# Patient Record
Sex: Female | Born: 1985 | Race: White | Hispanic: No | Marital: Married | Smoking: Current every day smoker
Health system: Southern US, Community
[De-identification: ages and names within clinical notes are randomized; demographics above are authoritative.]

## PROBLEM LIST (undated history)

## (undated) ENCOUNTER — Inpatient Hospital Stay (HOSPITAL_COMMUNITY): Payer: Self-pay

## (undated) ENCOUNTER — Emergency Department (HOSPITAL_BASED_OUTPATIENT_CLINIC_OR_DEPARTMENT_OTHER): Admission: EM | Payer: Medicaid Other | Source: Home / Self Care

## (undated) DIAGNOSIS — F319 Bipolar disorder, unspecified: Secondary | ICD-10-CM

## (undated) DIAGNOSIS — R51 Headache: Secondary | ICD-10-CM

## (undated) DIAGNOSIS — Z8632 Personal history of gestational diabetes: Secondary | ICD-10-CM

## (undated) DIAGNOSIS — Z8619 Personal history of other infectious and parasitic diseases: Secondary | ICD-10-CM

## (undated) DIAGNOSIS — O139 Gestational [pregnancy-induced] hypertension without significant proteinuria, unspecified trimester: Secondary | ICD-10-CM

## (undated) DIAGNOSIS — F419 Anxiety disorder, unspecified: Secondary | ICD-10-CM

## (undated) DIAGNOSIS — E669 Obesity, unspecified: Secondary | ICD-10-CM

## (undated) DIAGNOSIS — F32A Depression, unspecified: Secondary | ICD-10-CM

## (undated) DIAGNOSIS — I10 Essential (primary) hypertension: Secondary | ICD-10-CM

## (undated) DIAGNOSIS — N132 Hydronephrosis with renal and ureteral calculous obstruction: Secondary | ICD-10-CM

## (undated) DIAGNOSIS — N390 Urinary tract infection, site not specified: Secondary | ICD-10-CM

## (undated) DIAGNOSIS — N10 Acute tubulo-interstitial nephritis: Secondary | ICD-10-CM

## (undated) DIAGNOSIS — Z8759 Personal history of other complications of pregnancy, childbirth and the puerperium: Secondary | ICD-10-CM

## (undated) DIAGNOSIS — K589 Irritable bowel syndrome without diarrhea: Secondary | ICD-10-CM

## (undated) DIAGNOSIS — A419 Sepsis, unspecified organism: Secondary | ICD-10-CM

## (undated) DIAGNOSIS — F329 Major depressive disorder, single episode, unspecified: Secondary | ICD-10-CM

## (undated) HISTORY — PX: COLONOSCOPY: SHX174

## (undated) HISTORY — PX: TOOTH EXTRACTION: SUR596

## (undated) HISTORY — DX: Gestational (pregnancy-induced) hypertension without significant proteinuria, unspecified trimester: O13.9

## (undated) HISTORY — PX: GASTRIC BYPASS: SHX52

## (undated) HISTORY — DX: Personal history of other infectious and parasitic diseases: Z86.19

## (undated) HISTORY — DX: Essential (primary) hypertension: I10

## (undated) HISTORY — DX: Headache: R51

## (undated) HISTORY — DX: Anxiety disorder, unspecified: F41.9

---
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
## 2009-07-28 ENCOUNTER — Ambulatory Visit: Payer: Self-pay | Admitting: Physician Assistant

## 2009-07-28 ENCOUNTER — Inpatient Hospital Stay (HOSPITAL_COMMUNITY): Admission: AD | Admit: 2009-07-28 | Discharge: 2009-07-28 | Payer: Self-pay | Admitting: Obstetrics and Gynecology

## 2009-09-25 ENCOUNTER — Encounter: Admission: RE | Admit: 2009-09-25 | Discharge: 2009-12-05 | Payer: Self-pay | Admitting: Certified Nurse Midwife

## 2009-10-26 ENCOUNTER — Inpatient Hospital Stay (HOSPITAL_COMMUNITY): Admission: AD | Admit: 2009-10-26 | Discharge: 2009-10-27 | Payer: Self-pay | Admitting: Obstetrics and Gynecology

## 2009-10-26 ENCOUNTER — Ambulatory Visit: Payer: Self-pay | Admitting: Physician Assistant

## 2009-12-15 ENCOUNTER — Inpatient Hospital Stay (HOSPITAL_COMMUNITY): Admission: AD | Admit: 2009-12-15 | Discharge: 2009-12-15 | Payer: Self-pay | Admitting: Obstetrics

## 2009-12-23 ENCOUNTER — Inpatient Hospital Stay (HOSPITAL_COMMUNITY): Admission: AD | Admit: 2009-12-23 | Discharge: 2009-12-25 | Payer: Self-pay | Admitting: Obstetrics

## 2010-05-21 LAB — CBC
HCT: 32.4 % — ABNORMAL LOW (ref 36.0–46.0)
HCT: 38.4 % (ref 36.0–46.0)
Hemoglobin: 11.1 g/dL — ABNORMAL LOW (ref 12.0–15.0)
Hemoglobin: 13.2 g/dL (ref 12.0–15.0)
MCH: 31.2 pg (ref 26.0–34.0)
MCH: 31.5 pg (ref 26.0–34.0)
MCHC: 34.3 g/dL (ref 30.0–36.0)
MCHC: 34.5 g/dL (ref 30.0–36.0)
MCV: 90.5 fL (ref 78.0–100.0)
MCV: 91.7 fL (ref 78.0–100.0)
Platelets: 217 10*3/uL (ref 150–400)
Platelets: 248 10*3/uL (ref 150–400)
RBC: 3.53 MIL/uL — ABNORMAL LOW (ref 3.87–5.11)
RBC: 4.24 MIL/uL (ref 3.87–5.11)
RDW: 14.4 % (ref 11.5–15.5)
RDW: 14.6 % (ref 11.5–15.5)
WBC: 12.9 10*3/uL — ABNORMAL HIGH (ref 4.0–10.5)
WBC: 19.8 10*3/uL — ABNORMAL HIGH (ref 4.0–10.5)

## 2010-05-21 LAB — COMPREHENSIVE METABOLIC PANEL
ALT: 12 U/L (ref 0–35)
ALT: 14 U/L (ref 0–35)
AST: 18 U/L (ref 0–37)
AST: 22 U/L (ref 0–37)
Albumin: 2.1 g/dL — ABNORMAL LOW (ref 3.5–5.2)
Albumin: 2.5 g/dL — ABNORMAL LOW (ref 3.5–5.2)
Alkaline Phosphatase: 108 U/L (ref 39–117)
Alkaline Phosphatase: 136 U/L — ABNORMAL HIGH (ref 39–117)
BUN: 10 mg/dL (ref 6–23)
BUN: 9 mg/dL (ref 6–23)
CO2: 20 mEq/L (ref 19–32)
CO2: 23 mEq/L (ref 19–32)
Calcium: 8.8 mg/dL (ref 8.4–10.5)
Calcium: 9.4 mg/dL (ref 8.4–10.5)
Chloride: 105 mEq/L (ref 96–112)
Chloride: 106 mEq/L (ref 96–112)
Creatinine, Ser: 0.49 mg/dL (ref 0.4–1.2)
Creatinine, Ser: 0.58 mg/dL (ref 0.4–1.2)
GFR calc Af Amer: >60 mL/min (ref >60)
GFR calc Af Amer: >60 mL/min (ref >60)
GFR calc non Af Amer: >60 mL/min (ref >60)
GFR calc non Af Amer: >60 mL/min (ref >60)
Glucose, Bld: 79 mg/dL (ref 70–99)
Glucose, Bld: 92 mg/dL (ref 70–99)
Potassium: 3.9 mEq/L (ref 3.5–5.1)
Potassium: 4.3 mEq/L (ref 3.5–5.1)
Sodium: 135 mEq/L (ref 135–145)
Sodium: 136 mEq/L (ref 135–145)
Total Bilirubin: 0.3 mg/dL (ref 0.3–1.2)
Total Bilirubin: 0.3 mg/dL (ref 0.3–1.2)
Total Protein: 5.3 g/dL — ABNORMAL LOW (ref 6.0–8.3)
Total Protein: 5.9 g/dL — ABNORMAL LOW (ref 6.0–8.3)

## 2010-05-21 LAB — ABO/RH: ABO/RH(D): O POS

## 2010-05-21 LAB — URIC ACID
Uric Acid, Serum: 5.6 mg/dL (ref 2.4–7.0)
Uric Acid, Serum: 5.8 mg/dL (ref 2.4–7.0)

## 2010-05-21 LAB — RPR: RPR Ser Ql: NONREACTIVE

## 2010-05-21 LAB — LACTATE DEHYDROGENASE: LDH: 141 U/L (ref 94–250)

## 2010-05-22 LAB — GLUCOSE, CAPILLARY: Glucose-Capillary: 111 mg/dL — ABNORMAL HIGH (ref 70–99)

## 2010-05-26 LAB — URINALYSIS, ROUTINE W REFLEX MICROSCOPIC
Glucose, UA: NEGATIVE mg/dL
Ketones, ur: NEGATIVE mg/dL
Leukocytes, UA: NEGATIVE
Nitrite: NEGATIVE
Protein, ur: 100 mg/dL — AB
Specific Gravity, Urine: >1.03 — ABNORMAL HIGH (ref 1.005–1.030)
Urobilinogen, UA: 0.2 mg/dL (ref 0.0–1.0)
pH: 5.5 (ref 5.0–8.0)

## 2010-05-26 LAB — URINE MICROSCOPIC-ADD ON

## 2010-05-26 LAB — URINE CULTURE: Colony Count: 75000

## 2011-03-10 NOTE — L&D Delivery Note (Signed)
Delivery Note  Complete dilation at 2339 Onset of pushing at 2340 FHR second stage 150s  Analgesia /Anesthesia intrapartum: none  Delivery of a viable female at 2345 by CNM in LOA position.  Nuchal Cord - none. Cord double clamped after cessation of pulsation, cut by FOB.  Cord blood sample collected.  Placenta delivered spontaneous via shultz  intact with 3 VC.  Placenta for disposal. Uterine tone firm / small bleeding   no laceration identified  Est. Blood Loss (mL): 300 ml  Complications: none  Mom to postpartum.  Baby to Mom for bonding.  [name] CNM, MSN 11/06/2011, 12:03 AM

## 2011-03-23 ENCOUNTER — Encounter (HOSPITAL_COMMUNITY): Payer: Self-pay | Admitting: *Deleted

## 2011-03-23 ENCOUNTER — Inpatient Hospital Stay (HOSPITAL_COMMUNITY)
Admission: AD | Admit: 2011-03-23 | Discharge: 2011-03-24 | Disposition: A | Discharge status: Home / Self Care | Payer: 59 | Source: Ambulatory Visit | Attending: Obstetrics and Gynecology | Admitting: Obstetrics and Gynecology

## 2011-03-23 DIAGNOSIS — O26899 Other specified pregnancy related conditions, unspecified trimester: Secondary | ICD-10-CM

## 2011-03-23 DIAGNOSIS — O99891 Other specified diseases and conditions complicating pregnancy: Secondary | ICD-10-CM | POA: Insufficient documentation

## 2011-03-23 DIAGNOSIS — R1031 Right lower quadrant pain: Secondary | ICD-10-CM | POA: Insufficient documentation

## 2011-03-23 LAB — URINE MICROSCOPIC-ADD ON

## 2011-03-23 LAB — URINALYSIS, ROUTINE W REFLEX MICROSCOPIC
Bilirubin Urine: NEGATIVE
Glucose, UA: NEGATIVE mg/dL
Ketones, ur: NEGATIVE mg/dL
Leukocytes, UA: NEGATIVE
Nitrite: NEGATIVE
Protein, ur: NEGATIVE mg/dL
Specific Gravity, Urine: >1.03 — ABNORMAL HIGH (ref 1.005–1.030)
Urobilinogen, UA: 0.2 mg/dL (ref 0.0–1.0)
pH: 6 (ref 5.0–8.0)

## 2011-03-23 LAB — URINE CULTURE
Colony Count: >=100000
Culture  Setup Time: 201301150323

## 2011-03-23 LAB — HCG, QUANTITATIVE, PREGNANCY: hCG, Beta Chain, Quant, S: 418 m[IU]/mL — ABNORMAL HIGH (ref <5)

## 2011-03-23 NOTE — Progress Notes (Signed)
VE done per CNM.  cx closed.

## 2011-03-23 NOTE — Progress Notes (Signed)
Pt sttes she has pain on the rt lower side of her abd that extends to the back-present x 1 week

## 2011-03-23 NOTE — Progress Notes (Signed)
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

## 2011-03-23 NOTE — ED Notes (Signed)
Family hx of kidney stones

## 2011-03-23 NOTE — Progress Notes (Signed)
Colon Flattery, CNM at bedside.  Assessment done and poc discussed with pt.

## 2011-03-24 NOTE — ED Provider Notes (Signed)
History   Amber Nielsen is a 26 y.o. female G49P1011 with newly diagnosed pregnancy, [redacted]w[redacted]d EGA by LMP, followed with serial quants for hx MAB. Quant today 300+, good rise since previous one at 50.  C/O pain in RLQ, intermittent throughout the day, worse after completing shift at work. Describes pain as coming and going, burning, radiates to back and upper thigh; currently not present. Denies VB/discharge. No urinary S/S. No personal hx of kidney stones, (+) FHx of kidney stones.  She is on Prometrium supplement for low progesterone level.  Chief Complaint  Patient presents with  . Abdominal Pain   HPI  OB History    Grav Para Term Preterm Abortions TAB SAB Ect Mult Living   3 1 1  1  1   1       Past Medical History  Diagnosis Date  . Asthma     Past Surgical History  Procedure Date  . Tooth extraction     No family history on file.  History  Substance Use Topics  . Smoking status: Never Smoker   . Smokeless tobacco: Not on file  . Alcohol Use: No    Allergies:  Allergies  Allergen Reactions  . Celery Oil Swelling    Tongue swelling when eating celery    Prescriptions prior to admission  Medication Sig Dispense Refill  . Prenatal Vit-Fe Fumarate-FA (PRENATAL MULTIVITAMIN) TABS Take 1 tablet by mouth daily.      . progesterone (PROMETRIUM) 200 MG capsule Take 200 mg by mouth daily.        ROS Physical Exam   Blood pressure 112/51, pulse 94, temperature 98.1 F (36.7 C), temperature source Oral, resp. rate 18, height 5\' 10"  (1.778 m), weight 116.121 kg (256 lb), SpO2 99.00%.  Physical Exam  Constitutional: She is oriented to person, place, and time. She appears well-developed and well-nourished. No distress.  HENT:  Head: Normocephalic.  Eyes: Pupils are equal, round, and reactive to light.  Neck: Normal range of motion.  Cardiovascular: Normal rate and regular rhythm.   Respiratory: Effort normal and breath sounds normal.  GI: Soft. Bowel sounds are  normal. She exhibits no distension and no mass. There is no tenderness. There is no rebound and no guarding.  Genitourinary: Vagina normal. Uterus is enlarged (mild). Uterus is not tender. Cervix exhibits no motion tenderness and no discharge. Right adnexum displays no mass. Left adnexum displays no mass. No bleeding around the vagina. No vaginal discharge found.  Musculoskeletal: Normal range of motion.  Neurological: She is alert and oriented to person, place, and time. She has normal reflexes.  Skin: Skin is warm and dry.  Psychiatric: She has a normal mood and affect.    MAU Course  Procedures  Results for orders placed during the hospital encounter of 03/23/11 (from the past 24 hour(s))  URINALYSIS, ROUTINE W REFLEX MICROSCOPIC     Status: Abnormal   Collection Time   03/23/11 10:05 PM      Component Value Range   Color, Urine YELLOW  YELLOW    APPearance CLEAR  CLEAR    Specific Gravity, Urine >1.030 (*) 1.005 - 1.030    pH 6.0  5.0 - 8.0    Glucose, UA NEGATIVE  NEGATIVE (mg/dL)   Hgb urine dipstick TRACE (*) NEGATIVE    Bilirubin Urine NEGATIVE  NEGATIVE    Ketones, ur NEGATIVE  NEGATIVE (mg/dL)   Protein, ur NEGATIVE  NEGATIVE (mg/dL)   Urobilinogen, UA 0.2  0.0 - 1.0 (  mg/dL)   Nitrite NEGATIVE  NEGATIVE    Leukocytes, UA NEGATIVE  NEGATIVE   URINE MICROSCOPIC-ADD ON     Status: Normal   Collection Time   03/23/11 10:05 PM      Component Value Range   Squamous Epithelial / LPF RARE  RARE    RBC / HPF 0-2  <3 (RBC/hpf)   Bacteria, UA RARE  RARE    Urine-Other MUCOUS PRESENT    HCG, QUANTITATIVE, PREGNANCY     Status: Abnormal   Collection Time   03/23/11 11:15 PM      Component Value Range   hCG, Beta Chain, Quant, S 418 (*) <5 (mIU/mL)    Assessment and Plan  IUP at [redacted]w[redacted]d with hx RLQ pain, none elicited at this time. Suspect pain likely d/t CL UA with increased spec gravity and trace hgb, low risk of kidney stones, UCX pending  Will d/c home with SAB precautions,  advised to increase PO fluids, can add Tylenol for discomfort. F/U in office as scheduled, has sono planned for later this week.   PAUL,DANIELA 03/24/2011, 12:01 AM

## 2011-04-15 LAB — OB RESULTS CONSOLE ABO/RH: RH Type: POSITIVE

## 2011-04-15 LAB — OB RESULTS CONSOLE RUBELLA ANTIBODY, IGM: Rubella: UNDETERMINED

## 2011-04-15 LAB — OB RESULTS CONSOLE HEPATITIS B SURFACE ANTIGEN: Hepatitis B Surface Ag: NEGATIVE

## 2011-04-15 LAB — OB RESULTS CONSOLE HIV ANTIBODY (ROUTINE TESTING): HIV: NONREACTIVE

## 2011-04-15 LAB — OB RESULTS CONSOLE ANTIBODY SCREEN: Antibody Screen: NEGATIVE

## 2011-04-25 ENCOUNTER — Inpatient Hospital Stay (HOSPITAL_COMMUNITY)
Admission: AD | Admit: 2011-04-25 | Discharge: 2011-04-26 | Disposition: A | Discharge status: Home / Self Care | Payer: 59 | Source: Ambulatory Visit | Attending: Obstetrics and Gynecology | Admitting: Obstetrics and Gynecology

## 2011-04-25 ENCOUNTER — Encounter (HOSPITAL_COMMUNITY): Payer: Self-pay

## 2011-04-25 DIAGNOSIS — O99891 Other specified diseases and conditions complicating pregnancy: Secondary | ICD-10-CM | POA: Insufficient documentation

## 2011-04-25 DIAGNOSIS — J111 Influenza due to unidentified influenza virus with other respiratory manifestations: Secondary | ICD-10-CM | POA: Insufficient documentation

## 2011-04-25 DIAGNOSIS — O210 Mild hyperemesis gravidarum: Secondary | ICD-10-CM | POA: Insufficient documentation

## 2011-04-25 MED ORDER — ACETAMINOPHEN 500 MG PO TABS
1000.0000 mg | ORAL_TABLET | Freq: Four times a day (QID) | ORAL | Status: DC | PRN
  Administered 2011-04-26: 1000 mg via ORAL
  Filled 2011-04-25: qty 2

## 2011-04-25 MED ORDER — OSELTAMIVIR PHOSPHATE 75 MG PO CAPS
75.0000 mg | ORAL_CAPSULE | Freq: Two times a day (BID) | ORAL | Status: DC
  Administered 2011-04-26: 75 mg via ORAL
  Filled 2011-04-25 (×2): qty 1

## 2011-04-25 MED ORDER — ONDANSETRON 8 MG PO TBDP
8.0000 mg | ORAL_TABLET | Freq: Three times a day (TID) | ORAL | Status: DC | PRN
Start: 2011-04-25 — End: 2011-04-26
  Filled 2011-04-25: qty 1

## 2011-04-25 NOTE — Progress Notes (Signed)
Patient is here with c/o lower back pain, sore throat, vomiting and fever. She denies any vaginal bleeding or discharge

## 2011-04-25 NOTE — ED Provider Notes (Signed)
History   Amber Nielsen is a 26 y.o. G3P1011 at [redacted]w[redacted]d , presenting w/ c/o body aches, fever, nausea and emesis since this am. No cough.  Has not taken any OTC meds. Reports spouse also sick at home.  Denies VB/LOF.  Chief Complaint  Patient presents with  . Sore Throat  . Emesis  . Fever     OB History    Grav Para Term Preterm Abortions TAB SAB Ect Mult Living   3 1 1  1  1   1       Past Medical History  Diagnosis Date  . Asthma     allergy related. rodent animals    Past Surgical History  Procedure Date  . Tooth extraction     History reviewed. No pertinent family history.  History  Substance Use Topics  . Smoking status: Never Smoker   . Smokeless tobacco: Not on file  . Alcohol Use: No    Allergies:  Allergies  Allergen Reactions  . Celery Oil Swelling    Tongue swelling when eating celery    Prescriptions prior to admission  Medication Sig Dispense Refill  . Prenatal Vit-Fe Fumarate-FA (PRENATAL MULTIVITAMIN) TABS Take 1 tablet by mouth daily.      . progesterone (PROMETRIUM) 200 MG capsule Take 200 mg by mouth daily.        ROS as noted Physical Exam   Gen: AAO x 3, NAD, flushed Skin: flushed, moist mucous membranes CV: RRR, sinus tachy 110 Pulm; CTAB Abd: NT, soft Pelvic: deferred Ext: no edema  FHT 180 by Doppler  Blood pressure 119/60, pulse 115, temperature 101.1 F (38.4 C), temperature source Oral, resp. rate 20, height 5' 10.5" (1.791 m), weight 255 lb 8 oz (115.894 kg).    ED Course   IUP at [redacted]w[redacted]d  Flu-like symptoms, will treat presumptively Flu swab pending, will result on Monday  Start Tamiflu 75 mg PO BID x 5 days Zofran and Tylenol PRN Will D/C home if able to tolerate PO fluids and fever down.   Amber Nielsen 04/26/2011 12:05 AM

## 2011-04-25 NOTE — Progress Notes (Signed)
Pt states, " The sore throat started yesterday and this morning I felt nauseated but I went to work and then I started vomiting. I've been cramping all day in my upper abdomen. I took my temp a couple hours ago and it was 101."

## 2011-04-26 LAB — INFLUENZA PANEL BY PCR (TYPE A & B)
H1N1 flu by pcr: NOT DETECTED
Influenza A By PCR: NEGATIVE
Influenza B By PCR: NEGATIVE

## 2011-04-26 MED ORDER — ACETAMINOPHEN 500 MG PO TABS: 1000.0000 mg | ORAL_TABLET | Freq: Four times a day (QID) | ORAL | Status: AC | PRN

## 2011-04-26 MED ORDER — OSELTAMIVIR PHOSPHATE 75 MG PO CAPS: 75.0000 mg | ORAL_CAPSULE | Freq: Two times a day (BID) | ORAL | Status: AC

## 2011-04-26 MED ORDER — ONDANSETRON 8 MG PO TBDP: 8.0000 mg | ORAL_TABLET | Freq: Three times a day (TID) | ORAL | Status: AC | PRN

## 2011-07-16 ENCOUNTER — Encounter (HOSPITAL_COMMUNITY): Payer: Self-pay | Admitting: *Deleted

## 2011-07-16 ENCOUNTER — Inpatient Hospital Stay (HOSPITAL_COMMUNITY)
Admission: AD | Admit: 2011-07-16 | Discharge: 2011-07-16 | Disposition: A | Discharge status: Home / Self Care | Payer: No Typology Code available for payment source | Source: Ambulatory Visit | Attending: Obstetrics | Admitting: Obstetrics

## 2011-07-16 DIAGNOSIS — M25519 Pain in unspecified shoulder: Secondary | ICD-10-CM | POA: Insufficient documentation

## 2011-07-16 DIAGNOSIS — O99891 Other specified diseases and conditions complicating pregnancy: Secondary | ICD-10-CM | POA: Insufficient documentation

## 2011-07-16 MED ORDER — ACETAMINOPHEN 500 MG PO TABS
1000.0000 mg | ORAL_TABLET | Freq: Four times a day (QID) | ORAL | Status: DC | PRN
  Administered 2011-07-16: 1000 mg via ORAL
  Filled 2011-07-16: qty 2

## 2011-07-16 MED ORDER — ACETAMINOPHEN 500 MG PO TABS: 1000.0000 mg | ORAL_TABLET | Freq: Four times a day (QID) | ORAL | Status: AC | PRN
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
## 2011-08-15 ENCOUNTER — Inpatient Hospital Stay (HOSPITAL_COMMUNITY): Payer: 59

## 2011-08-15 ENCOUNTER — Encounter (HOSPITAL_COMMUNITY): Payer: Self-pay | Admitting: *Deleted

## 2011-08-15 ENCOUNTER — Inpatient Hospital Stay (HOSPITAL_COMMUNITY)
Admission: AD | Admit: 2011-08-15 | Discharge: 2011-08-15 | Disposition: A | Discharge status: Home / Self Care | Payer: 59 | Source: Ambulatory Visit | Attending: Obstetrics and Gynecology | Admitting: Obstetrics and Gynecology

## 2011-08-15 DIAGNOSIS — O4690 Antepartum hemorrhage, unspecified, unspecified trimester: Secondary | ICD-10-CM | POA: Insufficient documentation

## 2011-08-15 LAB — WET PREP, GENITAL
Clue Cells Wet Prep HPF POC: NONE SEEN
Trich, Wet Prep: NONE SEEN
Yeast Wet Prep HPF POC: NONE SEEN

## 2011-08-15 LAB — URINALYSIS, ROUTINE W REFLEX MICROSCOPIC
Bilirubin Urine: NEGATIVE
Glucose, UA: NEGATIVE mg/dL
Hgb urine dipstick: NEGATIVE
Ketones, ur: NEGATIVE mg/dL
Nitrite: NEGATIVE
Protein, ur: NEGATIVE mg/dL
Specific Gravity, Urine: 1.02 (ref 1.005–1.030)
Urobilinogen, UA: 0.2 mg/dL (ref 0.0–1.0)
pH: 6 (ref 5.0–8.0)

## 2011-08-15 LAB — URINE MICROSCOPIC-ADD ON

## 2011-08-15 NOTE — MAU Note (Signed)
Pt states she has had bleeding this am, describes it as bloody mucus when she wipes. Denies any cramping.

## 2011-08-15 NOTE — MAU Note (Signed)
Patient reports when wiped after using the bathroom had brown red blood on tissue having a lot of discharge [redacted] weeks gestation

## 2011-08-15 NOTE — MAU Provider Note (Signed)
OB ADMISSION/ HISTORY & PHYSICAL:  Admission Date: 08/15/2011 12:54 PM  Admit Diagnosis: Intrauterine pregnancy at 25 3/7 weeks Vaginal bleed   Amber Nielsen is a 27 y.o. female presenting for eval of vaginal bleed. Reports bright red and brown mucous DC on toilet paper this AM after void. Mild back pain since yesterday. Denies LOF / N/V/ abdominal pain. Denies urinary s/s. No hemorrhoids. No IC last 24 hours. No further spotting since first episode.  Prenatal History: G3P1011   EDC : 11/25/2011, by Other B[facility]  Prenatal care at [facility] since first trimester, Fredric Mare, CNM primary.  Prenatal course complicated by failed 1GTT with hx of GDM, now on BS monitor / FBS and one 2 hr PP daily Obesity, gallbladder colic intermittent    Medical / Surgical History :  Past medical history:  Past Medical History  Diagnosis Date  . Asthma     allergy related. rodent animals     Past surgical history:  Past Surgical History  Procedure Date  . Tooth extraction      Family History:  Family History  Problem Relation Age of Onset  . Heart disease Father      Social History:  reports that she has never smoked. She does not have any smokeless tobacco history on file. She reports that she does not drink alcohol or use illicit drugs.   Allergies: Celery oil    Current Medications at time of admission:  Prescriptions prior to admission  Medication Sig Dispense Refill  . OVER THE COUNTER MEDICATION Take 1 tablet by mouth 2 (two) times daily. Patient takes Pepcid AC      . Prenatal Vit-Fe Fumarate-FA (PRENATAL MULTIVITAMIN) TABS Take 1 tablet by mouth daily.          Review of Systems: As noted in HPI.   Physical Exam:    Filed Vitals:   08/15/11 1302 08/15/11 1550  BP: 117/66 124/72  Pulse: 97 82  Temp: 98.5 F (36.9 C)   TempSrc: Oral   Resp:  18  Height: 5\' 10"  (1.778 m)   Weight: 125.193 kg (276 lb)     General: AAO x3, NAD  Abdomen: obese,  non tender, gravid Extremities: trace pedal edema Genitalia / VE: SSE, no blood in vaginal vault, cervix appears normal, closed, ectropion +, leukorrhea DC  FHR: 130, moderate variability, no decel's TOCO: no ctx   Labs:    Results for orders placed during the hospital encounter of 08/15/11 (from the past 24 hour(s))  URINALYSIS, ROUTINE W REFLEX MICROSCOPIC     Status: Abnormal   Collection Time   08/15/11  1:05 PM      Component Value Range   Color, Urine YELLOW  YELLOW    APPearance HAZY (*) CLEAR    Specific Gravity, Urine 1.020  1.005 - 1.030    pH 6.0  5.0 - 8.0    Glucose, UA NEGATIVE  NEGATIVE (mg/dL)   Hgb urine dipstick NEGATIVE  NEGATIVE    Bilirubin Urine NEGATIVE  NEGATIVE    Ketones, ur NEGATIVE  NEGATIVE (mg/dL)   Protein, ur NEGATIVE  NEGATIVE (mg/dL)   Urobilinogen, UA 0.2  0.0 - 1.0 (mg/dL)   Nitrite NEGATIVE  NEGATIVE    Leukocytes, UA TRACE (*) NEGATIVE   URINE MICROSCOPIC-ADD ON     Status: Abnormal   Collection Time   08/15/11  1:05 PM      Component Value Range   Squamous Epithelial / LPF MANY (*) RARE  WBC, UA 3-6  <3 (WBC/hpf)   RBC / HPF 0-2  <3 (RBC/hpf)   Bacteria, UA FEW (*) RARE   WET PREP, GENITAL     Status: Abnormal   Collection Time   08/15/11  3:06 PM      Component Value Range   Yeast Wet Prep HPF POC NONE SEEN  NONE SEEN    Trich, Wet Prep NONE SEEN  NONE SEEN    Clue Cells Wet Prep HPF POC NONE SEEN  NONE SEEN    WBC, Wet Prep HPF POC FEW (*) NONE SEEN     Sono for cervical length 4.3, cephalic, nl AFi  Assessment: IUP at 25 3/7 weeks Reassuring FHT's, FHR tracing reassuring for gestational age No active vaginal bleed Nl cervical length on sono No evidence of vaginosis  Plan:  Will DC home w/ precautions F/U as scheduled in office.  Deniz Hannan 08/15/2011, 3:44 PM

## 2011-10-18 ENCOUNTER — Inpatient Hospital Stay (HOSPITAL_COMMUNITY)
Admission: AD | Admit: 2011-10-18 | Discharge: 2011-10-18 | Disposition: A | Discharge status: Home / Self Care | Payer: 59 | Source: Ambulatory Visit | Attending: Obstetrics | Admitting: Obstetrics

## 2011-10-18 ENCOUNTER — Encounter (HOSPITAL_COMMUNITY): Payer: Self-pay | Admitting: Obstetrics and Gynecology

## 2011-10-18 ENCOUNTER — Emergency Department (HOSPITAL_COMMUNITY)
Admission: EM | Admit: 2011-10-18 | Discharge: 2011-10-19 | Disposition: A | Discharge status: Home / Self Care | Payer: 59 | Attending: Emergency Medicine | Admitting: Emergency Medicine

## 2011-10-18 ENCOUNTER — Encounter (HOSPITAL_COMMUNITY): Payer: Self-pay | Admitting: Emergency Medicine

## 2011-10-18 DIAGNOSIS — O99810 Abnormal glucose complicating pregnancy: Secondary | ICD-10-CM | POA: Insufficient documentation

## 2011-10-18 DIAGNOSIS — M79622 Pain in left upper arm: Secondary | ICD-10-CM

## 2011-10-18 DIAGNOSIS — O99891 Other specified diseases and conditions complicating pregnancy: Secondary | ICD-10-CM | POA: Insufficient documentation

## 2011-10-18 DIAGNOSIS — M79609 Pain in unspecified limb: Secondary | ICD-10-CM | POA: Insufficient documentation

## 2011-10-18 DIAGNOSIS — N898 Other specified noninflammatory disorders of vagina: Secondary | ICD-10-CM

## 2011-10-18 DIAGNOSIS — E669 Obesity, unspecified: Secondary | ICD-10-CM | POA: Insufficient documentation

## 2011-10-18 DIAGNOSIS — E876 Hypokalemia: Secondary | ICD-10-CM

## 2011-10-18 HISTORY — DX: Major depressive disorder, single episode, unspecified: F32.9

## 2011-10-18 HISTORY — DX: Depression, unspecified: F32.A

## 2011-10-18 HISTORY — DX: Obesity, unspecified: E66.9

## 2011-10-18 LAB — WET PREP, GENITAL
Clue Cells Wet Prep HPF POC: NONE SEEN
Trich, Wet Prep: NONE SEEN
Yeast Wet Prep HPF POC: NONE SEEN

## 2011-10-18 LAB — CBC WITH DIFFERENTIAL/PLATELET
Basophils Absolute: 0 10*3/uL (ref 0.0–0.1)
Basophils Relative: 0 % (ref 0–1)
Eosinophils Absolute: 0 10*3/uL (ref 0.0–0.7)
Eosinophils Relative: 0 % (ref 0–5)
HCT: 37.7 % (ref 36.0–46.0)
Hemoglobin: 12.7 g/dL (ref 12.0–15.0)
Lymphocytes Relative: 16 % (ref 12–46)
Lymphs Abs: 2.3 10*3/uL (ref 0.7–4.0)
MCH: 30.2 pg (ref 26.0–34.0)
MCHC: 33.7 g/dL (ref 30.0–36.0)
MCV: 89.8 fL (ref 78.0–100.0)
Monocytes Absolute: 0.9 10*3/uL (ref 0.1–1.0)
Monocytes Relative: 6 % (ref 3–12)
Neutro Abs: 11 10*3/uL — ABNORMAL HIGH (ref 1.7–7.7)
Neutrophils Relative %: 77 % (ref 43–77)
Platelets: 278 10*3/uL (ref 150–400)
RBC: 4.2 MIL/uL (ref 3.87–5.11)
RDW: 13.6 % (ref 11.5–15.5)
WBC: 14.3 10*3/uL — ABNORMAL HIGH (ref 4.0–10.5)

## 2011-10-18 LAB — AMNISURE RUPTURE OF MEMBRANE (ROM) NOT AT ARMC: Amnisure ROM: NEGATIVE

## 2011-10-18 NOTE — MAU Provider Note (Signed)
OB ADMISSION/ HISTORY & PHYSICAL:  Admission Date: 10/18/2011 11:56 AM  Admit Diagnosis: Intrauterine pregnancy at [redacted]w[redacted]d  Vaginal discharge   Amber Nielsen is a 26 y.o. female presenting for eval of vaginal discharge. Increased wetness for past 2 days, reports soaking a panty liner yesterday, increased DC when whipping since then.  Good fetal movement, no VB. Denies urinary s/s Contractions decreased and only occasional since OOW.  Prenatal History: G3P1011   EDC : 11/25/2011, by Other Basis  Prenatal care at [facility] since first trimester, Fredric Mare, CNM primary.  Prenatal course complicated by:  GDM A2 on Metformin and glyburide Preterm cervical change on modified bed rest and Procardia Anxiety controlled w/ Zoloft and BuSpar Obesity, gallbladder colic intermittent    Medical / Surgical History :  Past medical history:  Past Medical History  Diagnosis Date  . Asthma     allergy related. rodent animals  . Obesity   . Gestational diabetes      Past surgical history:  Past Surgical History  Procedure Date  . Tooth extraction      Family History:  Family History  Problem Relation Age of Onset  . Heart disease Father   . Heart attack Father   . Nephrolithiasis Father   . Mental retardation Maternal Aunt   . Diabetes Maternal Grandmother   . Cancer Maternal Grandmother   . Diabetes Maternal Grandfather   . Diabetes Paternal Grandmother   . COPD Paternal Grandmother   . Diabetes Paternal Grandfather      Social History:  reports that she has never smoked. She does not have any smokeless tobacco history on file. She reports that she does not drink alcohol or use illicit drugs.   Allergies: Celery oil    Current Medications at time of admission:  Prescriptions prior to admission  Medication Sig Dispense Refill  . busPIRone (BUSPAR) 5 MG tablet Take 5 mg by mouth every 6 (six) hours as needed. For anxiety, can also take 2 tablets at  bedtime if needed      . famotidine (PEPCID AC MAXIMUM STRENGTH) 20 MG tablet Take 20 mg by mouth every morning.      . glyBURIDE-metformin (GLUCOVANCE) 1.25-250 MG per tablet Take 1 tablet by mouth 2 (two) times daily with a meal.      . NIFEdipine (PROCARDIA-XL/ADALAT-CC/NIFEDICAL-XL) 30 MG 24 hr tablet Take 30 mg by mouth daily.      . Prenatal Vit-Fe Fumarate-FA (PRENATAL MULTIVITAMIN) TABS Take 1 tablet by mouth daily.      . sertraline (ZOLOFT) 50 MG tablet Take 50 mg by mouth daily.          Review of Systems: As noted in HPI.   Physical Exam:  Dilation: 2 Effacement (%): 40 Station: -2 Exam by:: Arlan Organ, CNM Filed Vitals:   10/18/11 1206  BP: 134/75  Pulse: 88  Temp: 97.9 F (36.6 C)  TempSrc: Oral  Resp: 18  Height: 5\' 10"  (1.778 m)  Weight: 131.634 kg (290 lb 3.2 oz)    General: AAO x3, NAD  Abdomen: obese, non tender, gravid Extremities: trace pedal edema Genitalia / VE:  leukorrhea DC, amnisure neg, wet prep neg cvx unchanged since last check  FHR: 130, moderate variability, no decel's TOCO: no ctx   Labs:    Results for orders placed during the hospital encounter of 10/18/11 (from the past 24 hour(s))  WET PREP, GENITAL     Status: Abnormal   Collection Time  10/18/11 12:50 PM      Component Value Range   Yeast Wet Prep HPF POC NONE SEEN  NONE SEEN   Trich, Wet Prep NONE SEEN  NONE SEEN   Clue Cells Wet Prep HPF POC NONE SEEN  NONE SEEN   WBC, Wet Prep HPF POC FEW (*) NONE SEEN  AMNISURE RUPTURE OF MEMBRANE (ROM)     Status: Normal   Collection Time   10/18/11 12:50 PM      Component Value Range   Amnisure ROM NEGATIVE      Assessment: IUP at [redacted]w[redacted]d, normal leukorrhea  Reassuring FHT's, FHR reactive No evidence of ROM No evidence of vaginosis   Plan:  Will DC home w/ precautions F/U as scheduled in office.  Lucas Exline 10/18/2011, 1:55 PM

## 2011-10-18 NOTE — ED Provider Notes (Signed)
History     CSN: [id number]  Arrival date & time 10/18/11  2220   First MD Initiated Contact with Patient 10/18/11 2259      Chief Complaint  Patient presents with  . Chest Pain    (Consider location/radiation/quality/duration/timing/severity/associated sxs/prior treatment) HPI 26 year old female presents to emergency department complaining of 2 days of intermittent sharp pain to her left arm, neck and occasionally to her chest. Patient reports initially pain was just in her left upper arm, pain lasts anywhere from a few seconds to 30 minutes. Patient reports pain is a sharp crampy pain. She denies shortness breath associated with the pain, but has had shortness of breath over the last week which she is associated with her expanding belly and being pregnant. Patient has been taking Tylenol for the pain. Tonight she called the nurse line who recommended that she be seen in the emergency department for possible heart attack. Patient reports her father had a heart attack in his 74s related to painting a house and taking Tylenol cold medicine. He did not require a stent for his heart attack. Patient is a G3 P2 at 34 weeks. She has gestational diabetes. She was started on Procardia 3 weeks ago do to frequent contractions. Patient was seen earlier today which was hospital for vaginal discharge, at that time she did not complain of arm or chest pain.  Past Medical History  Diagnosis Date  . Asthma     allergy related. rodent animals  . Obesity   . Gestational diabetes   . Depression     Past Surgical History  Procedure Date  . Tooth extraction     Family History  Problem Relation Age of Onset  . Heart disease Father   . Heart attack Father   . Nephrolithiasis Father   . Mental retardation Maternal Aunt   . Diabetes Maternal Grandmother   . Cancer Maternal Grandmother   . Diabetes Maternal Grandfather   . Diabetes Paternal Grandmother   . COPD Paternal Grandmother   . Diabetes  Paternal Grandfather     History  Substance Use Topics  . Smoking status: Never Smoker   . Smokeless tobacco: Not on file  . Alcohol Use: No    OB History    Grav Para Term Preterm Abortions TAB SAB Ect Mult Living   3 1 1  1  1   1       Review of Systems  All other systems reviewed and are negative.    Allergies  Celery oil  Home Medications   Current Outpatient Rx  Name Route Sig Dispense Refill  . BUSPIRONE HCL 5 MG PO TABS Oral Take 5 mg by mouth every 6 (six) hours as needed. For anxiety, can also take 2 tablets at bedtime if needed    . FAMOTIDINE 20 MG PO TABS Oral Take 20 mg by mouth every morning.    Marland Kitchen GLYBURIDE-METFORMIN 1.25-250 MG PO TABS Oral Take 1 tablet by mouth 2 (two) times daily with a meal.    . NIFEDIPINE ER OSMOTIC 30 MG PO TB24 Oral Take 30 mg by mouth daily.    Marland Kitchen PRENATAL MULTIVITAMIN CH Oral Take 1 tablet by mouth daily.    . SERTRALINE HCL 50 MG PO TABS Oral Take 50 mg by mouth daily.      BP 129/88  Pulse 84  Temp 97.6 F (36.4 C) (Oral)  Resp 30  SpO2 97%  Physical Exam  Nursing note and vitals reviewed. Constitutional: She  is oriented to person, place, and time. She appears well-developed and well-nourished.  HENT:  Head: Normocephalic and atraumatic.  Nose: Nose normal.  Mouth/Throat: Oropharynx is clear and moist.  Eyes: Conjunctivae and EOM are normal. Pupils are equal, round, and reactive to light.  Neck: Normal range of motion. Neck supple. No JVD present. No tracheal deviation present. No thyromegaly present.  Cardiovascular: Normal rate, regular rhythm, normal heart sounds and intact distal pulses.  Exam reveals no gallop and no friction rub.   No murmur heard. Pulmonary/Chest: Effort normal and breath sounds normal. No stridor. No respiratory distress. She has no wheezes. She has no rales. She exhibits no tenderness.  Abdominal: Soft. Bowel sounds are normal. She exhibits no distension and no mass. There is no tenderness.  There is no rebound and no guarding.  Musculoskeletal: Normal range of motion. She exhibits tenderness (tender to palpation over left trapezius, deltoid, reproduces pain). She exhibits no edema.  Lymphadenopathy:    She has no cervical adenopathy.  Neurological: She is alert and oriented to person, place, and time. She exhibits normal muscle tone. Coordination normal.  Skin: Skin is warm and dry. No rash noted. No erythema. No pallor.  Psychiatric: She has a normal mood and affect. Her behavior is normal. Judgment and thought content normal.    ED Course  Procedures (including critical care time)  Labs Reviewed  CBC WITH DIFFERENTIAL - Abnormal; Notable for the following:    WBC 14.3 (*)     Neutro Abs 11.0 (*)     All other components within normal limits  BASIC METABOLIC PANEL - Abnormal; Notable for the following:    Sodium 134 (*)     Potassium 3.4 (*)     Glucose, Bld 127 (*)     Creatinine, Ser 0.43 (*)     All other components within normal limits  TROPONIN I    Date: 10/19/2011  Rate: 97   Rhythm: normal sinus rhythm and sinus arrhythmia  QRS Axis: normal  Intervals: normal  ST/T Wave abnormalities: normal  Conduction Disutrbances:none  Narrative Interpretation:   Old EKG Reviewed: none available    1. Left upper arm pain   2. Hypokalemia       MDM  26 yo female with left sided arm, shoulder pain.  Do not feels sxs are due to PE, ACS.  Will check baseline labs given family history of early acs.        Olivia Mackie, MD 10/19/11 [phone]

## 2011-10-18 NOTE — MAU Note (Signed)
"  I was seen this week in the office and had this clear watery d/c.  I wear pads all the time.  I have started soaking the Poise pads that I wear in about an hour since yesterday.  I have UC's every once in a while, but it's hard for me to tell while taking Procardia.  They get worse and tighter at night that it's just ridiculous.  (+) FM"

## 2011-10-18 NOTE — ED Notes (Signed)
Pt c/o intermittent left arm pain X 2 days, left neck and shoulder pain that started yesterday. Pt reports she felt nauseous today laid down then it went away, when she woke up pt started experiencing some intermittent left hand numbness. Pt reports when she gets the arm pain she sometimes get left sided under breast chest pain.

## 2011-10-18 NOTE — MAU Note (Signed)
Pt reports she is leaking clear fluid for several days and increased yesterday. Having occasional mild contractions and reports good fetal movment

## 2011-10-18 NOTE — ED Notes (Addendum)
Pt presents w/ left arm/shoulder pain x2 days - pain is reproducible to palpation. Pt concerned d/t an episode of numbness to left hand today - pt is  [redacted] wks pregnant G3P1. Pt in no acute distress at present, states pain in arm has improved since being in dept.

## 2011-10-18 NOTE — ED Notes (Addendum)
Pt is [redacted] weeks pregnant.  Reports intermittent pain in L upper arm, L shoulder, L side of neck, L chest and L upper back x 2 days. Denies any other symptoms.  Increased concern due to father having MI in his 11s.

## 2011-10-19 LAB — TROPONIN I: Troponin I: <0.3 ng/mL (ref <0.30)

## 2011-10-19 LAB — BASIC METABOLIC PANEL
BUN: 8 mg/dL (ref 6–23)
CO2: 21 mEq/L (ref 19–32)
Calcium: 9.7 mg/dL (ref 8.4–10.5)
Chloride: 98 mEq/L (ref 96–112)
Creatinine, Ser: 0.43 mg/dL — ABNORMAL LOW (ref 0.50–1.10)
GFR calc Af Amer: >90 mL/min (ref >90)
GFR calc non Af Amer: >90 mL/min (ref >90)
Glucose, Bld: 127 mg/dL — ABNORMAL HIGH (ref 70–99)
Potassium: 3.4 mEq/L — ABNORMAL LOW (ref 3.5–5.1)
Sodium: 134 mEq/L — ABNORMAL LOW (ref 135–145)

## 2011-10-19 NOTE — ED Notes (Signed)
Pt ambulating independently w/ steady gait on d/c in no acute distress, A&Ox4. D/c instructions reviewed w/ pt - pt denies any further questions or concerns at present.  

## 2011-10-20 LAB — OB RESULTS CONSOLE RPR: RPR: NONREACTIVE

## 2011-10-21 LAB — OB RESULTS CONSOLE GC/CHLAMYDIA
Chlamydia: NEGATIVE
Gonorrhea: NEGATIVE

## 2011-10-21 LAB — OB RESULTS CONSOLE GBS: GBS: NEGATIVE

## 2011-11-02 ENCOUNTER — Telehealth (HOSPITAL_COMMUNITY): Payer: Self-pay | Admitting: *Deleted

## 2011-11-02 ENCOUNTER — Encounter (HOSPITAL_COMMUNITY): Payer: Self-pay | Admitting: *Deleted

## 2011-11-02 NOTE — Telephone Encounter (Signed)
Preadmission screen  

## 2011-11-05 ENCOUNTER — Inpatient Hospital Stay (HOSPITAL_COMMUNITY)
Admission: AD | Admit: 2011-11-05 | Discharge: 2011-11-07 | DRG: 774 | Disposition: A | Discharge status: Home / Self Care | Payer: 59 | Source: Ambulatory Visit | Attending: Obstetrics | Admitting: Obstetrics

## 2011-11-05 ENCOUNTER — Encounter (HOSPITAL_COMMUNITY): Payer: Self-pay | Admitting: *Deleted

## 2011-11-05 DIAGNOSIS — F329 Major depressive disorder, single episode, unspecified: Secondary | ICD-10-CM | POA: Diagnosis present

## 2011-11-05 DIAGNOSIS — O99344 Other mental disorders complicating childbirth: Secondary | ICD-10-CM | POA: Diagnosis present

## 2011-11-05 DIAGNOSIS — O99814 Abnormal glucose complicating childbirth: Secondary | ICD-10-CM | POA: Diagnosis present

## 2011-11-05 DIAGNOSIS — O3660X0 Maternal care for excessive fetal growth, unspecified trimester, not applicable or unspecified: Secondary | ICD-10-CM | POA: Diagnosis present

## 2011-11-05 DIAGNOSIS — O1400 Mild to moderate pre-eclampsia, unspecified trimester: Secondary | ICD-10-CM

## 2011-11-05 DIAGNOSIS — F3289 Other specified depressive episodes: Secondary | ICD-10-CM | POA: Diagnosis present

## 2011-11-05 DIAGNOSIS — IMO0002 Reserved for concepts with insufficient information to code with codable children: Principal | ICD-10-CM | POA: Diagnosis present

## 2011-11-05 DIAGNOSIS — O24419 Gestational diabetes mellitus in pregnancy, unspecified control: Secondary | ICD-10-CM

## 2011-11-05 DIAGNOSIS — E669 Obesity, unspecified: Secondary | ICD-10-CM | POA: Diagnosis present

## 2011-11-05 LAB — COMPREHENSIVE METABOLIC PANEL
ALT: 26 U/L (ref 0–35)
AST: 24 U/L (ref 0–37)
Albumin: 2.4 g/dL — ABNORMAL LOW (ref 3.5–5.2)
Alkaline Phosphatase: 153 U/L — ABNORMAL HIGH (ref 39–117)
BUN: 10 mg/dL (ref 6–23)
CO2: 21 mEq/L (ref 19–32)
Calcium: 9.5 mg/dL (ref 8.4–10.5)
Chloride: 99 mEq/L (ref 96–112)
Creatinine, Ser: 0.52 mg/dL (ref 0.50–1.10)
GFR calc Af Amer: >90 mL/min (ref >90)
GFR calc non Af Amer: >90 mL/min (ref >90)
Glucose, Bld: 127 mg/dL — ABNORMAL HIGH (ref 70–99)
Potassium: 4 mEq/L (ref 3.5–5.1)
Sodium: 135 mEq/L (ref 135–145)
Total Bilirubin: 0.2 mg/dL — ABNORMAL LOW (ref 0.3–1.2)
Total Protein: 6.4 g/dL (ref 6.0–8.3)

## 2011-11-05 LAB — CBC
HCT: 37.9 % (ref 36.0–46.0)
Hemoglobin: 12.2 g/dL (ref 12.0–15.0)
MCH: 29.1 pg (ref 26.0–34.0)
MCHC: 32.2 g/dL (ref 30.0–36.0)
MCV: 90.5 fL (ref 78.0–100.0)
Platelets: 269 10*3/uL (ref 150–400)
RBC: 4.19 MIL/uL (ref 3.87–5.11)
RDW: 14 % (ref 11.5–15.5)
WBC: 10.9 10*3/uL — ABNORMAL HIGH (ref 4.0–10.5)

## 2011-11-05 LAB — TYPE AND SCREEN
ABO/RH(D): O POS
Antibody Screen: NEGATIVE

## 2011-11-05 LAB — URIC ACID: Uric Acid, Serum: 5.7 mg/dL (ref 2.4–7.0)

## 2011-11-05 LAB — LACTATE DEHYDROGENASE: LDH: 213 U/L (ref 94–250)

## 2011-11-05 MED ORDER — ACETAMINOPHEN 325 MG PO TABS: 650.0000 mg | ORAL_TABLET | ORAL | Status: DC | PRN

## 2011-11-05 MED ORDER — OXYTOCIN 40 UNITS IN LACTATED RINGERS INFUSION - SIMPLE MED
62.5000 mL/h | Freq: Once | INTRAVENOUS | Status: AC
  Administered 2011-11-05: 62.5 mL/h via INTRAVENOUS

## 2011-11-05 MED ORDER — LIDOCAINE HCL (PF) 1 % IJ SOLN
30.0000 mL | INTRAMUSCULAR | Status: DC | PRN
  Filled 2011-11-05: qty 30

## 2011-11-05 MED ORDER — IBUPROFEN 600 MG PO TABS
600.0000 mg | ORAL_TABLET | Freq: Four times a day (QID) | ORAL | Status: DC | PRN
  Administered 2011-11-06: 600 mg via ORAL
  Filled 2011-11-05: qty 1

## 2011-11-05 MED ORDER — LACTATED RINGERS IV SOLN
INTRAVENOUS | Status: DC
  Administered 2011-11-05: 19:00:00 via INTRAVENOUS

## 2011-11-05 MED ORDER — LACTATED RINGERS IV SOLN: 500.0000 mL | INTRAVENOUS | Status: DC | PRN

## 2011-11-05 MED ORDER — OXYTOCIN BOLUS FROM INFUSION
250.0000 mL | Freq: Once | INTRAVENOUS | Status: DC
  Filled 2011-11-05: qty 500

## 2011-11-05 MED ORDER — CITRIC ACID-SODIUM CITRATE 334-500 MG/5ML PO SOLN
30.0000 mL | ORAL | Status: DC | PRN
  Administered 2011-11-05: 30 mL via ORAL
  Filled 2011-11-05: qty 15

## 2011-11-05 MED ORDER — OXYTOCIN 40 UNITS IN LACTATED RINGERS INFUSION - SIMPLE MED
1.0000 m[IU]/min | INTRAVENOUS | Status: DC
  Administered 2011-11-05: 2 m[IU]/min via INTRAVENOUS
  Filled 2011-11-05: qty 1000

## 2011-11-05 NOTE — Progress Notes (Signed)
S: Feeling more ctx- back pain with ctx     Tolerating contractions well - planning epidural if labor not too fast      Intermittent headache x 2 days with blurry vision - none at present / some epigastric pain - last felt last night  O:  VS: Blood pressure 139/75, pulse 97, temperature 98.1 F (36.7 C), temperature source Oral, resp. rate 20, height 5\' 10"  (1.778 m), weight 298 lb (135.172 kg).        FHR : baseline 150 / variability moderate / accels + / decels none        Toco: contractions every 3-4 minutes / mild-moderate         Cervix : 4-5cm / 90% / vtx / -1 / BBOW / small show        Membranes: AROM - clear large amount        Negative GBS  A: active labor     FHR category 1     preeclampsia  P: PIH labs     active management of labor     magnesium sulfate at delivery / postpartum unless neuro symptoms or abnormal labs      [name] CNM, MSN 11/05/2011, 6:51 PM

## 2011-11-05 NOTE — H&P (Signed)
OB ADMISSION/ HISTORY & PHYSICAL:  Admission Date: 11/05/2011  5:51 PM  Admit Diagnosis: 37 weeks onset of labor / Preeclampsia / GDM-A2 / obesity  Amber Nielsen is a 26 y.o. female presenting for onset of labor  - sent from office at 4cm.  Prenatal History: G3P1011   EDC : 11/25/2011, by [facility]  Prenatal care at [facility]   Prenatal course complicated by obesity / HX macrosomia / mild asthma /  GDM-a2 / depression with anxiety features /  hypertension with development of preeclampsia  Prenatal Labs: ABO, Rh:   O positive Antibody:  negative Rubella:   indeterminant RPR:   NR HBsAg:   negative HIV:   NR GBS:   negative 1 hr Glucola : ABNORMAL 146 early GTT   Medical / Surgical History :  Past medical history:  Past Medical History  Diagnosis Date  . Asthma     allergy related. rodent animals  . Obesity   . Depression   . Anxiety   . Headache   . Gestational diabetes     diet controlled  . Pregnancy induced hypertension   . Proteinuria complicating pregnancy   . H/O varicella      Past surgical history:  Past Surgical History  Procedure Date  . Tooth extraction      Family History:  Family History  Problem Relation Age of Onset  . Heart disease Father   . Heart attack Father   . Nephrolithiasis Father   . Mental retardation Maternal Aunt   . Diabetes Maternal Grandmother   . Cancer Maternal Grandmother     ovarian and breast  . Diabetes Maternal Grandfather   . Diabetes Paternal Grandmother   . COPD Paternal Grandmother   . Hypertension Paternal Grandmother   . Diabetes Paternal Grandfather      Social History:  reports that she has never smoked. She does not have any smokeless tobacco history on file. She reports that she does not drink alcohol or use illicit drugs.   Allergies: Celery oil    Current Medications at time of admission:  Prenatal vitamin daily glyberide 1.25 / metformin 250 mg BID Procardia 30 XL  daily zoloft 50 mg daily  Review of Systems: Intermittent headache and blurred vision Some epigastric pain vs GERD symptoms Backache with ctx pain in lower back Contractions all day  no LOF Active FM today - decreased FM this week  Physical Exam: General: pleasant / NAD Heart:RR Lungs:clear / unlabored Abdomen: gravid / non-tender Extremities: 1+ edema dependent / DTR 2+  Genitalia / VE: Dilation: 4.5 Effacement (%): 90 Station: -1 Exam by:: [name] CNM  FHR: baseline 150 / moderate variability / + accels / no decels TOCO: ctx every 3-5 minutes  Assessment: 37 weeks - latent labor onset Negative GBS PEC - mild GDM-A2  LGA fetus ( EFW at 8 pounds)  Plan:  Admit Active management Check PIH labs - plan magnesium sulfate postpartum until diuresis Check BS every 4 hours in labor  [name] CNM, MSN 11/05/2011, 6:59 PM

## 2011-11-05 NOTE — H&P (Signed)
Chart reviewed, labs stable, agree with plan. Kaelee Pfeffer A. 11/05/2011 9:31 PM

## 2011-11-05 NOTE — Progress Notes (Signed)
S: Feeling same - maybe stronger ctx     Tolerating contractions well      No PIH signs  O:  VS: Blood pressure 134/84, pulse 91, temperature 99 F (37.2 C), temperature source Axillary, resp. rate 20, height 5\' 10"  (1.778 m), weight 298 lb (135.172 kg).        FHR : baseline 150 / variability moderate / accels + / decels none        Toco: contractions every 2-4 minutes / mild to moderate         Cervix : 5-6 cm / 90% / vtx / -1        Membranes: clear fluid / small show        Platelets stable / LE normal  A: active labor     FHR category 1  P: pitocin augmentation for active management   [name] CNM, MSN 11/05/2011, 9:02 PM

## 2011-11-05 NOTE — Progress Notes (Signed)
S: Feeling stronger ctx     Tolerating contractions well - declines epidural at this time - wants to try natural     Requests to try tub/shower first   O:  VS: Blood pressure 154/83, pulse 83, temperature 98.8 F (37.1 C), temperature source Oral, resp. rate 20, height 5\' 10"  (1.778 m), weight 135.172 kg (298 lb).        FHR : baseline 150 / variability moderate / accels + / decels none        Toco: contractions every 2-3 minutes / pitocin 4 mu/min         Cervix : 7-8 cm / 90 % vtx 0 station / ROT        Membranes: clear fluid with show  A: active labor labor     FHR category 1  P: assist to tub/shower for 30 minutes     recheck with urge to push or request for pain med/ epidural     anticipate short second stage  [name] CNM, MSN 11/05/2011, 11:03 PM

## 2011-11-06 ENCOUNTER — Inpatient Hospital Stay (HOSPITAL_COMMUNITY): Admission: RE | Admit: 2011-11-06 | Payer: No Typology Code available for payment source | Source: Ambulatory Visit

## 2011-11-06 ENCOUNTER — Encounter (HOSPITAL_COMMUNITY): Payer: Self-pay | Admitting: Certified Nurse Midwife

## 2011-11-06 LAB — RPR: RPR Ser Ql: NONREACTIVE

## 2011-11-06 LAB — CBC
HCT: 36 % (ref 36.0–46.0)
Hemoglobin: 12 g/dL (ref 12.0–15.0)
MCH: 30.2 pg (ref 26.0–34.0)
MCHC: 33.3 g/dL (ref 30.0–36.0)
MCV: 90.5 fL (ref 78.0–100.0)
Platelets: 238 10*3/uL (ref 150–400)
RBC: 3.98 MIL/uL (ref 3.87–5.11)
RDW: 14.4 % (ref 11.5–15.5)
WBC: 17.7 10*3/uL — ABNORMAL HIGH (ref 4.0–10.5)

## 2011-11-06 MED ORDER — PRENATAL MULTIVITAMIN CH
1.0000 | ORAL_TABLET | Freq: Every day | ORAL | Status: DC
  Administered 2011-11-06 – 2011-11-07 (×2): 1 via ORAL
  Filled 2011-11-06 (×2): qty 1

## 2011-11-06 MED ORDER — SODIUM CHLORIDE 0.9 % IV SOLN: 250.0000 mL | INTRAVENOUS | Status: DC | PRN

## 2011-11-06 MED ORDER — IBUPROFEN 600 MG PO TABS
600.0000 mg | ORAL_TABLET | Freq: Four times a day (QID) | ORAL | Status: DC
  Administered 2011-11-06 – 2011-11-07 (×5): 600 mg via ORAL
  Filled 2011-11-06 (×4): qty 1

## 2011-11-06 MED ORDER — LANOLIN HYDROUS EX OINT: TOPICAL_OINTMENT | CUTANEOUS | Status: DC | PRN

## 2011-11-06 MED ORDER — SODIUM CHLORIDE 0.9 % IJ SOLN: 3.0000 mL | INTRAMUSCULAR | Status: DC | PRN

## 2011-11-06 MED ORDER — WITCH HAZEL-GLYCERIN EX PADS: 1.0000 "application " | MEDICATED_PAD | CUTANEOUS | Status: DC | PRN

## 2011-11-06 MED ORDER — OXYCODONE-ACETAMINOPHEN 5-325 MG PO TABS
1.0000 | ORAL_TABLET | ORAL | Status: DC | PRN
  Administered 2011-11-06 (×2): 1 via ORAL
  Filled 2011-11-06 (×2): qty 1

## 2011-11-06 MED ORDER — BENZOCAINE-MENTHOL 20-0.5 % EX AERO
1.0000 "application " | INHALATION_SPRAY | CUTANEOUS | Status: DC | PRN
  Administered 2011-11-06: 1 via TOPICAL
  Filled 2011-11-06: qty 56

## 2011-11-06 MED ORDER — TETANUS-DIPHTH-ACELL PERTUSSIS 5-2.5-18.5 LF-MCG/0.5 IM SUSP: 0.5000 mL | Freq: Once | INTRAMUSCULAR | Status: DC

## 2011-11-06 MED ORDER — DIBUCAINE 1 % RE OINT: 1.0000 "application " | TOPICAL_OINTMENT | RECTAL | Status: DC | PRN

## 2011-11-06 MED ORDER — GI COCKTAIL ~~LOC~~
30.0000 mL | Freq: Once | ORAL | Status: DC
  Filled 2011-11-06: qty 30

## 2011-11-06 MED ORDER — SERTRALINE HCL 50 MG PO TABS
50.0000 mg | ORAL_TABLET | Freq: Every day | ORAL | Status: DC
  Administered 2011-11-06 – 2011-11-07 (×2): 50 mg via ORAL
  Filled 2011-11-06 (×2): qty 1

## 2011-11-06 MED ORDER — SODIUM CHLORIDE 0.9 % IJ SOLN: 3.0000 mL | Freq: Two times a day (BID) | INTRAMUSCULAR | Status: DC

## 2011-11-06 MED ORDER — BUSPIRONE HCL 5 MG PO TABS
5.0000 mg | ORAL_TABLET | Freq: Four times a day (QID) | ORAL | Status: DC | PRN
  Filled 2011-11-06: qty 1

## 2011-11-06 MED ORDER — FAMOTIDINE 20 MG PO TABS
20.0000 mg | ORAL_TABLET | Freq: Every day | ORAL | Status: DC
  Administered 2011-11-06 (×2): 20 mg via ORAL
  Filled 2011-11-06 (×2): qty 1

## 2011-11-06 MED ORDER — MEASLES, MUMPS & RUBELLA VAC ~~LOC~~ INJ
0.5000 mL | INJECTION | Freq: Once | SUBCUTANEOUS | Status: AC
  Administered 2011-11-07: 0.5 mL via SUBCUTANEOUS
  Filled 2011-11-06 (×2): qty 0.5

## 2011-11-06 NOTE — Progress Notes (Signed)
Patient ID: Amber Nielsen, female   DOB: 1985-05-30, 26 y.o.   MRN: [id number] PPD # 1  Subjective: Pt reports feeling sore and tired/C/O epigastric and mid back pain.  Denies HA, blurred vision, SOB, CP Pain controlled with ibuprofen, and planning to ask for percocet Tolerating po/ Voiding without problems/ No n/v Bleeding is moderate Newborn info:  Information for the patient's newborn:  Amber Nielsen Girl Minha [[id number]]  female Feeding: breast   Objective:  VS: Blood pressure 128/86, pulse 80, temperature 97.5 F , resp. rate 20  Basename 11/05/11 1805  WBC 10.9*  HGB 12.2  HCT 37.9  PLT 269    Blood type: --/--/O POS (08/29 2000) Rubella: Equivocal (02/06 0000)    Physical Exam:  General: A & O x 3  alert, cooperative and no distress CV: Regular rate and rhythm Resp: clear Abdomen: soft, nontender, normal bowel sounds Uterine Fundus: firm, below umbilicus, nontender Perineum: intact and mild edema Lochia: moderate Ext: edema +2 and Homans sign is negative, no sign of DVT   A/P: PPD # 1/ G4P2012/ S/P:spontaneous vaginal delivery Hx PEC and GDM (A2); labs in am; BP's stable since delivery Hx anxiety/stress, dx'ed during pregnancy; on zoloft and buspar prn; stable Doing well Continue routine post partum orders    Demetrius Revel, MSN, [facility] 11/06/2011, 8:51 AM

## 2011-11-07 LAB — COMPREHENSIVE METABOLIC PANEL
ALT: 23 U/L (ref 0–35)
AST: 24 U/L (ref 0–37)
Albumin: 2 g/dL — ABNORMAL LOW (ref 3.5–5.2)
Alkaline Phosphatase: 115 U/L (ref 39–117)
BUN: 9 mg/dL (ref 6–23)
CO2: 24 mEq/L (ref 19–32)
Calcium: 9 mg/dL (ref 8.4–10.5)
Chloride: 103 mEq/L (ref 96–112)
Creatinine, Ser: 0.47 mg/dL — ABNORMAL LOW (ref 0.50–1.10)
GFR calc Af Amer: >90 mL/min (ref >90)
GFR calc non Af Amer: >90 mL/min (ref >90)
Glucose, Bld: 88 mg/dL (ref 70–99)
Potassium: 4 mEq/L (ref 3.5–5.1)
Sodium: 138 mEq/L (ref 135–145)
Total Bilirubin: 0.1 mg/dL — ABNORMAL LOW (ref 0.3–1.2)
Total Protein: 5.9 g/dL — ABNORMAL LOW (ref 6.0–8.3)

## 2011-11-07 LAB — CBC
HCT: 35.5 % — ABNORMAL LOW (ref 36.0–46.0)
Hemoglobin: 11.5 g/dL — ABNORMAL LOW (ref 12.0–15.0)
MCH: 29.8 pg (ref 26.0–34.0)
MCHC: 32.4 g/dL (ref 30.0–36.0)
MCV: 92 fL (ref 78.0–100.0)
Platelets: 224 10*3/uL (ref 150–400)
RBC: 3.86 MIL/uL — ABNORMAL LOW (ref 3.87–5.11)
RDW: 14.6 % (ref 11.5–15.5)
WBC: 9.8 10*3/uL (ref 4.0–10.5)

## 2011-11-07 MED ORDER — OXYCODONE-ACETAMINOPHEN 5-325 MG PO TABS: 1.0000 | ORAL_TABLET | ORAL | Status: AC | PRN

## 2011-11-07 MED ORDER — IBUPROFEN 600 MG PO TABS: 600.0000 mg | ORAL_TABLET | Freq: Four times a day (QID) | ORAL | Status: AC

## 2011-11-07 NOTE — Progress Notes (Signed)
Patient ID: Amber Nielsen, female   DOB: [birth year], 26 y.o.   MRN: [id number]  PPD 2 SVD  S:  Reports feeling well             Tolerating po/ No nausea or vomiting             Bleeding is light             Pain controlled with motrin and percocet             Up ad lib / ambulatory  Newborn breast feeding     O:  A & O x 3              VS: Blood pressure 118/75, pulse 79, temperature 97.3 F (36.3 C), temperature source Oral, resp. rate 18, height 5\' 10"  (1.778 m), weight 135.172 kg (298 lb), SpO2 97.00%, unknown if currently breastfeeding.  LABS: WBC/Hgb/Hct/Plts:  9.8/11.5/35.5/224 (08/31 0500)  PIH labs normal - stable             FBS 88  I&O:   Not done by nursing staff  Lungs: Clear and unlabored  Heart: regular rate and rhythm / no mumurs  Abdomen: soft, non-tender, non-distended              Fundus: firm, non-tender, U-1  Perineum: no edema / intact  Lochia: light  Extremities: trace edema, no calf pain or tenderness    A: PPD # 2             GDM-A2 - delivered              Mild PEC - stable BP and labs             Depression on Zoloft 50 mg  Doing well - stable status  P:  Routine post partum orders  Discharge home             GTT at 6-12 weeks with hga1c             Continue zoloft 50 mg next 6 months - adjustment of dose if necessary  [name] CNM, MSN 11/07/2011, 9:56 AM

## 2011-11-07 NOTE — Discharge Summary (Signed)
Obstetric Discharge Summary  Reason for Admission: onset of labor - latent labor Admission DX: 37 weeks latent labor / Mild preeclampsia / GDM-A2 / Depression Prenatal Procedures: NST, ultrasound and serial PIH labs and 24 hour urine Intrapartum Procedures: spontaneous vaginal delivery Postpartum Procedures: tDap and MMR boosters Complications-Operative and Postpartum: none  Hemoglobin  Date Value Range Status  11/07/2011 11.5* 12.0 - 15.0 g/dL Final     HCT  Date Value Range Status  11/07/2011 35.5* 36.0 - 46.0 % Final    Physical Exam:  General: alert, cooperative and no distress Lochia: appropriate Uterine Fundus: firm Incision: perineum intact DVT Evaluation: No evidence of DVT seen on physical exam.  Discharge Diagnoses:  37 weeks normal vaginal delivery Mild preeclampsia  GDM-A2 delivered Depression - stable  Discharge Information: Date: 11/07/2011 Activity: pelvic rest Diet: low carb and low sodium Medications: PNV, Ibuprofen, Percocet and pepcid complete and zoloft 50 mg Condition: stable Instructions: refer to practice specific booklet Discharge to: home Follow-up Information    Follow up with [name], CNM. Schedule an appointment as soon as possible for a visit in 6 weeks.   Contact information:   [address] [place] [address] [phone]          Newborn Data: Live born female  Birth Weight: 8 lb 9.7 oz (3905 g) APGAR: 8, 9  Home with mother.  [name] 11/07/2011, 10:03 AM

## 2011-11-11 ENCOUNTER — Ambulatory Visit (HOSPITAL_COMMUNITY)
Admit: 2011-11-11 | Discharge: 2011-11-11 | Disposition: A | Discharge status: Home / Self Care | Payer: 59 | Attending: Certified Nurse Midwife | Admitting: Certified Nurse Midwife

## 2011-11-11 NOTE — Progress Notes (Addendum)
Adult Lactation Consultation Outpatient Visit Note  Patient Name: Amber Nielsen Date of Birth: Nov 01, 1985 Gestational Age at Delivery: Unknown Type of Delivery: Vaginal del on 11/05/2011                              Infant is 43 days old                              Birth weight =8-9 oz,                                                 D/c weight = 8-2 oz ( D/C - Sat. 8/31)                                                  1st Dr. Visit =8.0 oz                             Per mom on Sunday - baby was at Amber Nielsen office and had to call 911 and be transported to [facility] ER due to infant turning blue, ( per mom questionably choking or seizure activity ) seizure activity ruled out after 24 observation.  Breastfeeding History:  Per mom started using the SNS in the Nielsen due to History of low milk supply with 1st baby ( Per mom 3 weeks , also milk came in but the fullness was not present compared to this baby. Mom also has had GDM , and PIH with this pregnancy , B/P has been ok since delivery. Due to swelling was prescribed Diuretic yesterday ( mom did not know the name for 14 days) . LC recommended to mom to call Amber Nielsen CNM and see if she has to be on it 14 days due to her hx of Low milk supply) .  Milk came in this passed Sunday 9/1, used hand pump until Tuesday when she received her Nielsen grade pump she bought ( Hygea DEBP)  Frequency of Breastfeeding: in 24 hours day - Exclusively breast = 10 X's ,BF with SNS 3-4 x's per day ( 2 oz )  Length of Feeding:  Voids: 8-9  Stools: 1-2 brownish - yellow stool   Supplementing / Method: Pumping:  Type of Pump:hand pump until yesterday occasional) , Tuesday - since used once 15 mins. ( pumped only one side)    Frequency:  Volume:  5 ml left breast   Comments: LC stressed the importance of consistent pumping along with feedings with the SNS at the breast.     Consultation Evaluation: LC assessed breast prior to feeding. Noted wide space  between both breast, and both to be under developed ,( right greater that left ) ( mom reports she had more breast changes with this pregnancy ,some change in size and soreness. Both breast soft to full. Both nipple appear healthy  Last feeding per mom= [phone] for 60 mins and                                          @0845  breast fed 5-10 mins and supplemented with 25 ml with the SNS.   Initial Feeding Assessment: Pre-feed Weight:7-14.7 oz 3594g  Post-feed Weight: 8.0 oz 3630 g  Amount Transferred:36 ml  Comments: Started off the feeding just latching and the due to infant inconsistent pattern added the SNS with 33 ml.   Additional Feeding Assessment:latched the 2nd breast for 5 mins. /no weight  Pre-feed Weight: Post-feed Weight: Amount Transferred: Comments:   Total Breast milk Transferred this Visit: 3 ml from the breast  Total Supplement Given: and with SNS tool 33 ml (formula )   Lactation Plan of care- 1) with SNS feed every 2-3 hours at the breast ( 45-60 ml ) 2) 1st breast feed with the SNS and if she is still hungry latch 2nd breast if not pump both post feeding. 3) Pump with DEBP at least 4-6 X's per day for 10 -15 mins 4) Feeding diary until Amber Nielsen comes up to birth weight. 4) Check into Mother's love Herbs for breast feeding due to hx of low milk supply ( Fenugreek, Goats root , blessed thistle and herb tea for breast feeding . Also [name] ( old fashion ) once a day.    Follow-Up- 1)  Per mom 9/5 with Amber Nielsen                       2) 9/10 F/u with woman's Nielsen lactation at 4pm                        3) Today check with Amber Nielsen if the 14 days of the diuretic is necessary considering your low milk supply history.            Amber Nielsen 11/11/2011, 9:40 AM

## 2011-11-17 ENCOUNTER — Ambulatory Visit (HOSPITAL_COMMUNITY)
Admission: RE | Admit: 2011-11-17 | Discharge: 2011-11-17 | Disposition: A | Discharge status: Home / Self Care | Payer: 59 | Source: Ambulatory Visit | Attending: Obstetrics | Admitting: Obstetrics

## 2011-11-17 DIAGNOSIS — O923 Agalactia: Secondary | ICD-10-CM | POA: Insufficient documentation

## 2011-11-17 NOTE — Progress Notes (Deleted)
Adult Lactation Consultation Outpatient Visit Note  Patient Name: Amber Nielsen   BABY: Peggye Pitt Date of Birth: [birth year]                 DOB: [birth year] Gestational Age at Delivery: 18 WEEKS   BIRTH WEIGHT: 8-9 Type of Delivery:                                         WEIGHT TODAY: 8-8.5(PER SMART START NURSE)  Breastfeeding History: Frequency of Breastfeeding: NONE SINCE 11/13/11 Length of Feeding:  Voids: 6+  Stools: 6+  Supplementing / Method:soy formula 60 mls every 2 hours as per MD recommendation Pumping:  Type of Pump:Hegea DEBP   Frequency:every 2-3 hours  Volume:  1-5 mls right/15-20 mls left  Comments:    Consultation Evaluation:Mom states she took baby to MD again on 11/13/11 for choking spell.  Chest x-ray was done to r/o aspiration and was negative.  Baby is now taking zantac tid and smaller more frequent feedings.  Baby has not had choking episode since visit.  Mom has decided to pump and bottlefeed due to time it takes is less and she has a two year old at home.  She is currently taking fenugreeek tid and asking if there is anything else she could do to increase supply.  Information sheet given on moringa supplementation and discussed power pumping when possible.  Praise given for all the efforts she is making to provide breastmilk to her baby.  Mom will call us prn.   Initial Feeding Assessment: Pre-feed Weight: Post-feed Weight: Amount Transferred: Comments:  Additional Feeding Assessment: Pre-feed Weight: Post-feed Weight: Amount Transferred: Comments:  Additional Feeding Assessment: Pre-feed Weight: Post-feed Weight: Amount Transferred: Comments:  Total Breast milk Transferred this Visit:  Total Supplement Given:   Additional Interventions:   Follow-Upwill call office prn      Lurline Hare [facility] 11/17/2011, 5:09 PM

## 2011-11-17 NOTE — Progress Notes (Signed)
Adult Lactation Consultation Outpatient Visit Note  Patient Name: Saxon Crosby Deyette  BABY: [name] DEYETTE Date of Birth: 1985/09/18                DOB: [birth year] Gestational Age at Delivery: 46 WEEKS   BIRTH WEIGHT: 8-9 Type of Delivery:                                         WEIGHT TODAY: 8-8.5(PER SMART START NURSE)  Breastfeeding History: Frequency of Breastfeeding: NONE SINCE 11/13/11 Length of Feeding:  Voids: 6+ Stools: 6+  Supplementing / Method:SOY FORMULA/EBM 2 OZ EVERY 2 HOURS PER MD RECOMMENDATIONS Pumping:  Type of Pump:HYGEA DEBP   Frequency:EVERY 2-3 HOURS  Volume:  1-5 MLS RIGHT/15-20 MLS LEFT  Comments:    Consultation Evaluation:Mom states baby has reflux and had another choking episode on 11/13/11. MD started baby on zantac tid and recommended smaller more frequent feedings which has improved condition.  Mom has decided to pump and bottlefeed due to low milk supply and time it takes is less especially with a two year old at home.  Mom asking if there is anything she can do to increase her supply.  She is currently taking fenugreek tid.  Information sheet given on moringa and also discussed power pumping at times.  Mom praised for her efforts to continue to provide breastmilk for her baby. Encouraged to call for concerns prn.  Initial Feeding Assessment: Pre-feed Weight: Post-feed Weight: Amount Transferred: Comments:  Additional Feeding Assessment: Pre-feed Weight: Post-feed Weight: Amount Transferred: Comments:  Additional Feeding Assessment: Pre-feed Weight: Post-feed Weight: Amount Transferred: Comments:  Total Breast milk Transferred this Visit:  Total Supplement Given:   Additional Interventions:   Follow-Up to call prn      Hansel Feinstein 11/17/2011, 5:34 PM

## 2012-09-29 ENCOUNTER — Encounter (HOSPITAL_COMMUNITY): Payer: Self-pay | Admitting: Family Medicine

## 2012-09-29 ENCOUNTER — Emergency Department (HOSPITAL_COMMUNITY)
Admission: EM | Admit: 2012-09-29 | Discharge: 2012-09-29 | Disposition: A | Discharge status: Home / Self Care | Payer: 59 | Attending: Emergency Medicine | Admitting: Emergency Medicine

## 2012-09-29 ENCOUNTER — Emergency Department (HOSPITAL_COMMUNITY): Payer: 59

## 2012-09-29 DIAGNOSIS — J029 Acute pharyngitis, unspecified: Secondary | ICD-10-CM | POA: Insufficient documentation

## 2012-09-29 DIAGNOSIS — F3289 Other specified depressive episodes: Secondary | ICD-10-CM | POA: Insufficient documentation

## 2012-09-29 DIAGNOSIS — Z8632 Personal history of gestational diabetes: Secondary | ICD-10-CM | POA: Insufficient documentation

## 2012-09-29 DIAGNOSIS — Y929 Unspecified place or not applicable: Secondary | ICD-10-CM | POA: Insufficient documentation

## 2012-09-29 DIAGNOSIS — Y9389 Activity, other specified: Secondary | ICD-10-CM | POA: Insufficient documentation

## 2012-09-29 DIAGNOSIS — R0609 Other forms of dyspnea: Secondary | ICD-10-CM | POA: Insufficient documentation

## 2012-09-29 DIAGNOSIS — Z79899 Other long term (current) drug therapy: Secondary | ICD-10-CM | POA: Insufficient documentation

## 2012-09-29 DIAGNOSIS — R0789 Other chest pain: Secondary | ICD-10-CM | POA: Insufficient documentation

## 2012-09-29 DIAGNOSIS — E669 Obesity, unspecified: Secondary | ICD-10-CM | POA: Insufficient documentation

## 2012-09-29 DIAGNOSIS — F329 Major depressive disorder, single episode, unspecified: Secondary | ICD-10-CM | POA: Insufficient documentation

## 2012-09-29 DIAGNOSIS — J45909 Unspecified asthma, uncomplicated: Secondary | ICD-10-CM | POA: Insufficient documentation

## 2012-09-29 DIAGNOSIS — F411 Generalized anxiety disorder: Secondary | ICD-10-CM | POA: Insufficient documentation

## 2012-09-29 DIAGNOSIS — R42 Dizziness and giddiness: Secondary | ICD-10-CM | POA: Insufficient documentation

## 2012-09-29 DIAGNOSIS — R0989 Other specified symptoms and signs involving the circulatory and respiratory systems: Secondary | ICD-10-CM | POA: Insufficient documentation

## 2012-09-29 DIAGNOSIS — Y990 Civilian activity done for income or pay: Secondary | ICD-10-CM | POA: Insufficient documentation

## 2012-09-29 DIAGNOSIS — Z8619 Personal history of other infectious and parasitic diseases: Secondary | ICD-10-CM | POA: Insufficient documentation

## 2012-09-29 DIAGNOSIS — T5991XA Toxic effect of unspecified gases, fumes and vapors, accidental (unintentional), initial encounter: Secondary | ICD-10-CM | POA: Insufficient documentation

## 2012-09-29 MED ORDER — ALBUTEROL SULFATE HFA 108 (90 BASE) MCG/ACT IN AERS
2.0000 | INHALATION_SPRAY | RESPIRATORY_TRACT | Status: DC
  Administered 2012-09-29: 2 via RESPIRATORY_TRACT
  Filled 2012-09-29: qty 6.7

## 2012-09-29 NOTE — ED Notes (Addendum)
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 Pt states she has a hx of asthma attack but is not on any inhalers. Pt states she felt like she did when she had an asthma attack. Pt states last one was 2-3 yrs ago. Pt called pcp they told her to come here.

## 2012-09-29 NOTE — ED Notes (Signed)
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 sts she began to feel headache, sore throat and pain when she breathes. sts the smell was really strong.

## 2012-09-29 NOTE — ED Notes (Addendum)
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  She felt ill, headache and her chest felt really tight like her throat was going to close.  Pt called md and was told to come to ED.  Once outside of the room, pt began to feel better.  States he chest hurts when takes a deep breath.  Lung sounds clear bilaterially. No distress noted at this time. Pt alert oriented X4

## 2012-09-29 NOTE — ED Provider Notes (Signed)
Medical screening examination/treatment/procedure(s) were performed by non-physician practitioner and as supervising physician I was immediately available for consultation/collaboration.  Lyanne Co, MD 09/29/12 [phone]

## 2012-09-29 NOTE — ED Provider Notes (Signed)
History    CSN: [id number] Arrival date & time 09/29/12  1127  First MD Initiated Contact with Patient 09/29/12 1154     Chief Complaint  Patient presents with  . Headache  . Sore Throat   (Consider location/radiation/quality/duration/timing/severity/associated sxs/prior Treatment) HPI  27 year old obese female with history of recurrent headaches presents complaining of headache sore throat and chest discomfort. 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 Pt smell strong fume.  After being in the room for 1-2 hrs patient developed gradual onset of headache, sore throat, difficulty breathing, and pleuritic chest pain. Patient felt lightheadedness without passing out. She left the room and her symptoms improved. However she continues to endorse mild chest tightness and pleuritic chest pain. Patient denies fever, vision changes, runny nose, sneezing, nausea vomiting or diarrhea, or rash. Has history of allergy related asthma. Denies prior history of PE or DVT. Denies productive cough or hemoptysis. No symptoms prior to exposure of paint fumes.    Past Medical History  Diagnosis Date  . Asthma     allergy related. rodent animals  . Obesity   . Depression   . Anxiety   . Headache(784.0)   . Gestational diabetes     diet controlled  . Pregnancy induced hypertension   . Proteinuria complicating pregnancy   . H/O varicella    Past Surgical History  Procedure Laterality Date  . Tooth extraction     Family History  Problem Relation Age of Onset  . Heart disease Father   . Heart attack Father   . Nephrolithiasis Father   . Mental retardation Maternal Aunt   . Diabetes Maternal Grandmother   . Cancer Maternal Grandmother     ovarian and breast  . Diabetes Maternal Grandfather   . Diabetes Paternal Grandmother   . COPD Paternal Grandmother   . Hypertension Paternal Grandmother   . Diabetes Paternal Grandfather     History  Substance Use Topics  . Smoking status: Never Smoker   . Smokeless tobacco: Not on file  . Alcohol Use: No   OB History   Grav Para Term Preterm Abortions TAB SAB Ect Mult Living   4 2 2  1  1   2      Review of Systems  Constitutional: Negative for fever.  Respiratory: Positive for chest tightness. Negative for wheezing.   Cardiovascular: Positive for chest pain.  Neurological: Positive for headaches.  All other systems reviewed and are negative.    Allergies  Celery oil  Home Medications   Current Outpatient Rx  Name  Route  Sig  Dispense  Refill  . acetaminophen (TYLENOL) 325 MG tablet   Oral   Take 650 mg by mouth every 6 (six) hours as needed. For pain or headache         . busPIRone (BUSPAR) 5 MG tablet   Oral   Take 5 mg by mouth every 6 (six) hours as needed. For anxiety, can also take 2 tablets at bedtime if needed         . famotidine (PEPCID AC MAXIMUM STRENGTH) 20 MG tablet   Oral   Take 20 mg by mouth every morning.         . Prenatal Vit-Fe Fumarate-FA (PRENATAL MULTIVITAMIN) TABS   Oral   Take 1 tablet by mouth daily.         . sertraline (ZOLOFT) 50 MG tablet   Oral  Take 50 mg by mouth daily.          BP 135/71  Temp(Src) 98.3 F (36.8 C) (Oral)  Resp 18  SpO2 97%  LMP 09/28/2012 Physical Exam  Nursing note and vitals reviewed. Constitutional: She is oriented to person, place, and time. She appears well-developed and well-nourished. No distress.  HENT:  Head: Atraumatic.  Mouth/Throat: Oropharynx is clear and moist.  Eyes: Conjunctivae are normal.  Neck: Neck supple.  Cardiovascular: Normal rate and regular rhythm.   Pulmonary/Chest: Effort normal and breath sounds normal. No respiratory distress. She has no wheezes. She has no rales. She exhibits no tenderness.  Musculoskeletal: She exhibits no edema.  Neurological: She is alert and oriented to person, place, and time.  Skin: Skin is warm. No rash noted.   Psychiatric: She has a normal mood and affect.    ED Course  Procedures (including critical care time)  12:08 PM Pt with difficulty breathing after exposing to paint fume at work.  Her sxs improves without treatment.  No resp distress, lung exam unremarkable.  Doubt PE, PNA, cardiac etiology, carbon monoxide poisoning, asthma.  Likely reactive airway disease.  Currently sats at 98% on RA.  Will obtain CXR to r/u chemical pneumonitis, give rescue inhaler for sxs treatment.  Headache is minimal at this time.    1:26 PM Pt felt much better after using rescue inhaler.  Is currently back to baseline.  CXR unremarkable, VSS.  Pt stable for discharge.  Recommend avoid enclosed space with paint fume.    Labs Reviewed - No data to display Dg Chest 2 View  09/29/2012   *RADIOLOGY REPORT*  Clinical Data: Shortness of breath and chest pain  CHEST - 2 VIEW  Comparison: 04/10/2005  Findings: The heart and pulmonary vascularity are within normal limits.  The lungs are clear bilaterally.  No bony abnormality is seen.  IMPRESSION: No acute abnormality noted.   Original Report Authenticated By: Alcide Clever, M.D.   1. Inhalation of noxious fumes, initial encounter     MDM  BP 110/62  Pulse 74  Temp(Src) 98.3 F (36.8 C) (Oral)  Resp 18  SpO2 99%  LMP 09/28/2012  I have reviewed nursing notes and vital signs. I personally reviewed the imaging tests through PACS system  I reviewed available ER/hospitalization records thought the EMR   Fayrene Helper, [place] 09/29/12 1329

## 2012-12-15 ENCOUNTER — Other Ambulatory Visit (INDEPENDENT_AMBULATORY_CARE_PROVIDER_SITE_OTHER): Payer: Self-pay

## 2012-12-15 ENCOUNTER — Encounter (INDEPENDENT_AMBULATORY_CARE_PROVIDER_SITE_OTHER): Payer: Self-pay | Admitting: Surgery

## 2012-12-15 ENCOUNTER — Ambulatory Visit (INDEPENDENT_AMBULATORY_CARE_PROVIDER_SITE_OTHER): Payer: 59 | Admitting: Surgery

## 2012-12-15 DIAGNOSIS — Z6841 Body Mass Index (BMI) 40.0 and over, adult: Secondary | ICD-10-CM

## 2012-12-15 DIAGNOSIS — J45909 Unspecified asthma, uncomplicated: Secondary | ICD-10-CM

## 2012-12-15 DIAGNOSIS — F329 Major depressive disorder, single episode, unspecified: Secondary | ICD-10-CM

## 2012-12-15 DIAGNOSIS — F3289 Other specified depressive episodes: Secondary | ICD-10-CM

## 2012-12-15 LAB — CBC WITH DIFFERENTIAL/PLATELET
Basophils Absolute: 0 10*3/uL (ref 0.0–0.1)
Basophils Relative: 0 % (ref 0–1)
Eosinophils Absolute: 0.1 10*3/uL (ref 0.0–0.7)
Eosinophils Relative: 1 % (ref 0–5)
HCT: 39.4 % (ref 36.0–46.0)
Hemoglobin: 13.2 g/dL (ref 12.0–15.0)
Lymphocytes Relative: 24 % (ref 12–46)
Lymphs Abs: 2.6 10*3/uL (ref 0.7–4.0)
MCH: 28.7 pg (ref 26.0–34.0)
MCHC: 33.5 g/dL (ref 30.0–36.0)
MCV: 85.7 fL (ref 78.0–100.0)
Monocytes Absolute: 0.8 10*3/uL (ref 0.1–1.0)
Monocytes Relative: 7 % (ref 3–12)
Neutro Abs: 7.4 10*3/uL (ref 1.7–7.7)
Neutrophils Relative %: 68 % (ref 43–77)
Platelets: 450 10*3/uL — ABNORMAL HIGH (ref 150–400)
RBC: 4.6 MIL/uL (ref 3.87–5.11)
RDW: 14.3 % (ref 11.5–15.5)
WBC: 10.9 10*3/uL — ABNORMAL HIGH (ref 4.0–10.5)

## 2012-12-15 NOTE — Progress Notes (Signed)
Chief Complaint:  Gestational diabetes and morbid obesity  History of Present Illness:  Amber Nielsen is an 27 y.o. female who has been to our seminar and is interested in pursuing laparoscopic Roux-en-Y gastric bypass. She doesn't feel that a bandage right for her. She comes in with a BMI of 42. She is research these pretty well and feels comfortable and her decision. She has had lifelong obesity and her maternal side of the family are quite round and obese. Her husband says she does not snore and it does not sound that she has any problems with obstructive sleep apnea. She has 2 daughters both horn by vaginal delivery and denies any abdominal surgery.  Past Medical History  Diagnosis Date  . Asthma     allergy related. rodent animals  . Obesity   . Depression   . Anxiety   . Headache(784.0)   . Gestational diabetes     diet controlled  . Pregnancy induced hypertension   . Proteinuria complicating pregnancy   . H/O varicella     Past Surgical History  Procedure Laterality Date  . Tooth extraction      Current Outpatient Prescriptions  Medication Sig Dispense Refill  . clonazePAM (KLONOPIN) 0.5 MG tablet Take 0.5 mg by mouth daily.      Marland Kitchen lurasidone (LATUDA) 80 MG TABS Take 80 mg by mouth daily with breakfast.      . metFORMIN (GLUCOPHAGE) 500 MG tablet Take 500 mg by mouth 3 (three) times daily before meals.      . Multiple Vitamin (MULTIVITAMIN WITH MINERALS) TABS Take 1 tablet by mouth daily.      . sertraline (ZOLOFT) 50 MG tablet Take 50 mg by mouth daily.      . Vitamin D, Ergocalciferol, (DRISDOL) 50000 UNITS CAPS Take 50,000 Units by mouth every 7 (seven) days. On Monday       No current facility-administered medications for this visit.   Celery oil Family History  Problem Relation Age of Onset  . Heart disease Father   . Heart attack Father   . Nephrolithiasis Father   . Mental retardation Maternal Aunt   . Diabetes Maternal Grandmother   . Cancer Maternal  Grandmother     ovarian and breast  . Diabetes Maternal Grandfather   . Diabetes Paternal Grandmother   . COPD Paternal Grandmother   . Hypertension Paternal Grandmother   . Diabetes Paternal Grandfather    Social History:   reports that she has never smoked. She has never used smokeless tobacco. She reports that she does not drink alcohol or use illicit drugs.   REVIEW OF SYSTEMS - PERTINENT POSITIVES ONLY: Review of systems is negative except for leg swelling, diarrhea, headaches. She does deny DVT. The only surgery she knowledge is his removal of wisdom teeth.  Physical Exam:   Blood pressure 130/88, pulse 88, temperature 99.4 F (37.4 C), temperature source Temporal, resp. rate 16, height 5' 9.5" (1.765 m), weight 293 lb (132.904 kg). Body mass index is 42.66 kg/(m^2).  Gen:  WDWN white female NAD  Neurological: Alert and oriented to person, place, and time. Motor and sensory function is grossly intact  Head: Normocephalic and atraumatic.  Eyes: Conjunctivae are normal. Pupils are equal, round, and reactive to light. No scleral icterus.  Neck: Normal range of motion. Neck supple. No tracheal deviation or thyromegaly present.  Cardiovascular:  SR without murmurs or gallops.  No carotid bruits Respiratory: Effort normal.  No respiratory distress. No chest wall  tenderness. Breath sounds normal.  No wheezes, rales or rhonchi.  Abdomen:  Obese nontender GU: Musculoskeletal: Normal range of motion. Extremities are nontender. No cyanosis, edema or clubbing noted Lymphadenopathy: No cervical, preauricular, postauricular or axillary adenopathy is present Skin: Skin is warm and dry. No rash noted. No diaphoresis. No erythema. No pallor. Pscyh: Normal mood and affect. Behavior is normal. Judgment and thought content normal.   LABORATORY RESULTS: No results found for this or any previous visit (from the past 48 hour(s)).  RADIOLOGY RESULTS: No results found.  Problem List: Patient  Active Problem List   Diagnosis Date Noted  . Postpartum care following vaginal delivery (8/29) 11/06/2011  . Gestational diabetes mellitus, class A2 11/05/2011  . Mild preeclampsia 11/05/2011    Assessment & Plan: Morbid obesity with BMI 42.6 We'll begin a work up toward laparoscopic Roux-en-Y gastric bypass.    Matt B. Daphine Deutscher, MD, [facility] [phone] beeper [phone]  12/15/2012 11:34 AM

## 2012-12-15 NOTE — Patient Instructions (Signed)

## 2012-12-16 LAB — HCG, SERUM, QUALITATIVE: Preg, Serum: NEGATIVE

## 2012-12-16 LAB — H. PYLORI ANTIBODY, IGG: H Pylori IgG: 0.58 {ISR}

## 2012-12-16 LAB — T4: T4, Total: 9.1 ug/dL (ref 5.0–12.5)

## 2012-12-16 LAB — TSH: TSH: 1.052 u[IU]/mL (ref 0.350–4.500)

## 2012-12-19 ENCOUNTER — Other Ambulatory Visit (INDEPENDENT_AMBULATORY_CARE_PROVIDER_SITE_OTHER): Payer: Self-pay

## 2012-12-27 ENCOUNTER — Ambulatory Visit (HOSPITAL_COMMUNITY): Payer: 59 | Attending: Surgery

## 2012-12-27 ENCOUNTER — Other Ambulatory Visit (HOSPITAL_COMMUNITY): Payer: 59

## 2012-12-27 ENCOUNTER — Inpatient Hospital Stay (HOSPITAL_COMMUNITY): Admission: RE | Admit: 2012-12-27 | Payer: 59 | Source: Ambulatory Visit

## 2012-12-29 ENCOUNTER — Other Ambulatory Visit (HOSPITAL_COMMUNITY): Payer: 59

## 2012-12-30 ENCOUNTER — Ambulatory Visit (HOSPITAL_COMMUNITY): Payer: 59

## 2013-01-03 ENCOUNTER — Ambulatory Visit (HOSPITAL_COMMUNITY): Admission: RE | Admit: 2013-01-03 | Payer: 59 | Source: Ambulatory Visit | Admitting: Surgery

## 2013-01-03 ENCOUNTER — Encounter: Payer: Self-pay | Admitting: *Deleted

## 2013-01-03 ENCOUNTER — Ambulatory Visit: Payer: 59 | Admitting: *Deleted

## 2013-01-03 SURGERY — BREATH TEST, FOR HELICOBACTER PYLORI

## 2013-03-08 ENCOUNTER — Emergency Department (HOSPITAL_COMMUNITY): Payer: 59

## 2013-03-08 ENCOUNTER — Emergency Department (HOSPITAL_COMMUNITY)
Admission: EM | Admit: 2013-03-08 | Discharge: 2013-03-08 | Disposition: A | Discharge status: Home / Self Care | Payer: 59 | Attending: Emergency Medicine | Admitting: Emergency Medicine

## 2013-03-08 ENCOUNTER — Encounter (HOSPITAL_COMMUNITY): Payer: Self-pay | Admitting: Emergency Medicine

## 2013-03-08 DIAGNOSIS — J329 Chronic sinusitis, unspecified: Secondary | ICD-10-CM

## 2013-03-08 DIAGNOSIS — Z8632 Personal history of gestational diabetes: Secondary | ICD-10-CM | POA: Insufficient documentation

## 2013-03-08 DIAGNOSIS — E669 Obesity, unspecified: Secondary | ICD-10-CM | POA: Insufficient documentation

## 2013-03-08 DIAGNOSIS — F411 Generalized anxiety disorder: Secondary | ICD-10-CM | POA: Insufficient documentation

## 2013-03-08 DIAGNOSIS — F329 Major depressive disorder, single episode, unspecified: Secondary | ICD-10-CM | POA: Insufficient documentation

## 2013-03-08 DIAGNOSIS — J111 Influenza due to unidentified influenza virus with other respiratory manifestations: Secondary | ICD-10-CM | POA: Insufficient documentation

## 2013-03-08 DIAGNOSIS — J45909 Unspecified asthma, uncomplicated: Secondary | ICD-10-CM | POA: Insufficient documentation

## 2013-03-08 DIAGNOSIS — J209 Acute bronchitis, unspecified: Secondary | ICD-10-CM | POA: Insufficient documentation

## 2013-03-08 DIAGNOSIS — F3289 Other specified depressive episodes: Secondary | ICD-10-CM | POA: Insufficient documentation

## 2013-03-08 DIAGNOSIS — Z8619 Personal history of other infectious and parasitic diseases: Secondary | ICD-10-CM | POA: Insufficient documentation

## 2013-03-08 DIAGNOSIS — J40 Bronchitis, not specified as acute or chronic: Secondary | ICD-10-CM

## 2013-03-08 DIAGNOSIS — Z79899 Other long term (current) drug therapy: Secondary | ICD-10-CM | POA: Insufficient documentation

## 2013-03-08 MED ORDER — AZITHROMYCIN 250 MG PO TABS: ORAL_TABLET | ORAL | Status: DC

## 2013-03-08 MED ORDER — HYDROCODONE-HOMATROPINE 5-1.5 MG/5ML PO SYRP: 5.0000 mL | ORAL_SOLUTION | Freq: Four times a day (QID) | ORAL | Status: DC | PRN

## 2013-03-08 NOTE — ED Provider Notes (Signed)
CSN: [id number]     Arrival date & time 03/08/13  1723 History   First MD Initiated Contact with Patient 03/08/13 1747     Chief Complaint  Patient presents with  . URI    HPI PT with non-productive cough x 3 days. Today she began to feel burning in her chest and back when she breathes. Also c/o rhinitis and states inhaler is "not working". VS stable.  Patient has some sinus drainage along with this.  Past Medical History  Diagnosis Date  . Asthma     allergy related. rodent animals  . Obesity   . Depression   . Anxiety   . Headache(784.0)   . Gestational diabetes     diet controlled  . Pregnancy induced hypertension   . H/O varicella   . Proteinuria complicating pregnancy    Past Surgical History  Procedure Laterality Date  . Tooth extraction     Family History  Problem Relation Age of Onset  . Heart disease Father   . Heart attack Father   . Nephrolithiasis Father   . Mental retardation Maternal Aunt   . Diabetes Maternal Grandmother   . Cancer Maternal Grandmother     ovarian and breast  . Diabetes Maternal Grandfather   . Diabetes Paternal Grandmother   . COPD Paternal Grandmother   . Hypertension Paternal Grandmother   . Diabetes Paternal Grandfather    History  Substance Use Topics  . Smoking status: Never Smoker   . Smokeless tobacco: Never Used  . Alcohol Use: No   OB History   Grav Para Term Preterm Abortions TAB SAB Ect Mult Living   4 2 2  1  1   2      Review of Systems All other systems reviewed and are negative Allergies  Celery oil  Home Medications   Current Outpatient Rx  Name  Route  Sig  Dispense  Refill  . albuterol (PROVENTIL HFA;VENTOLIN HFA) 108 (90 BASE) MCG/ACT inhaler   Inhalation   Inhale 1 puff into the lungs every 6 (six) hours as needed for wheezing or shortness of breath.         . Doxylamine Succinate, Sleep, (SLEEP AID PO)   Oral   Take 2 tablets by mouth at bedtime as needed (sleep).         . Multiple  Vitamin (MULTIVITAMIN WITH MINERALS) TABS   Oral   Take 1 tablet by mouth daily.         . norethindrone-ethinyl estradiol (JUNEL FE,GILDESS FE,LOESTRIN FE) 1-20 MG-MCG tablet   Oral   Take 1 tablet by mouth daily.         . sertraline (ZOLOFT) 50 MG tablet   Oral   Take 50 mg by mouth at bedtime.          . Vitamin D, Ergocalciferol, (DRISDOL) 50000 UNITS CAPS   Oral   Take 50,000 Units by mouth every 7 (seven) days. On Monday         . azithromycin (ZITHROMAX Z-PAK) 250 MG tablet      Take 2 tablets on day one and one tablet a day until gone   6 tablet   0   . HYDROcodone-homatropine (HYCODAN) 5-1.5 MG/5ML syrup   Oral   Take 5 mLs by mouth every 6 (six) hours as needed for cough.   120 mL   0    BP 119/76  Pulse 100  Temp(Src) 96.9 F (36.1 C) (Oral)  Resp 18  SpO2 98%  LMP 03/01/2013 Physical Exam  Nursing note and vitals reviewed. Constitutional: She is oriented to person, place, and time. She appears well-developed and well-nourished. No distress.  HENT:  Head: Normocephalic and atraumatic.  Eyes: Pupils are equal, round, and reactive to light.  Neck: Normal range of motion.  Cardiovascular: Normal rate and intact distal pulses.   Pulmonary/Chest: No respiratory distress. She has no wheezes.  Abdominal: Normal appearance. She exhibits no distension.  Musculoskeletal: Normal range of motion.  Neurological: She is alert and oriented to person, place, and time. No cranial nerve deficit.  Skin: Skin is warm and dry. No rash noted.  Psychiatric: She has a normal mood and affect. Her behavior is normal.    ED Course  Procedures (including critical care time) Labs Review Labs Reviewed - No data to display Imaging Review Dg Chest 2 View (if Patient Has Fever And/or Copd)  03/08/2013   CLINICAL DATA:  Chest pain, cough  EXAM: CHEST  2 VIEW  COMPARISON:  DG CHEST 2 VIEW dated 09/29/2012  FINDINGS: Normal mediastinum and cardiac silhouette. Normal  pulmonary vasculature. No evidence of effusion, infiltrate, or pneumothorax. There is mild coarsening of the bronchovascular markings centrally. No acute bony abnormality.  IMPRESSION: Findings suggest mild bronchitis or bronchiolitis. No focal consolidation.   Electronically Signed   By: Genevive Bi M.D.   On: 03/08/2013 18:15    EKG Interpretation   None       MDM   1. Influenza   2. Bronchitis   3. Sinusitis        Nelia Shi, MD 03/08/13 1900

## 2013-03-08 NOTE — ED Notes (Signed)
PT with non-productive cough x 3 days.  Today she began to feel burning in her chest and back when she breathes.  Also c/o rhinitis and states inhaler is "not working".  VS stable.

## 2013-04-17 ENCOUNTER — Ambulatory Visit: Payer: 59 | Admitting: Cardiology

## 2013-07-03 ENCOUNTER — Encounter: Payer: Self-pay | Admitting: Cardiology

## 2013-07-03 ENCOUNTER — Ambulatory Visit (INDEPENDENT_AMBULATORY_CARE_PROVIDER_SITE_OTHER): Payer: 59 | Admitting: Cardiology

## 2013-07-03 VITALS — BP 116/74 | HR 78 | Ht 69.5 in | Wt 284.0 lb

## 2013-07-03 DIAGNOSIS — R002 Palpitations: Secondary | ICD-10-CM

## 2013-07-03 DIAGNOSIS — R42 Dizziness and giddiness: Secondary | ICD-10-CM

## 2013-07-03 DIAGNOSIS — R079 Chest pain, unspecified: Secondary | ICD-10-CM

## 2013-07-03 NOTE — Patient Instructions (Signed)
Your physician has recommended that you wear a 48 HOUR  holter monitor. Holter monitors are medical devices that record the heart's electrical activity. Doctors most often use these monitors to diagnose arrhythmias. Arrhythmias are problems with the speed or rhythm of the heartbeat. The monitor is a small, portable device. You can wear one while you do your normal daily activities. This is usually used to diagnose what is causing palpitations/syncope (passing out).  Your physician has requested that you have an exercise tolerance test. For further information please visit [email or web address] Please also follow instruction sheet, as given.  Your physician recommends that you return for lab work in: TODAY (LPA-LIPOPROTEIN & NMR-LIPO PROFILE WITH LIPIDS)  Your physician recommends that you continue on your current medications as directed. Please refer to the Current Medication list given to you today.  Your physician recommends that you follow-up WITH DR Delton SeeNELSON PENDING RESULTS

## 2013-07-03 NOTE — Progress Notes (Signed)
Patient ID: Amber Nielsen, female   DOB: 09/12/1985, 28 y.o.   MRN: [id number]    Patient Name: Amber Nielsen Date of Encounter: 07/03/2013  Primary Care Provider:  Default, Provider, MD Primary Cardiologist: Lars MassonKatarina H Johonna Binette  Problem List   Past Medical History  Diagnosis Date  . Asthma     allergy related. rodent animals  . Obesity   . Depression   . Anxiety   . Headache(784.0)   . Gestational diabetes     diet controlled  . Pregnancy induced hypertension   . H/O varicella   . Proteinuria complicating pregnancy    Past Surgical History  Procedure Laterality Date  . Tooth extraction      Allergies  Allergies  Allergen Reactions  . Celery Oil Swelling    Tongue swelling when eating celery    HPI  A 28 year old female with no significant PMH who is coming complaining of episodes of chest pain radiating to her back and shoulder and sometimes back pain radiating to the shoulder and chest. She states that with any activity she becomes overly SOB, with palpitations, weakness, dizziness and diaphoresis. She was never able to measure her BP during these episodes. No true syncope, but many presyncopal episodes.  Sometimes these episodes can wake her up at night.  She has significant FH of CAD, her father had a heart attack at age 26. Her brother has elevated cholesterol. She states that her cholesterol was checked once and it was normal.   Home Medications  Prior to Admission medications   Medication Sig Start Date End Date Taking? Authorizing Provider  albuterol (PROVENTIL HFA;VENTOLIN HFA) 108 (90 BASE) MCG/ACT inhaler Inhale 1 puff into the lungs every 6 (six) hours as needed for wheezing or shortness of breath.   Yes Historical Provider, MD  sertraline (ZOLOFT) 50 MG tablet Take 50 mg by mouth at bedtime.    Yes Historical Provider, MD  Vitamin D, Ergocalciferol, (DRISDOL) 50000 UNITS CAPS Take 50,000 Units by mouth every 7 (seven) days. On Monday   Yes Historical  Provider, MD    Family History  Family History  Problem Relation Age of Onset  . Heart disease Father   . Heart attack Father   . Nephrolithiasis Father   . Mental retardation Maternal Aunt   . Diabetes Maternal Grandmother   . Cancer Maternal Grandmother     ovarian and breast  . Diabetes Maternal Grandfather   . Diabetes Paternal Grandmother   . COPD Paternal Grandmother   . Hypertension Paternal Grandmother   . Diabetes Paternal Grandfather     Social History  History   Social History  . Marital Status: Married    Spouse Name: N/A    Number of Children: N/A  . Years of Education: N/A   Occupational History  . Not on file.   Social History Main Topics  . Smoking status: Never Smoker   . Smokeless tobacco: Never Used  . Alcohol Use: No  . Drug Use: No  . Sexual Activity: Yes   Other Topics Concern  . Not on file   Social History Narrative  . No narrative on file     Review of Systems, as per HPI, otherwise negative General:  No chills, fever, night sweats or weight changes.  Cardiovascular:  No chest pain, dyspnea on exertion, edema, orthopnea, palpitations, paroxysmal nocturnal dyspnea. Dermatological: No rash, lesions/masses Respiratory: No cough, dyspnea Urologic: No hematuria, dysuria Abdominal:   No nausea, vomiting, diarrhea,  bright red blood per rectum, melena, or hematemesis Neurologic:  No visual changes, wkns, changes in mental status. All other systems reviewed and are otherwise negative except as noted above.  Physical Exam  Blood pressure 116/74, pulse 78, height 5' 9.5" (1.765 m), weight 284 lb (128.822 kg).  General: Pleasant, NAD, obese Psych: Normal affect. Neuro: Alert and oriented X 3. Moves all extremities spontaneously. HEENT: Normal  Neck: Supple without bruits or JVD. Lungs:  Resp regular and unlabored, CTA. Heart: RRR no s3, s4, or murmurs. Abdomen: Soft, non-tender, non-distended, BS + x 4.  Extremities: No clubbing,  cyanosis or edema. DP/PT/Radials 2+ and equal bilaterally.  Labs:  No results found for this basename: CKTOTAL, CKMB, TROPONINI,  in the last 72 hours Lab Results  Component Value Date   WBC 10.9* 12/15/2012   HGB 13.2 12/15/2012   HCT 39.4 12/15/2012   MCV 85.7 12/15/2012   PLT 450* 12/15/2012    No results found for this basename: DDIMER   No components found with this basename: POCBNP,     Component Value Date/Time   NA 138 11/07/2011 0500   K 4.0 11/07/2011 0500   CL 103 11/07/2011 0500   CO2 24 11/07/2011 0500   GLUCOSE 88 11/07/2011 0500   BUN 9 11/07/2011 0500   CREATININE 0.47* 11/07/2011 0500   CALCIUM 9.0 11/07/2011 0500   PROT 5.9* 11/07/2011 0500   ALBUMIN 2.0* 11/07/2011 0500   AST 24 11/07/2011 0500   ALT 23 11/07/2011 0500   ALKPHOS 115 11/07/2011 0500   BILITOT 0.1* 11/07/2011 0500   GFRNONAA >90 11/07/2011 0500   GFRAA >90 11/07/2011 0500   Accessory Clinical Findings  Echocardiogram - none  ECG - SR, normal ECG    Assessment & Plan  28 years old female   1. Chest pain, dizziness, diaphoreses mostly exertional - the chance of 28 year old having CAD is low, however her father had MI at age 26. She is also morbidly obese and inactive. An anomalous coronary is on the differential. We will check an exercise nuclear stress test to rule our ischemia and to try to imitate the symptoms. We will also order a 48 hour Holter monitor to rule any significant arrhythmias.   2. BP - controlled today, however the patient states that it can be elavetd during these episodes  3. Lipids - with significant FH of premature CAD we will check NMR lipid profile and LPa and follow.  Follow up after the tests.     Lars MassonKatarina H Elmin Wiederholt, MD, [facility] 07/03/2013, 4:05 PM

## 2013-07-04 LAB — NMR LIPOPROFILE WITH LIPIDS
Cholesterol, Total: 171 mg/dL (ref <200)
HDL Particle Number: 29.6 umol/L — ABNORMAL LOW (ref >=30.5)
HDL Size: 9.2 nm (ref >=9.2)
HDL-C: 45 mg/dL (ref >=40)
LDL (calc): 94 mg/dL (ref <100)
LDL Particle Number: 1418 nmol/L — ABNORMAL HIGH (ref <1000)
LDL Size: 20.3 nm — ABNORMAL LOW (ref >20.5)
LP-IR Score: 54 — ABNORMAL HIGH (ref <=45)
Large HDL-P: 7.4 umol/L (ref >=4.8)
Large VLDL-P: 6.8 nmol/L — ABNORMAL HIGH (ref <=2.7)
Small LDL Particle Number: 803 nmol/L — ABNORMAL HIGH (ref <=527)
Triglycerides: 162 mg/dL — ABNORMAL HIGH (ref <150)
VLDL Size: 50.9 nm — ABNORMAL HIGH (ref <=46.6)

## 2013-07-04 LAB — LIPOPROTEIN A (LPA): Lipoprotein (a): 8 mg/dL (ref 0–30)

## 2013-07-05 ENCOUNTER — Ambulatory Visit: Payer: 59 | Admitting: Cardiology

## 2013-07-05 ENCOUNTER — Encounter: Payer: Self-pay | Admitting: *Deleted

## 2013-07-05 ENCOUNTER — Encounter (INDEPENDENT_AMBULATORY_CARE_PROVIDER_SITE_OTHER): Payer: 59

## 2013-07-05 DIAGNOSIS — R079 Chest pain, unspecified: Secondary | ICD-10-CM

## 2013-07-05 DIAGNOSIS — R002 Palpitations: Secondary | ICD-10-CM

## 2013-07-05 DIAGNOSIS — R42 Dizziness and giddiness: Secondary | ICD-10-CM

## 2013-07-05 NOTE — Progress Notes (Signed)
Patient ID: Berta MinorKellie S Deyette, female   DOB: 11/02/1985, 28 y.o.   MRN: [id number] E-Cardio 48 hour holter monitor applied to patient.

## 2013-07-19 ENCOUNTER — Telehealth: Payer: Self-pay | Admitting: *Deleted

## 2013-07-19 NOTE — Telephone Encounter (Signed)
Contacted pt to notify her that her holter monitor results came back and Dr Delton SeeNelson reviewed them.  Per Dr Delton SeeNelson pts Holter Monitor results were normal. Pt verbalized understanding and pleased with this news.

## 2013-07-24 ENCOUNTER — Other Ambulatory Visit: Payer: Self-pay | Admitting: *Deleted

## 2013-07-24 ENCOUNTER — Telehealth: Payer: Self-pay | Admitting: *Deleted

## 2013-07-24 ENCOUNTER — Telehealth (HOSPITAL_COMMUNITY): Payer: Self-pay | Admitting: *Deleted

## 2013-07-24 DIAGNOSIS — I998 Other disorder of circulatory system: Secondary | ICD-10-CM

## 2013-07-24 NOTE — Telephone Encounter (Signed)
Message copied by Loa SocksMARTIN, IVY M on Mon Jul 24, 2013 11:30 AM ------      Message from: Cleon GustinBROWN-HOLLIMAN, EBONY D      Created: Mon Jul 24, 2013 10:18 AM      Regarding: Stress Test Order       Good Morning      I am looking over the schedule for stress tests and I see that this patient is scheduled for an exc tolerance on 05/26, however the dictation states that she is to have a exercise nuclear stress test.            Can you please let me know which one the patient is to have because it does make a difference in the time.            Thanks      Ebony ------

## 2013-07-24 NOTE — Telephone Encounter (Signed)
Placed order for exercise nuclear stress test in epic.

## 2013-08-01 ENCOUNTER — Encounter (HOSPITAL_COMMUNITY): Payer: 59

## 2013-08-09 ENCOUNTER — Telehealth (HOSPITAL_COMMUNITY): Payer: Self-pay

## 2013-08-10 ENCOUNTER — Telehealth (HOSPITAL_COMMUNITY): Payer: Self-pay

## 2013-08-10 DIAGNOSIS — R079 Chest pain, unspecified: Secondary | ICD-10-CM

## 2013-08-10 DIAGNOSIS — R42 Dizziness and giddiness: Secondary | ICD-10-CM

## 2013-08-10 DIAGNOSIS — R002 Palpitations: Secondary | ICD-10-CM

## 2013-08-11 NOTE — Telephone Encounter (Signed)
Message copied by Loa Socks on Fri Aug 11, 2013  9:18 AM ------      Message from: Britt Bolognese      Created: Fri Aug 11, 2013  8:59 AM      Regarding: Cancel Myoview for 08-15-13       Ebony,            Per Dr. Delton See, please cancel myoview due to insurance doesn't want to approve.             Please schedule pt for GXT and 2D echo instead.            Tennyson Kallen,            Can you please cancel myoview order and put in order for GXT and 2D echo            Thanks            Charmaine       ------

## 2013-08-11 NOTE — Telephone Encounter (Signed)
Per Dr Delton See we should order this pt a gxt and a echo, and cancel the myoview due to insurance not approving.  Will inform billing and scheduling to further follow-up with this.

## 2013-08-11 NOTE — Telephone Encounter (Signed)
Spoke w/pt today notifying her that her nuclear stress test was canceled as [facility] is not wanting to authorize pt's nuclear stress test.  Per Dr. Delton See pt is to have a 2D echo and GXT.  Pt aware but will c/b to schedule this.

## 2013-08-14 ENCOUNTER — Telehealth (HOSPITAL_COMMUNITY): Payer: Self-pay | Admitting: *Deleted

## 2013-08-15 ENCOUNTER — Encounter (HOSPITAL_COMMUNITY): Payer: 59

## 2013-08-16 ENCOUNTER — Ambulatory Visit: Payer: 59 | Admitting: Family

## 2013-08-30 ENCOUNTER — Ambulatory Visit (INDEPENDENT_AMBULATORY_CARE_PROVIDER_SITE_OTHER): Payer: 59 | Admitting: Family

## 2013-08-30 ENCOUNTER — Encounter: Payer: Self-pay | Admitting: Family

## 2013-08-30 VITALS — BP 108/60 | HR 122 | Temp 98.3°F | Ht 69.0 in | Wt 292.0 lb

## 2013-08-30 DIAGNOSIS — F411 Generalized anxiety disorder: Secondary | ICD-10-CM

## 2013-08-30 DIAGNOSIS — M791 Myalgia, unspecified site: Secondary | ICD-10-CM

## 2013-08-30 DIAGNOSIS — M797 Fibromyalgia: Secondary | ICD-10-CM

## 2013-08-30 DIAGNOSIS — IMO0001 Reserved for inherently not codable concepts without codable children: Secondary | ICD-10-CM

## 2013-08-30 MED ORDER — DULOXETINE HCL 30 MG PO CPEP: 30.0000 mg | ORAL_CAPSULE | Freq: Two times a day (BID) | ORAL | Status: DC

## 2013-08-30 NOTE — Patient Instructions (Addendum)
First week: 30mg  Cymbalta with 50 mg Zoloft.  Second week: continue with 60 mg Cymbalta, discontinue Zoloft.   Depression, Adult Depression refers to feeling sad, low, down in the dumps, blue, gloomy, or empty. In general, there are two kinds of depression: 1. Depression that we all experience from time to time because of upsetting life experiences, including the loss of a job or the ending of a relationship (normal sadness or normal grief). This kind of depression is considered normal, is short lived, and resolves within a few days to 2 weeks. (Depression experienced after the loss of a loved one is called bereavement. Bereavement often lasts longer than 2 weeks but normally gets better with time.) 2. Clinical depression, which lasts longer than normal sadness or normal grief or interferes with your ability to function at home, at work, and in school. It also interferes with your personal relationships. It affects almost every aspect of your life. Clinical depression is an illness. Symptoms of depression also can be caused by conditions other than normal sadness and grief or clinical depression. Examples of these conditions are listed as follows:  Physical illness--Some physical illnesses, including underactive thyroid gland (hypothyroidism), severe anemia, specific types of cancer, diabetes, uncontrolled seizures, heart and lung problems, strokes, and chronic pain are commonly associated with symptoms of depression.  Side effects of some prescription medicine--In some people, certain types of prescription medicine can cause symptoms of depression.  Generalized Anxiety Disorder Generalized anxiety disorder (GAD) is a mental disorder. It interferes with life functions, including relationships, work, and school. GAD is different from normal anxiety, which everyone experiences at some point in their lives in response to specific life events and activities. Normal anxiety actually helps us prepare for  and get through these life events and activities. Normal anxiety goes away after the event or activity is over.  GAD causes anxiety that is not necessarily related to specific events or activities. It also causes excess anxiety in proportion to specific events or activities. The anxiety associated with GAD is also difficult to control. GAD can vary from mild to severe. People with severe GAD can have intense waves of anxiety with physical symptoms (panic attacks).  SYMPTOMS The anxiety and worry associated with GAD are difficult to control. This anxiety and worry are related to many life events and activities and also occur more days than not for 6 months or longer. People with GAD also have three or more of the following symptoms (one or more in children):  Restlessness.   Fatigue.  Difficulty concentrating.   Irritability.  Muscle tension.  Difficulty sleeping or unsatisfying sleep. DIAGNOSIS GAD is diagnosed through an assessment by your caregiver. Your caregiver will ask you questions aboutyour mood,physical symptoms, and events in your life. Your caregiver may ask you about your medical history and use of alcohol or drugs, including prescription medications. Your caregiver may also do a physical exam and blood tests. Certain medical conditions and the use of certain substances can cause symptoms similar to those associated with GAD. Your caregiver may refer you to a mental health specialist for further evaluation. TREATMENT The following therapies are usually used to treat GAD:   Medication--Antidepressant medication usually is prescribed for long-term daily control. Antianxiety medications may be added in severe cases, especially when panic attacks occur.   Talk therapy (psychotherapy)--Certain types of talk therapy can be helpful in treating GAD by providing support, education, and guidance. A form of talk therapy called cognitive behavioral therapy can teach you  healthy ways to  think about and react to daily life events and activities.  Stress managementtechniques--These include yoga, meditation, and exercise and can be very helpful when they are practiced regularly. A mental health specialist can help determine which treatment is best for you. Some people see improvement with one therapy. However, other people require a combination of therapies. Document Released: 06/20/2012 Document Reviewed: 06/20/2012 [facility] Patient Information 2015 [place], [place]. This information is not intended to replace advice given to you by your health care provider. Make sure you discuss any questions you have with your health care provider.   Substance abuse--Abuse of alcohol and illicit drugs can cause symptoms of depression. SYMPTOMS Symptoms of normal sadness and normal grief include the following:  Feeling sad or crying for short periods of time.  Not caring about anything (apathy).  Difficulty sleeping or sleeping too much.  No longer able to enjoy the things you used to enjoy.  Desire to be by oneself all the time (social isolation).  Lack of energy or motivation.  Difficulty concentrating or remembering.  Change in appetite or weight.  Restlessness or agitation. Symptoms of clinical depression include the same symptoms of normal sadness or normal grief and also the following symptoms:  Feeling sad or crying all the time.  Feelings of guilt or worthlessness.  Feelings of hopelessness or helplessness.  Thoughts of suicide or the desire to harm yourself (suicidal ideation).  Loss of touch with reality (psychotic symptoms). Seeing or hearing things that are not real (hallucinations) or having false beliefs about your life or the people around you (delusions and paranoia). DIAGNOSIS  The diagnosis of clinical depression usually is based on the severity and duration of the symptoms. Your caregiver also will ask you questions about your medical history and substance  use to find out if physical illness, use of prescription medicine, or substance abuse is causing your depression. Your caregiver also may order blood tests. TREATMENT  Typically, normal sadness and normal grief do not require treatment. However, sometimes antidepressant medicine is prescribed for bereavement to ease the depressive symptoms until they resolve. The treatment for clinical depression depends on the severity of your symptoms but typically includes antidepressant medicine, counseling with a mental health professional, or a combination of both. Your caregiver will help to determine what treatment is best for you. Depression caused by physical illness usually goes away with appropriate medical treatment of the illness. If prescription medicine is causing depression, talk with your caregiver about stopping the medicine, decreasing the dose, or substituting another medicine. Depression caused by abuse of alcohol or illicit drugs abuse goes away with abstinence from these substances. Some adults need professional help in order to stop drinking or using drugs. SEEK IMMEDIATE CARE IF:  You have thoughts about hurting yourself or others.  You lose touch with reality (have psychotic symptoms).  You are taking medicine for depression and have a serious side effect. FOR MORE INFORMATION National Alliance on Mental Illness: www.nami.[employer]org National Institute of Mental Health: http://www.maynard.net/www.nimh.nih.gov Document Released: 02/21/2000 Document Revised: 08/25/2011 Document Reviewed: 05/25/2011 [facility] WayExitCare Patient Information 2015 [place], [place]. This information is not intended to replace advice given to you by your health care provider. Make sure you discuss any questions you have with your health care provider.

## 2013-08-30 NOTE — Progress Notes (Signed)
Pre visit review using our clinic review tool, if applicable. No additional management support is needed unless otherwise documented below in the visit note. 

## 2013-08-30 NOTE — Progress Notes (Signed)
Subjective:    Patient ID: Amber Nielsen, female    DOB: 04/26/1985, 28 y.o.   MRN: [id number]  HPI  28 year old Caucasian female presents with complaints of muscle pains in her arms and thighs at night after a day's work. She reports her GYN office ordered tests for RA, Vit D, and thyroid, which were all negative. She works as a Associate Professorcosmetologist, lives with her husband and 2 small children. She describes her home life as safe and supportive.  [facility] and [facility] treated for bipolar disorder. She disliked the way the medication made her feel.  Of note, she reports her mother has bipolar disorder.   Review of Systems  Constitutional: Negative.   HENT: Negative.   Eyes: Negative.   Respiratory: Negative.   Cardiovascular: Negative.   Gastrointestinal: Negative.   Endocrine: Negative.   Genitourinary: Negative.   Musculoskeletal: Negative.   Allergic/Immunologic: Negative.   Neurological: Negative.   Hematological: Negative.   Psychiatric/Behavioral: Positive for sleep disturbance, dysphoric mood and agitation. Negative for suicidal ideas and self-injury. The patient is nervous/anxious and is hyperactive.    Past Medical History  Diagnosis Date  . Asthma     allergy related. rodent animals  . Obesity   . Depression   . Anxiety   . Headache(784.0)   . Gestational diabetes     diet controlled  . Pregnancy induced hypertension   . H/O varicella   . Proteinuria complicating pregnancy     History   Social History  . Marital Status: Married    Spouse Name: N/A    Number of Children: N/A  . Years of Education: N/A   Occupational History  . Not on file.   Social History Main Topics  . Smoking status: Never Smoker   . Smokeless tobacco: Never Used  . Alcohol Use: No  . Drug Use: No  . Sexual Activity: Yes   Other Topics Concern  . Not on file   Social History Narrative  . No narrative on file    Past Surgical History  Procedure  Laterality Date  . Tooth extraction      Family History  Problem Relation Age of Onset  . Heart disease Father   . Heart attack Father   . Nephrolithiasis Father   . Mental retardation Maternal Aunt   . Diabetes Maternal Grandmother   . Cancer Maternal Grandmother     ovarian and breast  . Diabetes Maternal Grandfather   . Diabetes Paternal Grandmother   . COPD Paternal Grandmother   . Hypertension Paternal Grandmother   . Diabetes Paternal Grandfather     Allergies  Allergen Reactions  . Celery Oil Swelling    Tongue swelling when eating celery    Current Outpatient Prescriptions on File Prior to Visit  Medication Sig Dispense Refill  . albuterol (PROVENTIL HFA;VENTOLIN HFA) 108 (90 BASE) MCG/ACT inhaler Inhale 1 puff into the lungs every 6 (six) hours as needed for wheezing or shortness of breath.      . Vitamin D, Ergocalciferol, (DRISDOL) 50000 UNITS CAPS Take 50,000 Units by mouth every 7 (seven) days. On Monday       No current facility-administered medications on file prior to visit.    BP 108/60  Pulse 122  Temp(Src) 98.3 F (36.8 C) (Oral)  Ht 5\' 9"  (1.753 m)  Wt 292 lb (132.45 kg)  BMI 43.10 kg/m2  SpO2 97%     Objective:  Physical Exam  Constitutional: She is oriented to person, place, and time. She appears well-developed and well-nourished.  HENT:  Head: Normocephalic and atraumatic.  Eyes: EOM are normal. Pupils are equal, round, and reactive to light.  Neck: Normal range of motion.  Cardiovascular: Normal rate, regular rhythm, normal heart sounds and intact distal pulses.  Exam reveals no gallop and no friction rub.   No murmur heard. Pulmonary/Chest: Effort normal and breath sounds normal.  Abdominal: Soft.  Musculoskeletal: Normal range of motion. She exhibits no edema.  Neurological: She is alert and oriented to person, place, and time. She has normal reflexes.  Skin: Skin is warm and dry.  Psychiatric: She has a normal mood and affect. Her  behavior is normal. Judgment and thought content normal.       Assessment & Plan:  Amber Nielsen was seen today for establish care.  Diagnoses and associated orders for this visit:  Generalized anxiety disorder - DULoxetine (CYMBALTA) 30 MG capsule; Take 1 capsule (30 mg total) by mouth 2 (two) times daily.  Generalized muscle ache

## 2013-09-14 NOTE — Telephone Encounter (Signed)
Encounter complete. 

## 2013-09-21 ENCOUNTER — Encounter (HOSPITAL_COMMUNITY): Payer: 59

## 2013-09-21 ENCOUNTER — Ambulatory Visit (HOSPITAL_COMMUNITY): Payer: 59

## 2013-09-25 ENCOUNTER — Ambulatory Visit (INDEPENDENT_AMBULATORY_CARE_PROVIDER_SITE_OTHER): Payer: BC Managed Care – PPO | Admitting: Family

## 2013-09-25 ENCOUNTER — Encounter: Payer: Self-pay | Admitting: Family

## 2013-09-25 VITALS — BP 96/70 | HR 114 | Wt 292.0 lb

## 2013-09-25 DIAGNOSIS — F32A Depression, unspecified: Secondary | ICD-10-CM

## 2013-09-25 DIAGNOSIS — F3289 Other specified depressive episodes: Secondary | ICD-10-CM

## 2013-09-25 DIAGNOSIS — F329 Major depressive disorder, single episode, unspecified: Secondary | ICD-10-CM

## 2013-09-25 DIAGNOSIS — F411 Generalized anxiety disorder: Secondary | ICD-10-CM

## 2013-09-25 MED ORDER — DULOXETINE HCL 30 MG PO CPEP: 30.0000 mg | ORAL_CAPSULE | Freq: Two times a day (BID) | ORAL | Status: DC

## 2013-09-25 NOTE — Patient Instructions (Signed)
Exercise to Stay Healthy Exercise helps you become and stay healthy. EXERCISE IDEAS AND TIPS Choose exercises that:  You enjoy.  Fit into your day. You do not need to exercise really hard to be healthy. You can do exercises at a slow or medium level and stay healthy. You can:  Stretch before and after working out.  Try yoga, Pilates, or tai chi.  Lift weights.  Walk fast, swim, jog, run, climb stairs, bicycle, dance, or rollerskate.  Take aerobic classes. Exercises that burn about 150 calories:  Running 1  miles in 15 minutes.  Playing volleyball for 45 to 60 minutes.  Washing and waxing a car for 45 to 60 minutes.  Playing touch football for 45 minutes.  Walking 1  miles in 35 minutes.  Pushing a stroller 1  miles in 30 minutes.  Playing basketball for 30 minutes.  Raking leaves for 30 minutes.  Bicycling 5 miles in 30 minutes.  Walking 2 miles in 30 minutes.  Dancing for 30 minutes.  Shoveling snow for 15 minutes.  Swimming laps for 20 minutes.  Walking up stairs for 15 minutes.  Bicycling 4 miles in 15 minutes.  Gardening for 30 to 45 minutes.  Jumping rope for 15 minutes.  Washing windows or floors for 45 to 60 minutes. Document Released: 03/28/2010 Document Revised: 05/18/2011 Document Reviewed: 03/28/2010 ExitCare Patient Information 2015 [employer]. This information is not intended to replace advice given to you by your health care provider. Make sure you discuss any questions you have with your health care provider.  

## 2013-09-25 NOTE — Progress Notes (Signed)
Subjective:    Patient ID: Amber Nielsen, female    DOB: 06/15/1985, 28 y.o.   MRN: [id number]  HPI 28 y.o. White female presents today for follow up for depression and anxiety. Pt was started on Cymbalta 60mg  last month to help improve her moods and also help with fibromyalgia pain. Pt states that she is feeling "so much better". Pt states that her moods have improved, she has more energy and she is able to deal with stress much better. Pt also states that she is no longer having the debilitating pain in her hands and knees. Pt denies fever, chills, fatigue, SOB and change in appetite.     Review of Systems  Constitutional: Negative.   Respiratory: Negative.   Cardiovascular: Negative.   Gastrointestinal: Negative.   Musculoskeletal: Negative.   Skin: Negative.   Neurological: Negative.   Psychiatric/Behavioral: Negative.    Past Medical History  Diagnosis Date  . Asthma     allergy related. rodent animals  . Obesity   . Depression   . Anxiety   . Headache(784.0)   . Gestational diabetes     diet controlled  . Pregnancy induced hypertension   . H/O varicella   . Proteinuria complicating pregnancy     History   Social History  . Marital Status: Married    Spouse Name: N/A    Number of Children: N/A  . Years of Education: N/A   Occupational History  . Not on file.   Social History Main Topics  . Smoking status: Never Smoker   . Smokeless tobacco: Never Used  . Alcohol Use: No  . Drug Use: No  . Sexual Activity: Yes   Other Topics Concern  . Not on file   Social History Narrative  . No narrative on file    Past Surgical History  Procedure Laterality Date  . Tooth extraction      Family History  Problem Relation Age of Onset  . Heart disease Father   . Heart attack Father   . Nephrolithiasis Father   . Mental retardation Maternal Aunt   . Diabetes Maternal Grandmother   . Cancer Maternal Grandmother     ovarian and breast  . Diabetes Maternal  Grandfather   . Diabetes Paternal Grandmother   . COPD Paternal Grandmother   . Hypertension Paternal Grandmother   . Diabetes Paternal Grandfather     Allergies  Allergen Reactions  . Celery Oil Swelling    Tongue swelling when eating celery    Current Outpatient Prescriptions on File Prior to Visit  Medication Sig Dispense Refill  . albuterol (PROVENTIL HFA;VENTOLIN HFA) 108 (90 BASE) MCG/ACT inhaler Inhale 1 puff into the lungs every 6 (six) hours as needed for wheezing or shortness of breath.      . ALPRAZolam (XANAX) 0.5 MG tablet Take 0.5 mg by mouth at bedtime as needed for anxiety.      . Vitamin D, Ergocalciferol, (DRISDOL) 50000 UNITS CAPS Take 50,000 Units by mouth every 7 (seven) days. On Monday       No current facility-administered medications on file prior to visit.    BP 96/70  Pulse 114  Wt 292 lb (132.45 kg)chart    Objective:   Physical Exam  Constitutional: She is oriented to person, place, and time. She appears well-developed and well-nourished. She is active.  Cardiovascular: Normal rate, regular rhythm, normal heart sounds and normal pulses.   Pulmonary/Chest: Effort normal and breath sounds normal.  Abdominal: Soft.  Normal appearance and bowel sounds are normal.  Neurological: She is alert and oriented to person, place, and time.  Skin: Skin is warm, dry and intact.  Psychiatric: She has a normal mood and affect. Her speech is normal and behavior is normal. Thought content normal. Cognition and memory are normal. She expresses no homicidal and no suicidal ideation.          Assessment & Plan:  Niaomi was seen today for follow-up.  Diagnoses and associated orders for this visit:  Depression  Generalized anxiety disorder - DULoxetine (CYMBALTA) 30 MG capsule; Take 1 capsule (30 mg total) by mouth 2 (two) times daily.  Morbid obesity   Education: Pt encouraged to keep follow up appointments with cardiology, she will have a stress test soon.  Will discuss exercising and weight loss at that time. Medications discussed in detail with patient.   Follow up: 4 weeks or sooner if needed.

## 2013-09-25 NOTE — Progress Notes (Signed)
Pre visit review using our clinic review tool, if applicable. No additional management support is needed unless otherwise documented below in the visit note. 

## 2013-10-11 ENCOUNTER — Telehealth: Payer: Self-pay | Admitting: Family

## 2013-10-11 NOTE — Telephone Encounter (Signed)
Please advise 

## 2013-10-11 NOTE — Telephone Encounter (Signed)
I can refer but given no real rationale for the referral, be aware that she may have to pay out of pocket for this visit.

## 2013-10-11 NOTE — Telephone Encounter (Signed)
Pt is requesting a referral to see a rheumatology, pt [facility] Pt has bcbs, states her family has a history of ra and she just want to see a specialist.

## 2013-10-12 ENCOUNTER — Ambulatory Visit (INDEPENDENT_AMBULATORY_CARE_PROVIDER_SITE_OTHER): Payer: BC Managed Care – PPO | Admitting: Family Medicine

## 2013-10-12 ENCOUNTER — Encounter: Payer: Self-pay | Admitting: Family Medicine

## 2013-10-12 VITALS — BP 120/78 | HR 90 | Temp 98.1°F | Wt 187.0 lb

## 2013-10-12 DIAGNOSIS — M791 Myalgia, unspecified site: Secondary | ICD-10-CM

## 2013-10-12 DIAGNOSIS — IMO0001 Reserved for inherently not codable concepts without codable children: Secondary | ICD-10-CM

## 2013-10-12 NOTE — Telephone Encounter (Signed)
Left message on personally identified voicemail to advise pt of Padonda's note. Advised her to call back to let me know if she still wants the referral

## 2013-10-12 NOTE — Patient Instructions (Signed)
Increase exercise to 30 minutes a day at least 5x a week.  Continue cymbalta at this time.  Refer to rheumatology

## 2013-10-12 NOTE — Progress Notes (Signed)
  Amber ConchStephen Vandell Kun, MD Phone: [phone]  Subjective:   Amber MinorKellie S Nielsen is a 28 y.o. year old very pleasant female patient who presents with the following:  Muscle and Joint aches Diffuse pain. Been evaluated by ob/gyn and reportedly had no abnormalities on workup. Seen by PCP who thought this was related to GAD and muscle aches. Trial of cymbalta caused nearly 100% improvement in symptoms on follow up a month later in chart.   Per patient, cymbalta at first very effective for full day except by 7-8pm started to have some pains (Hands, wrists, and elbows, also down the middle of the back as well as muscles of arms and upper legs). Similar pains to this since late teenage years, recently worsening. Yesterday morning and several mornings, has had worsening pain in above described areas worse than before she started cymbalta. Feels better taking it all at once compared to taking it 1/2 BID. Complains of stiffness in AM for at least an hour. Describes symptoms better with pregnancy. She is very focused on the idea that she has an autoimmune disorder.  ROS- no fevers, chills, fatigue/malaise, nausea/vomiting, or recent weight change.   Past Medical Hist2ory- GAD, morbid obesity, gestational diabetes history Family history- grandfather with rheumatoid arthritis.   Medications- reviewed and updated Current Outpatient Prescriptions  Medication Sig Dispense Refill  . ALPRAZolam (XANAX) 0.5 MG tablet Take 0.5 mg by mouth at bedtime as needed for anxiety.      . DULoxetine (CYMBALTA) 30 MG capsule Take 1 capsule (30 mg total) by mouth 2 (two) times daily.  60 capsule  0  . Vitamin D, Ergocalciferol, (DRISDOL) 50000 UNITS CAPS Take 50,000 Units by mouth every 7 (seven) days. On Monday      . albuterol (PROVENTIL HFA;VENTOLIN HFA) 108 (90 BASE) MCG/ACT inhaler Inhale 1 puff into the lungs every 6 (six) hours as needed for wheezing or shortness of breath.       No current facility-administered medications  for this visit.    Objective: BP 120/78  Pulse 90  Temp(Src) 98.1 F (36.7 C)  Wt 187 lb (84.823 kg) Gen: NAD, resting comfortably in chair, appears in no discomfort CV: RRR no murmurs rubs or gallops Lungs: CTAB no crackles, wheeze, rhonchi Abdomen: soft/nontender/nondistended/normal bowel sounds. No rebound or guarding.  Ext: no edema MSK: movement of joints and palpation of muscle groups and arms and legs with no visual distress, patient reports mild soreness with this. No joint swelling or deformity.   Assessment/Plan:  Muscle and Joint aches Previously improved with cymbalta, now no longer seems to be helping as much. She asks for rheumatology referral. I discussed with patient that I doubt an autoimmune disorder. Fibromyalgia would like lead differential so I instructed her to exercise 30 minutes 5x a week and continue cymbalta. She asked about changing medication which I deferred to rheumatology or follow up with PCP.

## 2013-10-17 ENCOUNTER — Ambulatory Visit: Payer: Self-pay | Admitting: Family

## 2013-10-24 ENCOUNTER — Ambulatory Visit: Payer: Self-pay | Admitting: Family

## 2013-11-02 ENCOUNTER — Other Ambulatory Visit: Payer: Self-pay | Admitting: Family

## 2013-11-29 ENCOUNTER — Encounter: Payer: Self-pay | Admitting: Family

## 2013-11-29 ENCOUNTER — Ambulatory Visit (INDEPENDENT_AMBULATORY_CARE_PROVIDER_SITE_OTHER): Payer: BC Managed Care – PPO | Admitting: Family

## 2013-11-29 VITALS — BP 108/60 | HR 85 | Wt 285.0 lb

## 2013-11-29 DIAGNOSIS — F3289 Other specified depressive episodes: Secondary | ICD-10-CM

## 2013-11-29 DIAGNOSIS — F411 Generalized anxiety disorder: Secondary | ICD-10-CM

## 2013-11-29 DIAGNOSIS — F32A Depression, unspecified: Secondary | ICD-10-CM

## 2013-11-29 DIAGNOSIS — F329 Major depressive disorder, single episode, unspecified: Secondary | ICD-10-CM

## 2013-11-29 MED ORDER — ARIPIPRAZOLE 5 MG PO TABS: 5.0000 mg | ORAL_TABLET | Freq: Every day | ORAL | Status: DC

## 2013-11-29 MED ORDER — CLONAZEPAM 0.5 MG PO TABS: 0.5000 mg | ORAL_TABLET | Freq: Two times a day (BID) | ORAL | Status: DC | PRN

## 2013-11-29 MED ORDER — DULOXETINE HCL 60 MG PO CPEP: ORAL_CAPSULE | ORAL | Status: DC

## 2013-11-29 NOTE — Progress Notes (Signed)
Pre visit review using our clinic review tool, if applicable. No additional management support is needed unless otherwise documented below in the visit note. 

## 2013-11-29 NOTE — Progress Notes (Signed)
Subjective:    Patient ID: Amber Nielsen, female    DOB: 1985/09/20, 28 y.o.   MRN: [id number]  Anxiety Symptoms include nervous/anxious behavior.     28 year old white female, with a history of anxiety and depression is in today with worsening depression. 2 months ago she started Cymbalta that was working really well and all of a sudden is not working as well as it once was. Reports feeling down all the time, increased anxiety and panic attacks, crying spells, and feel and argumentative. In the past, bipolar disorder has been mentioned. Has never been on a mood stabilizer. Had Xanax when necessary she reports is not helping her symptoms. Has difficulty sleeping at night. Has feelings of helplessness and hopelessness but denies any thoughts of suicide.   Review of Systems  Constitutional: Negative.   HENT: Negative.   Respiratory: Negative.   Cardiovascular: Negative.   Gastrointestinal: Negative.   Endocrine: Negative.   Genitourinary: Negative.   Musculoskeletal: Negative.   Skin: Negative.   Neurological: Negative.   Hematological: Negative.   Psychiatric/Behavioral: Positive for sleep disturbance and agitation. The patient is nervous/anxious.    Past Medical History  Diagnosis Date  . Asthma     allergy related. rodent animals  . Obesity   . Depression   . Anxiety   . Headache(784.0)   . Gestational diabetes     diet controlled  . Pregnancy induced hypertension   . H/O varicella   . Proteinuria complicating pregnancy     History   Social History  . Marital Status: Married    Spouse Name: N/A    Number of Children: N/A  . Years of Education: N/A   Occupational History  . Not on file.   Social History Main Topics  . Smoking status: Never Smoker   . Smokeless tobacco: Never Used  . Alcohol Use: No  . Drug Use: No  . Sexual Activity: Yes   Other Topics Concern  . Not on file   Social History Narrative  . No narrative on file    Past Surgical  History  Procedure Laterality Date  . Tooth extraction      Family History  Problem Relation Age of Onset  . Heart disease Father   . Heart attack Father   . Nephrolithiasis Father   . Mental retardation Maternal Aunt   . Diabetes Maternal Grandmother   . Cancer Maternal Grandmother     ovarian and breast  . Diabetes Maternal Grandfather   . Diabetes Paternal Grandmother   . COPD Paternal Grandmother   . Hypertension Paternal Grandmother   . Diabetes Paternal Grandfather     Allergies  Allergen Reactions  . Celery Oil Swelling    Tongue swelling when eating celery    Current Outpatient Prescriptions on File Prior to Visit  Medication Sig Dispense Refill  . albuterol (PROVENTIL HFA;VENTOLIN HFA) 108 (90 BASE) MCG/ACT inhaler Inhale 1 puff into the lungs every 6 (six) hours as needed for wheezing or shortness of breath.      . Vitamin D, Ergocalciferol, (DRISDOL) 50000 UNITS CAPS Take 50,000 Units by mouth every 7 (seven) days. On Monday       No current facility-administered medications on file prior to visit.    BP 108/60  Pulse 85  Wt 285 lb (129.275 kg)chart    Objective:   Physical Exam  Constitutional: She is oriented to person, place, and time. She appears well-developed and well-nourished.  HENT:  Right Ear: External  ear normal.  Left Ear: External ear normal.  Nose: Nose normal.  Mouth/Throat: Oropharynx is clear and moist.  Neck: Normal range of motion. Neck supple. No thyromegaly present.  Cardiovascular: Normal rate, regular rhythm and normal heart sounds.   Pulmonary/Chest: Effort normal and breath sounds normal.  Abdominal: Soft.  Musculoskeletal: Normal range of motion.  Neurological: She is alert and oriented to person, place, and time.  Skin: Skin is warm and dry.  Psychiatric:  Tearful          Assessment & Plan:  Amber Nielsen was seen today for depression and anxiety.  Diagnoses and associated orders for this visit:  Depression  Anxiety  state, unspecified  Other Orders - DULoxetine (CYMBALTA) 60 MG capsule; 2 by mouth daily - clonazePAM (KLONOPIN) 0.5 MG tablet; Take 1 tablet (0.5 mg total) by mouth 2 (two) times daily as needed for anxiety. - ARIPiprazole (ABILIFY) 5 MG tablet; Take 1 tablet (5 mg total) by mouth daily.    Increase Cymbalta to 120 mg once daily. In one week, will at a mood stabilizer, Abilify 5 mg once daily. Advise psychotherapy again. Patient declined. Encouraged her to increase exercise to one hour 3 times per week.

## 2013-11-29 NOTE — Patient Instructions (Signed)
Generalized Anxiety Disorder Generalized anxiety disorder (GAD) is a mental disorder. It interferes with life functions, including relationships, work, and school. GAD is different from normal anxiety, which everyone experiences at some point in their lives in response to specific life events and activities. Normal anxiety actually helps us prepare for and get through these life events and activities. Normal anxiety goes away after the event or activity is over.  GAD causes anxiety that is not necessarily related to specific events or activities. It also causes excess anxiety in proportion to specific events or activities. The anxiety associated with GAD is also difficult to control. GAD can vary from mild to severe. People with severe GAD can have intense waves of anxiety with physical symptoms (panic attacks).  SYMPTOMS The anxiety and worry associated with GAD are difficult to control. This anxiety and worry are related to many life events and activities and also occur more days than not for 6 months or longer. People with GAD also have three or more of the following symptoms (one or more in children):  Restlessness.   Fatigue.  Difficulty concentrating.   Irritability.  Muscle tension.  Difficulty sleeping or unsatisfying sleep. DIAGNOSIS GAD is diagnosed through an assessment by your health care provider. Your health care provider will ask you questions aboutyour mood,physical symptoms, and events in your life. Your health care provider may ask you about your medical history and use of alcohol or drugs, including prescription medicines. Your health care provider may also do a physical exam and blood tests. Certain medical conditions and the use of certain substances can cause symptoms similar to those associated with GAD. Your health care provider may refer you to a mental health specialist for further evaluation. TREATMENT The following therapies are usually used to treat GAD:    Medication. Antidepressant medication usually is prescribed for long-term daily control. Antianxiety medicines may be added in severe cases, especially when panic attacks occur.   Talk therapy (psychotherapy). Certain types of talk therapy can be helpful in treating GAD by providing support, education, and guidance. A form of talk therapy called cognitive behavioral therapy can teach you healthy ways to think about and react to daily life events and activities.  Stress managementtechniques. These include yoga, meditation, and exercise and can be very helpful when they are practiced regularly. A mental health specialist can help determine which treatment is best for you. Some people see improvement with one therapy. However, other people require a combination of therapies. Document Released: 06/20/2012 Document Revised: 07/10/2013 Document Reviewed: 06/20/2012 Exit[employer] Patient Information 2015 [employer]. This information is not intended to replace advice given to you by your health care provider. Make sure you discuss any questions you have with your health care provider.  

## 2013-12-13 ENCOUNTER — Encounter: Payer: Self-pay | Admitting: Family

## 2013-12-13 ENCOUNTER — Ambulatory Visit (INDEPENDENT_AMBULATORY_CARE_PROVIDER_SITE_OTHER): Payer: No Typology Code available for payment source | Admitting: Family

## 2013-12-13 VITALS — BP 120/70 | HR 93 | Wt 281.0 lb

## 2013-12-13 DIAGNOSIS — Z23 Encounter for immunization: Secondary | ICD-10-CM

## 2013-12-13 DIAGNOSIS — F411 Generalized anxiety disorder: Secondary | ICD-10-CM

## 2013-12-13 DIAGNOSIS — F329 Major depressive disorder, single episode, unspecified: Secondary | ICD-10-CM

## 2013-12-13 DIAGNOSIS — F32A Depression, unspecified: Secondary | ICD-10-CM

## 2013-12-13 NOTE — Progress Notes (Signed)
Pre visit review using our clinic review tool, if applicable. No additional management support is needed unless otherwise documented below in the visit note. 

## 2013-12-13 NOTE — Progress Notes (Signed)
Subjective:    Patient ID: Amber Nielsen, female    DOB: 07/23/1985, 28 y.o.   MRN: [id number]  HPI  [age] her old white female, nonsmoker is in today for recheck of depression and anxiety. At her last office visit we increase Cymbalta to 120 mg once daily and added Abilify 5 mg. Patient reports that she is doing well. Has some drowsiness that is improved over the last couple of days. Likely continue medication. Denies any feelings of helplessness, hopelessness, thoughts of death or dying.  Review of Systems  Constitutional: Negative.   Respiratory: Negative.   Cardiovascular: Negative.   Gastrointestinal: Negative.   Endocrine: Negative.   Genitourinary: Negative.   Musculoskeletal: Negative.   Allergic/Immunologic: Negative.   Neurological: Negative.   Psychiatric/Behavioral: Negative.    Past Medical History  Diagnosis Date  . Asthma     allergy related. rodent animals  . Obesity   . Depression   . Anxiety   . Headache(784.0)   . Gestational diabetes     diet controlled  . Pregnancy induced hypertension   . H/O varicella   . Proteinuria complicating pregnancy     History   Social History  . Marital Status: Married    Spouse Name: N/A    Number of Children: N/A  . Years of Education: N/A   Occupational History  . Not on file.   Social History Main Topics  . Smoking status: Never Smoker   . Smokeless tobacco: Never Used  . Alcohol Use: No  . Drug Use: No  . Sexual Activity: Yes   Other Topics Concern  . Not on file   Social History Narrative  . No narrative on file    Past Surgical History  Procedure Laterality Date  . Tooth extraction      Family History  Problem Relation Age of Onset  . Heart disease Father   . Heart attack Father   . Nephrolithiasis Father   . Mental retardation Maternal Aunt   . Diabetes Maternal Grandmother   . Cancer Maternal Grandmother     ovarian and breast  . Diabetes Maternal Grandfather   . Diabetes Paternal  Grandmother   . COPD Paternal Grandmother   . Hypertension Paternal Grandmother   . Diabetes Paternal Grandfather     Allergies  Allergen Reactions  . Celery Oil Swelling    Tongue swelling when eating celery    Current Outpatient Prescriptions on File Prior to Visit  Medication Sig Dispense Refill  . albuterol (PROVENTIL HFA;VENTOLIN HFA) 108 (90 BASE) MCG/ACT inhaler Inhale 1 puff into the lungs every 6 (six) hours as needed for wheezing or shortness of breath.      . ARIPiprazole (ABILIFY) 5 MG tablet Take 1 tablet (5 mg total) by mouth daily.  30 tablet  1  . clonazePAM (KLONOPIN) 0.5 MG tablet Take 1 tablet (0.5 mg total) by mouth 2 (two) times daily as needed for anxiety.  30 tablet  1  . DULoxetine (CYMBALTA) 60 MG capsule 2 by mouth daily  60 capsule  1  . Vitamin D, Ergocalciferol, (DRISDOL) 50000 UNITS CAPS Take 50,000 Units by mouth every 7 (seven) days. On Monday       No current facility-administered medications on file prior to visit.    BP 120/70  Pulse 93  Wt 281 lb (127.461 kg)chart     Objective:   Physical Exam  Constitutional: She is oriented to person, place, and time. She appears well-developed and well-nourished.  Neck: Normal range of motion. Neck supple. No thyromegaly present.  Cardiovascular: Normal rate, regular rhythm and normal heart sounds.   Pulmonary/Chest: Effort normal and breath sounds normal.  Musculoskeletal: Normal range of motion.  Neurological: She is alert and oriented to person, place, and time.  Skin: Skin is warm and dry.  Psychiatric: She has a normal mood and affect.          Assessment & Plan:  Amber Nielsen was seen today for follow-up.  Diagnoses and associated orders for this visit:  Depression  Encounter for immunization  Generalized anxiety disorder    call the office with any questions or concerns. Recheck in 4 weeks and sooner as needed.

## 2013-12-13 NOTE — Patient Instructions (Signed)
Exercise to Stay Healthy Exercise helps you become and stay healthy. EXERCISE IDEAS AND TIPS Choose exercises that:  You enjoy.  Fit into your day. You do not need to exercise really hard to be healthy. You can do exercises at a slow or medium level and stay healthy. You can:  Stretch before and after working out.  Try yoga, Pilates, or tai chi.  Lift weights.  Walk fast, swim, jog, run, climb stairs, bicycle, dance, or rollerskate.  Take aerobic classes. Exercises that burn about 150 calories:  Running 1  miles in 15 minutes.  Playing volleyball for 45 to 60 minutes.  Washing and waxing a car for 45 to 60 minutes.  Playing touch football for 45 minutes.  Walking 1  miles in 35 minutes.  Pushing a stroller 1  miles in 30 minutes.  Playing basketball for 30 minutes.  Raking leaves for 30 minutes.  Bicycling 5 miles in 30 minutes.  Walking 2 miles in 30 minutes.  Dancing for 30 minutes.  Shoveling snow for 15 minutes.  Swimming laps for 20 minutes.  Walking up stairs for 15 minutes.  Bicycling 4 miles in 15 minutes.  Gardening for 30 to 45 minutes.  Jumping rope for 15 minutes.  Washing windows or floors for 45 to 60 minutes. Document Released: 03/28/2010 Document Revised: 05/18/2011 Document Reviewed: 03/28/2010 ExitCare Patient Information 2015 [employer]. This information is not intended to replace advice given to you by your health care provider. Make sure you discuss any questions you have with your health care provider.  

## 2014-01-01 ENCOUNTER — Telehealth: Payer: Self-pay | Admitting: Family

## 2014-01-01 NOTE — Telephone Encounter (Signed)
Pt states she does not think the ARIPiprazole (ABILIFY) 5 MG tablet is the right med for her. Pt states she almost falls asleep at the wheel, extremely fatiqued, falls asleep when she sits down. She has been taking the med at bedtime, and still not better.  Cannot come in for appt until after 11/1 due to insurance. Would like a cb [facility]

## 2014-01-02 NOTE — Telephone Encounter (Signed)
Pt aware and will schedule appointment for 3-4 weeks

## 2014-01-02 NOTE — Telephone Encounter (Signed)
Please advise 

## 2014-01-02 NOTE — Telephone Encounter (Signed)
Stop abilify. Continue Cymbalta. We can discuss treatment options after she is off Abilify for a while.

## 2014-01-08 ENCOUNTER — Encounter: Payer: Self-pay | Admitting: Family

## 2014-01-08 ENCOUNTER — Telehealth: Payer: Self-pay | Admitting: Family

## 2014-01-08 NOTE — Telephone Encounter (Signed)
lmom for pt to cb to sch appt °

## 2014-01-08 NOTE — Telephone Encounter (Signed)
Pt will call back to sch.

## 2014-01-08 NOTE — Telephone Encounter (Signed)
No appointment available for follow up this afternoon. Please schedule next available follow up. Thanks!

## 2014-01-08 NOTE — Telephone Encounter (Signed)
Pt is requesting appt this afternoon to follow up on meds. Pt stated she is having bad panic attacks and she is bipolar. Can I  Use coumadin slot?

## 2014-01-10 ENCOUNTER — Ambulatory Visit (INDEPENDENT_AMBULATORY_CARE_PROVIDER_SITE_OTHER): Payer: No Typology Code available for payment source | Admitting: Nurse Practitioner

## 2014-01-10 ENCOUNTER — Encounter: Payer: Self-pay | Admitting: Nurse Practitioner

## 2014-01-10 VITALS — BP 119/83 | HR 84 | Temp 98.1°F | Resp 18 | Ht 69.0 in | Wt 276.0 lb

## 2014-01-10 DIAGNOSIS — F3132 Bipolar disorder, current episode depressed, moderate: Secondary | ICD-10-CM | POA: Insufficient documentation

## 2014-01-10 DIAGNOSIS — F419 Anxiety disorder, unspecified: Secondary | ICD-10-CM

## 2014-01-10 MED ORDER — LURASIDONE HCL 40 MG PO TABS: 40.0000 mg | ORAL_TABLET | Freq: Every day | ORAL | Status: DC

## 2014-01-10 MED ORDER — LORAZEPAM 0.5 MG PO TABS: 0.5000 mg | ORAL_TABLET | Freq: Two times a day (BID) | ORAL | Status: DC | PRN

## 2014-01-10 NOTE — Progress Notes (Signed)
Pre visit review using our clinic review tool, if applicable. No additional management support is needed unless otherwise documented below in the visit note. 

## 2014-01-10 NOTE — Progress Notes (Signed)
Subjective:   Amber Nielsen is an 28 y.o. female who presents for evaluation and treatment of depressive symptoms.  Amber Nielsen states she has a hx of bipolar disorder. She was treated by Dr Jennelle Humanottle for a few years. She stopped seeing him due to pt didn't feel there was good report. Past meds include latuda, prozac, depakote. Recently she has been treated by PCP: meds include cymbalta 120 mg qd, abilify 5 mg qd, & klonopin. Abilify was stopped about 1 mo ago due to overwhelming fatigue & sleepiness. Cymbalta was increased to 120 mg from 60 mg about 1 mo ago. Pt thinks depression got worse after dose increase. She ran out of cymbalta 2 days ago. She has continued to take klonopin. She recognizes worsened depression as unable to keep house & car clean, behind on laundry, doesn't want to get up & go. She has a 2 & 26 YO at home.  She is c/o itching all over & states this happens when her anxiety is heightened. She shows me her legs-multiple sores. She admits to picking.  Several recent events are likely contributing to worse anxiety & depressed mood: husband took new job-now works from [employer]4pm to [employer]am sun-thurs. She feels overwhelmed w/taking care of children in his absence; lost her job 3 da due to depressed mood; had fight with friend who told her she is not a good mom.  She is tearful during OV. She expresses desire to see psychiatry again. She denies SI or HI.  Pos fam Hx : mother bipolar & substance abuse, Pat uncle bipolar.    Assessment:  1. Bipolar affective disorder, currently depressed, moderate - Ambulatory referral to Psychiatry. APPT 11/23 2:30 [facility] - lurasidone (LATUDA) 40 MG TABS tablet; Take 1 tablet (40 mg total) by mouth daily with breakfast.  Dispense: 30 tablet; Refill: 1 - CBC with Differential - Comprehensive metabolic panel - T4, free - Thyroid peroxidase antibody - TSH  2. Anxiety disorder, unspecified anxiety disorder type - LORazepam (ATIVAN) 0.5 MG tablet; Take 1  tablet (0.5 mg total) by mouth 2 (two) times daily as needed for anxiety.  Dispense: 30 tablet; Refill: 1  Suicide Risk Assessment: denies SI  F/u in ofc in 2 weeks. Pt understands [facility] if in crisis as discussed & reinforced in writing. See pt instructions.

## 2014-01-10 NOTE — Patient Instructions (Addendum)
Start latuda today. OK to take lorazepam as needed.  You have behavioral [facility] 2[facility]30 [facility].  If you are feeling like you want to hurt self or others, or cannot stop crying, or unable to care for self or children, please go to [facility] for [facility] evaluation.  Make schedule for yourself & kids, refer to it when you feel overwhelmed. This will give you order, consistency, and a plan. Remember, some of what you are feeling is normal. Taking care of little ones can feel lonely & chaotic. Plan play dates & activities, & time to yourself.  Return in 2 weeks.

## 2014-01-11 LAB — CBC WITH DIFFERENTIAL/PLATELET
Basophils Absolute: 0 10*3/uL (ref 0.0–0.1)
Basophils Relative: 0.2 % (ref 0.0–3.0)
Eosinophils Absolute: 0.3 10*3/uL (ref 0.0–0.7)
Eosinophils Relative: 3 % (ref 0.0–5.0)
HCT: 41 % (ref 36.0–46.0)
Hemoglobin: 13.3 g/dL (ref 12.0–15.0)
Lymphocytes Relative: 18.4 % (ref 12.0–46.0)
Lymphs Abs: 1.7 10*3/uL (ref 0.7–4.0)
MCHC: 32.5 g/dL (ref 30.0–36.0)
MCV: 89.6 fl (ref 78.0–100.0)
Monocytes Absolute: 0.7 10*3/uL (ref 0.1–1.0)
Monocytes Relative: 8.1 % (ref 3.0–12.0)
Neutro Abs: 6.3 10*3/uL (ref 1.4–7.7)
Neutrophils Relative %: 70.3 % (ref 43.0–77.0)
Platelets: 302 10*3/uL (ref 150.0–400.0)
RBC: 4.57 Mil/uL (ref 3.87–5.11)
RDW: 13.5 % (ref 11.5–15.5)
WBC: 9 10*3/uL (ref 4.0–10.5)

## 2014-01-11 LAB — COMPREHENSIVE METABOLIC PANEL
ALT: 36 U/L — ABNORMAL HIGH (ref 0–35)
AST: 19 U/L (ref 0–37)
Albumin: 3.3 g/dL — ABNORMAL LOW (ref 3.5–5.2)
Alkaline Phosphatase: 69 U/L (ref 39–117)
BUN: 10 mg/dL (ref 6–23)
CO2: 27 mEq/L (ref 19–32)
Calcium: 9.3 mg/dL (ref 8.4–10.5)
Chloride: 105 mEq/L (ref 96–112)
Creatinine, Ser: 0.8 mg/dL (ref 0.4–1.2)
GFR: 99.01 mL/min (ref >60.00)
Glucose, Bld: 124 mg/dL — ABNORMAL HIGH (ref 70–99)
Potassium: 4.2 mEq/L (ref 3.5–5.1)
Sodium: 140 mEq/L (ref 135–145)
Total Bilirubin: 0.2 mg/dL (ref 0.2–1.2)
Total Protein: 7.3 g/dL (ref 6.0–8.3)

## 2014-01-11 LAB — THYROID PEROXIDASE ANTIBODY: Thyroperoxidase Ab SerPl-aCnc: 4 IU/mL (ref <9)

## 2014-01-11 LAB — TSH: TSH: 1.2 u[IU]/mL (ref 0.35–4.50)

## 2014-01-11 LAB — T4, FREE: Free T4: 0.91 ng/dL (ref 0.60–1.60)

## 2014-01-12 ENCOUNTER — Encounter: Payer: Self-pay | Admitting: Family Medicine

## 2014-01-12 ENCOUNTER — Ambulatory Visit (INDEPENDENT_AMBULATORY_CARE_PROVIDER_SITE_OTHER): Payer: No Typology Code available for payment source | Admitting: Family Medicine

## 2014-01-12 VITALS — BP 116/70 | HR 103 | Temp 97.8°F | Wt 278.0 lb

## 2014-01-12 DIAGNOSIS — R739 Hyperglycemia, unspecified: Secondary | ICD-10-CM

## 2014-01-12 DIAGNOSIS — B9789 Other viral agents as the cause of diseases classified elsewhere: Principal | ICD-10-CM

## 2014-01-12 DIAGNOSIS — J069 Acute upper respiratory infection, unspecified: Secondary | ICD-10-CM

## 2014-01-12 LAB — GLUCOSE, POCT (MANUAL RESULT ENTRY): POC Glucose: 132 mg/dl — AB (ref 70–99)

## 2014-01-12 MED ORDER — BENZONATATE 200 MG PO CAPS: 200.0000 mg | ORAL_CAPSULE | Freq: Three times a day (TID) | ORAL | Status: DC | PRN

## 2014-01-12 NOTE — Progress Notes (Signed)
Pre visit review using our clinic review tool, if applicable. No additional management support is needed unless otherwise documented below in the visit note. 

## 2014-01-12 NOTE — Patient Instructions (Signed)

## 2014-01-12 NOTE — Progress Notes (Addendum)
Subjective:    Patient ID: Amber Nielsen, female    DOB: 11/28/1985, 28 y.o.   MRN: [id number]  HPI 28 year old female presents with cough x 1 week.  Began as dry hacking "cottonball" sensation in the throat.  Progressively gotten worse and awakens her many times throughout the night.  Sometimes coughing so hard causes vomiting, incontinence or blurry vision spots.  Denies fevers or changes in appetite.  Has needed albuterol inhaler more often; using twice a day.  Has not needed any today.  Also noting facial pressures, ear fullness and upper teeth tenderness.  No SOB or chest pains.  Non smoker.  Has tried sudafed and theraflu with little relief.  Already got flu shot this year.    Last minute, she mentioned that she was recently started on latuda for bipolar.  Was tolerated in the past and is now resuming medication.  She had an episode of hyperglycemia in the 170 range earlier today.  No other symptoms have been present.  She states "it could just be my body adjusting back to the medication."   Review of Systems  Constitutional: Negative for fever, activity change, appetite change and fatigue.  HENT: Positive for congestion, dental problem (tenderness), ear pain (sensation of fullness) and sinus pressure. Negative for facial swelling, hearing loss, postnasal drip, rhinorrhea and sore throat.   Eyes: Negative for visual disturbance.  Respiratory: Positive for cough (non productive) and wheezing (occasionally; requires inhaler ). Negative for chest tightness and shortness of breath.   Cardiovascular: Negative for chest pain.  Gastrointestinal: Positive for vomiting (only with extreme coughing spells). Negative for nausea, abdominal pain, diarrhea, constipation and abdominal distention.  Neurological: Positive for headaches. Negative for dizziness, weakness and light-headedness.   Past Medical History  Diagnosis Date  . Asthma     allergy related. rodent animals  . Obesity   . Depression     . Anxiety   . Headache(784.0)   . Gestational diabetes     diet controlled  . Pregnancy induced hypertension   . H/O varicella   . Proteinuria complicating pregnancy    Current Outpatient Prescriptions on File Prior to Visit  Medication Sig Dispense Refill  . albuterol (PROVENTIL HFA;VENTOLIN HFA) 108 (90 BASE) MCG/ACT inhaler Inhale 1 puff into the lungs every 6 (six) hours as needed for wheezing or shortness of breath.    . clonazePAM (KLONOPIN) 0.5 MG tablet Take 1 tablet (0.5 mg total) by mouth 2 (two) times daily as needed for anxiety. 30 tablet 1  . LORazepam (ATIVAN) 0.5 MG tablet Take 1 tablet (0.5 mg total) by mouth 2 (two) times daily as needed for anxiety. 30 tablet 1  . lurasidone (LATUDA) 40 MG TABS tablet Take 1 tablet (40 mg total) by mouth daily with breakfast. 30 tablet 1  . Vitamin D, Ergocalciferol, (DRISDOL) 50000 UNITS CAPS Take 50,000 Units by mouth every 7 (seven) days. On Monday     No current facility-administered medications on file prior to visit.   Family History  Problem Relation Age of Onset  . Heart disease Father   . Heart attack Father   . Nephrolithiasis Father   . Mental retardation Maternal Aunt   . Diabetes Maternal Grandmother   . Cancer Maternal Grandmother     ovarian and breast  . Diabetes Maternal Grandfather   . Diabetes Paternal Grandmother   . COPD Paternal Grandmother   . Hypertension Paternal Grandmother   . Diabetes Paternal Grandfather  History   Social History  . Marital Status: Married    Spouse Name: N/A    Number of Children: N/A  . Years of Education: N/A   Occupational History  . Not on file.   Social History Main Topics  . Smoking status: Never Smoker   . Smokeless tobacco: Never Used  . Alcohol Use: No  . Drug Use: No  . Sexual Activity: Yes   Other Topics Concern  . Not on file   Social History Narrative        Objective:   Physical Exam  Constitutional: She is oriented to person, place, and  time. She appears well-developed and well-nourished. No distress.  HENT:  Head: Normocephalic and atraumatic.  Right Ear: External ear normal.  Left Ear: External ear normal.  Mouth/Throat: Oropharynx is clear and moist. No oropharyngeal exudate.  Eyes: Conjunctivae are normal. Pupils are equal, round, and reactive to light. Right eye exhibits no discharge. Left eye exhibits no discharge.  Neck: Normal range of motion. Neck supple. No JVD present. No tracheal deviation present. No thyromegaly present.  Cardiovascular: Normal rate, regular rhythm and normal heart sounds.   No murmur heard. Pulmonary/Chest: Breath sounds normal. No respiratory distress. She has no wheezes.  Lymphadenopathy:    She has no cervical adenopathy.  Neurological: She is alert and oriented to person, place, and time.  Skin: She is not diaphoretic.  Nursing note and vitals reviewed.       Assessment & Plan:  1.  Viral URI with cough- started on tessalon pearls.  Continue to stay well hydrated and get plenty of rest.  She was educated that viral coughs can take a long time to resolve.  Was told to return to clinic if symptoms worsen and a fever develops.    2.  Hyperglycemia- POC glucose was drawn for last minute complaints of feeling hyperglycemic with starting new medication of latuda for bipolar.  Glucose was 132 in office.  She agreed to monitor levels and see what the pattern does at home before starting metformin.  Follow up later this month with psychiatrist for medication adjustments regarding bipolar if need be.   Luiz OchoaMelissa Jamiere Gulas, PA-S  Agree with above. Evelena PeatBruce Burchette MD

## 2014-01-15 ENCOUNTER — Encounter: Payer: Self-pay | Admitting: Family

## 2014-01-15 ENCOUNTER — Telehealth: Payer: Self-pay | Admitting: Nurse Practitioner

## 2014-01-15 NOTE — Telephone Encounter (Signed)
Will forward labs to her PCP.

## 2014-01-16 ENCOUNTER — Other Ambulatory Visit: Payer: Self-pay | Admitting: Family

## 2014-01-16 MED ORDER — DOXYCYCLINE HYCLATE 100 MG PO TABS: 100.0000 mg | ORAL_TABLET | Freq: Two times a day (BID) | ORAL | Status: DC

## 2014-01-26 ENCOUNTER — Encounter: Payer: Self-pay | Admitting: Family Medicine

## 2014-01-26 ENCOUNTER — Ambulatory Visit (INDEPENDENT_AMBULATORY_CARE_PROVIDER_SITE_OTHER): Payer: No Typology Code available for payment source | Admitting: Family Medicine

## 2014-01-26 VITALS — BP 106/74 | HR 70 | Temp 99.0°F | Ht 69.0 in | Wt 274.0 lb

## 2014-01-26 DIAGNOSIS — O24419 Gestational diabetes mellitus in pregnancy, unspecified control: Secondary | ICD-10-CM

## 2014-01-26 DIAGNOSIS — F3132 Bipolar disorder, current episode depressed, moderate: Secondary | ICD-10-CM

## 2014-01-26 MED ORDER — LAMOTRIGINE 25 MG PO TABS: 50.0000 mg | ORAL_TABLET | Freq: Every day | ORAL | Status: DC

## 2014-01-26 NOTE — Progress Notes (Signed)
   Subjective:    Patient ID: Amber Nielsen, female    DOB: 04/13/1985, 28 y.o.   MRN: [id number]  HPI Here to discuss possible medication side effects. She sees Psychiatry for bipolar disorder, and on 01-10-14 she saw Maximino SarinLayne Weaver NP who started her on JordanLatuda. The very next day she began to feel tired and lightheaded, and she thinks it causes her glucoses to go up. Her fasting glucoses this week have been in the range of 150 to 200. She tried this once before and the same thing happened. She asks for this to be changed to something else because she cannot see her Psychiatrist until mid December. Her moods have been stable.    Review of Systems  Constitutional: Positive for fatigue.  Respiratory: Negative.   Cardiovascular: Negative.   Neurological: Positive for light-headedness. Negative for dizziness, tremors, seizures, syncope, facial asymmetry, speech difficulty, weakness, numbness and headaches.  Psychiatric/Behavioral: Negative.        Objective:   Physical Exam  Constitutional: She is oriented to person, place, and time. She appears well-developed and well-nourished. No distress.  Cardiovascular: Normal rate, regular rhythm, normal heart sounds and intact distal pulses.   Pulmonary/Chest: Effort normal and breath sounds normal.  Neurological: She is alert and oriented to person, place, and time.  Psychiatric: She has a normal mood and affect. Her behavior is normal. Thought content normal.          Assessment & Plan:  We will stop the Latuda and switch to Lamictal 50 mg qhs. She will follow up with Psychiatry as above. She will monitor her glucoses.

## 2014-01-26 NOTE — Progress Notes (Signed)
Pre visit review using our clinic review tool, if applicable. No additional management support is needed unless otherwise documented below in the visit note. 

## 2014-01-29 ENCOUNTER — Ambulatory Visit (HOSPITAL_COMMUNITY): Payer: No Typology Code available for payment source | Admitting: Psychiatry

## 2014-02-13 ENCOUNTER — Encounter: Payer: Self-pay | Admitting: Family

## 2014-02-13 ENCOUNTER — Ambulatory Visit (INDEPENDENT_AMBULATORY_CARE_PROVIDER_SITE_OTHER): Payer: No Typology Code available for payment source | Admitting: Family

## 2014-02-13 VITALS — BP 120/80 | HR 88 | Wt 282.3 lb

## 2014-02-13 DIAGNOSIS — F319 Bipolar disorder, unspecified: Secondary | ICD-10-CM

## 2014-02-13 DIAGNOSIS — F3130 Bipolar disorder, current episode depressed, mild or moderate severity, unspecified: Secondary | ICD-10-CM

## 2014-02-13 NOTE — Progress Notes (Signed)
Pre visit review using our clinic review tool, if applicable. No additional management support is needed unless otherwise documented below in the visit note. 

## 2014-02-13 NOTE — Progress Notes (Signed)
Subjective:    Patient ID: Amber Nielsen, female    DOB: 05/25/1985, 28 y.o.   MRN: [id number]  HPI 28 year old white female, nonsmoker with a history of bipolar depression is in today for recheck. At her last office visit she saw Dr. Clent RidgesFry who started Lamictal 50 mg once daily. She's tolerating the medication well. Mood is more stable and she feels back to normal. Reports have an increased stress over the last few months but coping well. Husband lost his job and the family has moved in with her mother-in-law. Denies any feelings of helplessness, hopelessness no thoughts of death or dying.   Review of Systems  Constitutional: Negative.   Respiratory: Negative.   Cardiovascular: Negative.   Gastrointestinal: Negative.   Endocrine: Negative.   Genitourinary: Negative.   Musculoskeletal: Negative.   Skin: Negative.   Allergic/Immunologic: Negative.   Neurological: Negative.   Hematological: Negative.   Psychiatric/Behavioral: Negative.    Past Medical History  Diagnosis Date  . Asthma     allergy related. rodent animals  . Obesity   . Depression   . Anxiety   . Headache(784.0)   . Gestational diabetes     diet controlled  . Pregnancy induced hypertension   . H/O varicella   . Proteinuria complicating pregnancy     History   Social History  . Marital Status: Married    Spouse Name: N/A    Number of Children: N/A  . Years of Education: N/A   Occupational History  . Not on file.   Social History Main Topics  . Smoking status: Never Smoker   . Smokeless tobacco: Never Used  . Alcohol Use: No  . Drug Use: No  . Sexual Activity: Yes   Other Topics Concern  . Not on file   Social History Narrative    Past Surgical History  Procedure Laterality Date  . Tooth extraction      Family History  Problem Relation Age of Onset  . Heart disease Father   . Heart attack Father   . Nephrolithiasis Father   . Mental retardation Maternal Aunt   . Diabetes Maternal  Grandmother   . Cancer Maternal Grandmother     ovarian and breast  . Diabetes Maternal Grandfather   . Diabetes Paternal Grandmother   . COPD Paternal Grandmother   . Hypertension Paternal Grandmother   . Diabetes Paternal Grandfather     Allergies  Allergen Reactions  . Celery Oil Swelling    Tongue swelling when eating celery    Current Outpatient Prescriptions on File Prior to Visit  Medication Sig Dispense Refill  . albuterol (PROVENTIL HFA;VENTOLIN HFA) 108 (90 BASE) MCG/ACT inhaler Inhale 1 puff into the lungs every 6 (six) hours as needed for wheezing or shortness of breath.    . clonazePAM (KLONOPIN) 0.5 MG tablet Take 1 tablet (0.5 mg total) by mouth 2 (two) times daily as needed for anxiety. 30 tablet 1  . lamoTRIgine (LAMICTAL) 25 MG tablet Take 2 tablets (50 mg total) by mouth daily. 60 tablet 1  . PARAGARD INTRAUTERINE COPPER IUD IUD 1 each by Intrauterine route once.     No current facility-administered medications on file prior to visit.    BP 120/80 mmHg  Pulse 88  Wt 282 lb 4.8 oz (128.05 kg)  LMP 01/14/2014 (Approximate)chart    Objective:   Physical Exam  Constitutional: She is oriented to person, place, and time. She appears well-developed and well-nourished.  Neck: Normal range of  motion. Neck supple. No thyromegaly present.  Cardiovascular: Normal rate, regular rhythm and normal heart sounds.   Pulmonary/Chest: Effort normal and breath sounds normal.  Abdominal: Soft. Bowel sounds are normal.  Musculoskeletal: Normal range of motion.  Neurological: She is alert and oriented to person, place, and time.  Skin: Skin is warm and dry.  Psychiatric: She has a normal mood and affect.          Assessment & Plan:  Amber Nielsen was seen today for follow-up.  Diagnoses and associated orders for this visit:  Bipolar depression    Continue Lamictal 50 mg once daily. See psychiatry as scheduled this month. Call the office with any questions or concern.jjs

## 2014-02-13 NOTE — Patient Instructions (Signed)
Stress and Stress Management Stress is a normal reaction to life events. It is what you feel when life demands more than you are used to or more than you can handle. Some stress can be useful. For example, the stress reaction can help you catch the last bus of the day, study for a test, or meet a deadline at work. But stress that occurs too often or for too long can cause problems. It can affect your emotional health and interfere with relationships and normal daily activities. Too much stress can weaken your immune system and increase your risk for physical illness. If you already have a medical problem, stress can make it worse. CAUSES  All sorts of life events may cause stress. An event that causes stress for one person may not be stressful for another person. Major life events commonly cause stress. These may be positive or negative. Examples include losing your job, moving into a new home, getting married, having a baby, or losing a loved one. Less obvious life events may also cause stress, especially if they occur day after day or in combination. Examples include working long hours, driving in traffic, caring for children, being in debt, or being in a difficult relationship. SIGNS AND SYMPTOMS Stress may cause emotional symptoms including, the following:  Anxiety. This is feeling worried, afraid, on edge, overwhelmed, or out of control.  Anger. This is feeling irritated or impatient.  Depression. This is feeling sad, down, helpless, or guilty.  Difficulty focusing, remembering, or making decisions. Stress may cause physical symptoms, including the following:   Aches and pains. These may affect your head, neck, back, stomach, or other areas of your body.  Tight muscles or clenched jaw.  Low energy or trouble sleeping. Stress may cause unhealthy behaviors, including the following:   Eating to feel better (overeating) or skipping meals.  Sleeping too little, too much, or both.  Working  too much or putting off tasks (procrastination).  Smoking, drinking alcohol, or using drugs to feel better. DIAGNOSIS  Stress is diagnosed through an assessment by your health care provider. Your health care provider will ask questions about your symptoms and any stressful life events.Your health care provider will also ask about your medical history and may order blood tests or other tests. Certain medical conditions and medicine can cause physical symptoms similar to stress. Mental illness can cause emotional symptoms and unhealthy behaviors similar to stress. Your health care provider may refer you to a mental health professional for further evaluation.  TREATMENT  Stress management is the recommended treatment for stress.The goals of stress management are reducing stressful life events and coping with stress in healthy ways.  Techniques for reducing stressful life events include the following:  Stress identification. Self-monitor for stress and identify what causes stress for you. These skills may help you to avoid some stressful events.  Time management. Set your priorities, keep a calendar of events, and learn to say "no." These tools can help you avoid making too many commitments. Techniques for coping with stress include the following:  Rethinking the problem. Try to think realistically about stressful events rather than ignoring them or overreacting. Try to find the positives in a stressful situation rather than focusing on the negatives.  Exercise. Physical exercise can release both physical and emotional tension. The key is to find a form of exercise you enjoy and do it regularly.  Relaxation techniques. These relax the body and mind. Examples include yoga, meditation, tai chi, biofeedback, deep  breathing, progressive muscle relaxation, listening to music, being out in nature, journaling, and other hobbies. Again, the key is to find one or more that you enjoy and can do  regularly.  Healthy lifestyle. Eat a balanced diet, get plenty of sleep, and do not smoke. Avoid using alcohol or drugs to relax.  Strong support network. Spend time with family, friends, or other people you enjoy being around.Express your feelings and talk things over with someone you trust. Counseling or talktherapy with a mental health professional may be helpful if you are having difficulty managing stress on your own. Medicine is typically not recommended for the treatment of stress.Talk to your health care provider if you think you need medicine for symptoms of stress. HOME CARE INSTRUCTIONS  Keep all follow-up visits as directed by your health care provider.  Take all medicines as directed by your health care provider. SEEK MEDICAL CARE IF:  Your symptoms get worse or you start having new symptoms.  You feel overwhelmed by your problems and can no longer manage them on your own. SEEK IMMEDIATE MEDICAL CARE IF:  You feel like hurting yourself or someone else. Document Released: 08/19/2000 Document Revised: 07/10/2013 Document Reviewed: 10/18/2012 ExitCare Patient Information 2015 [employer]. This information is not intended to replace advice given to you by your health care provider. Make sure you discuss any questions you have with your health care provider.  

## 2014-02-20 ENCOUNTER — Encounter: Payer: Self-pay | Admitting: Family

## 2014-02-23 ENCOUNTER — Ambulatory Visit (HOSPITAL_COMMUNITY): Payer: No Typology Code available for payment source | Admitting: Psychiatry

## 2014-02-24 ENCOUNTER — Other Ambulatory Visit: Payer: Self-pay | Admitting: Family

## 2014-02-26 ENCOUNTER — Other Ambulatory Visit: Payer: Self-pay

## 2014-02-26 MED ORDER — DULOXETINE HCL 30 MG PO CPEP: 30.0000 mg | ORAL_CAPSULE | Freq: Every day | ORAL | Status: DC

## 2014-03-26 ENCOUNTER — Ambulatory Visit: Payer: No Typology Code available for payment source | Admitting: Family

## 2014-04-02 ENCOUNTER — Other Ambulatory Visit: Payer: Self-pay | Admitting: Family Medicine

## 2014-07-17 ENCOUNTER — Other Ambulatory Visit: Payer: Self-pay | Admitting: Family

## 2014-07-17 DIAGNOSIS — R1011 Right upper quadrant pain: Secondary | ICD-10-CM

## 2014-08-03 ENCOUNTER — Other Ambulatory Visit: Payer: Self-pay | Admitting: Family

## 2014-08-03 DIAGNOSIS — R1030 Lower abdominal pain, unspecified: Secondary | ICD-10-CM

## 2014-08-07 ENCOUNTER — Ambulatory Visit
Admission: RE | Admit: 2014-08-07 | Discharge: 2014-08-07 | Disposition: A | Discharge status: Home / Self Care | Payer: 59 | Source: Ambulatory Visit | Attending: Family | Admitting: Family

## 2014-08-07 DIAGNOSIS — R1030 Lower abdominal pain, unspecified: Secondary | ICD-10-CM

## 2014-08-10 ENCOUNTER — Ambulatory Visit
Admission: RE | Admit: 2014-08-10 | Discharge: 2014-08-10 | Disposition: A | Discharge status: Home / Self Care | Payer: 59 | Source: Ambulatory Visit | Attending: Family | Admitting: Family

## 2014-08-10 DIAGNOSIS — R1011 Right upper quadrant pain: Secondary | ICD-10-CM

## 2015-04-12 ENCOUNTER — Emergency Department (HOSPITAL_COMMUNITY)
Admission: EM | Admit: 2015-04-12 | Discharge: 2015-04-12 | Disposition: A | Discharge status: Home / Self Care | Payer: BLUE CROSS/BLUE SHIELD | Attending: Emergency Medicine | Admitting: Emergency Medicine

## 2015-04-12 ENCOUNTER — Encounter (HOSPITAL_COMMUNITY): Payer: Self-pay

## 2015-04-12 DIAGNOSIS — F329 Major depressive disorder, single episode, unspecified: Secondary | ICD-10-CM | POA: Diagnosis not present

## 2015-04-12 DIAGNOSIS — E669 Obesity, unspecified: Secondary | ICD-10-CM | POA: Diagnosis not present

## 2015-04-12 DIAGNOSIS — Z3202 Encounter for pregnancy test, result negative: Secondary | ICD-10-CM | POA: Diagnosis not present

## 2015-04-12 DIAGNOSIS — Z79899 Other long term (current) drug therapy: Secondary | ICD-10-CM | POA: Diagnosis not present

## 2015-04-12 DIAGNOSIS — K644 Residual hemorrhoidal skin tags: Secondary | ICD-10-CM | POA: Insufficient documentation

## 2015-04-12 DIAGNOSIS — K921 Melena: Secondary | ICD-10-CM | POA: Diagnosis not present

## 2015-04-12 DIAGNOSIS — Z8719 Personal history of other diseases of the digestive system: Secondary | ICD-10-CM | POA: Diagnosis not present

## 2015-04-12 DIAGNOSIS — J45909 Unspecified asthma, uncomplicated: Secondary | ICD-10-CM | POA: Insufficient documentation

## 2015-04-12 DIAGNOSIS — F419 Anxiety disorder, unspecified: Secondary | ICD-10-CM | POA: Insufficient documentation

## 2015-04-12 DIAGNOSIS — Z8632 Personal history of gestational diabetes: Secondary | ICD-10-CM | POA: Insufficient documentation

## 2015-04-12 LAB — CBC
HCT: 39.4 % (ref 36.0–46.0)
Hemoglobin: 13.2 g/dL (ref 12.0–15.0)
MCH: 30 pg (ref 26.0–34.0)
MCHC: 33.5 g/dL (ref 30.0–36.0)
MCV: 89.5 fL (ref 78.0–100.0)
Platelets: 381 10*3/uL (ref 150–400)
RBC: 4.4 MIL/uL (ref 3.87–5.11)
RDW: 13.2 % (ref 11.5–15.5)
WBC: 8.9 10*3/uL (ref 4.0–10.5)

## 2015-04-12 LAB — LIPASE, BLOOD: Lipase: 30 U/L (ref 11–51)

## 2015-04-12 LAB — COMPREHENSIVE METABOLIC PANEL
ALT: 36 U/L (ref 14–54)
AST: 26 U/L (ref 15–41)
Albumin: 3.5 g/dL (ref 3.5–5.0)
Alkaline Phosphatase: 64 U/L (ref 38–126)
Anion gap: 11 (ref 5–15)
BUN: 15 mg/dL (ref 6–20)
CO2: 25 mmol/L (ref 22–32)
Calcium: 9.3 mg/dL (ref 8.9–10.3)
Chloride: 105 mmol/L (ref 101–111)
Creatinine, Ser: 0.99 mg/dL (ref 0.44–1.00)
GFR calc Af Amer: >60 mL/min (ref >60)
GFR calc non Af Amer: >60 mL/min (ref >60)
Glucose, Bld: 97 mg/dL (ref 65–99)
Potassium: 3.7 mmol/L (ref 3.5–5.1)
Sodium: 141 mmol/L (ref 135–145)
Total Bilirubin: 0.3 mg/dL (ref 0.3–1.2)
Total Protein: 7.5 g/dL (ref 6.5–8.1)

## 2015-04-12 LAB — URINALYSIS, ROUTINE W REFLEX MICROSCOPIC
Bilirubin Urine: NEGATIVE
Glucose, UA: NEGATIVE mg/dL
Ketones, ur: NEGATIVE mg/dL
Leukocytes, UA: NEGATIVE
Nitrite: NEGATIVE
Protein, ur: NEGATIVE mg/dL
Specific Gravity, Urine: 1.024 (ref 1.005–1.030)
pH: 5.5 (ref 5.0–8.0)

## 2015-04-12 LAB — URINE MICROSCOPIC-ADD ON

## 2015-04-12 LAB — POC OCCULT BLOOD, ED: Fecal Occult Bld: POSITIVE — AB

## 2015-04-12 LAB — HCG, QUANTITATIVE, PREGNANCY: hCG, Beta Chain, Quant, S: 1 m[IU]/mL (ref <5)

## 2015-04-12 NOTE — Discharge Instructions (Signed)
If you were given medicines take as directed.  If you are on coumadin or contraceptives realize their levels and effectiveness is altered by many different medicines.  If you have any reaction (rash, tongues swelling, other) to the medicines stop taking and see a physician.    If your blood pressure was elevated in the ER make sure you follow up for management with a primary doctor or return for chest pain, shortness of breath or stroke symptoms.  Please follow up as directed and return to the ER or see a physician for new or worsening symptoms.  Thank you. Filed Vitals:   04/12/15 1422 04/12/15 1633 04/12/15 1634 04/12/15 1639  BP: 124/82 144/93    Pulse: 81  71   Temp: 98 F (36.7 C)   98.4 F (36.9 C)  TempSrc: Oral   Oral  Resp: 18 18    Height:  (1.778 m)     Weight: 300 lb (136.079 kg)     SpO2: 97% 98% 100%     Gastrointestinal Bleeding Gastrointestinal bleeding is bleeding somewhere along the path that food travels through the body (digestive tract). This path is anywhere between the mouth and the opening of the butt (anus). You may have blood in your throw up (vomit) or in your poop (stools). If there is a lot of bleeding, you may need to stay in the hospital. HOME CARE  Only take medicine as told by your doctor.  Eat foods with fiber such as whole grains, fruits, and vegetables. You can also try eating 1 to 3 prunes a day.  Drink enough fluids to keep your pee (urine) clear or pale yellow. GET HELP RIGHT AWAY IF:   Your bleeding gets worse.  You feel dizzy, weak, or you pass out (faint).  You have bad cramps in your back or belly (abdomen).  You have large blood clumps (clots) in your poop.  Your problems are getting worse. MAKE SURE YOU:   Understand these instructions.  Will watch your condition.  Will get help right away if you are not doing well or get worse.   This information is not intended to replace advice given to you by your health care  provider. Make sure you discuss any questions you have with your health care provider.   Document Released: 12/03/2007 Document Revised: 02/10/2012 Document Reviewed: 08/13/2014 Elsevier Interactive Patient Education [employer]

## 2015-04-12 NOTE — ED Notes (Signed)
Pt presents with 2 day h/o bright red blood per rectum.  Pt reports passing clots, having bleeding with and without bowel movements.  Pt reports bilateral lower abdominal pain, reports intermittent nausea.  Pt reports constipation despite watery stools, denies any rectal penetration.

## 2015-04-12 NOTE — ED Provider Notes (Signed)
CSN: [id number]     Arrival date & time 04/12/15  1359 History   First MD Initiated Contact with Patient 04/12/15 1614     No chief complaint on file.    (Consider location/radiation/quality/duration/timing/severity/associated sxs/prior Treatment) HPI Comments: 30 year old female with anxiety, bipolar resents with recurrent blood in the stools for 2 days. Lighter color. Patient had hemorrhoids during pregnancy but no other GI issues. No family history of colon cancer, father had diverticulitis. No fevers or chills. No focal abdominal pain mild nausea with bleeding and small clots.  The history is provided by the patient.    Past Medical History  Diagnosis Date  . Asthma     allergy related. rodent animals  . Obesity   . Depression   . Anxiety   . Headache(784.0)   . Gestational diabetes     diet controlled  . Pregnancy induced hypertension   . H/O varicella   . Proteinuria complicating pregnancy    Past Surgical History  Procedure Laterality Date  . Tooth extraction     Family History  Problem Relation Age of Onset  . Heart disease Father   . Heart attack Father   . Nephrolithiasis Father   . Mental retardation Maternal Aunt   . Diabetes Maternal Grandmother   . Cancer Maternal Grandmother     ovarian and breast  . Diabetes Maternal Grandfather   . Diabetes Paternal Grandmother   . COPD Paternal Grandmother   . Hypertension Paternal Grandmother   . Diabetes Paternal Grandfather    Social History  Substance Use Topics  . Smoking status: Never Smoker   . Smokeless tobacco: Never Used  . Alcohol Use: No   OB History    Gravida Para Term Preterm AB TAB SAB Ectopic Multiple Living   Review of Systems  Constitutional: Negative for fever and chills.  HENT: Negative for congestion.   Eyes: Negative for visual disturbance.  Respiratory: Negative for shortness of breath.   Cardiovascular: Negative for chest pain.  Gastrointestinal: Positive for  blood in stool. Negative for vomiting and abdominal pain.  Genitourinary: Negative for dysuria and flank pain.  Musculoskeletal: Negative for back pain, neck pain and neck stiffness.  Skin: Negative for rash.  Neurological: Negative for light-headedness and headaches.      Allergies  Celery oil  Home Medications   Prior to Admission medications   Medication Sig Start Date End Date Taking? Authorizing Provider  albuterol (PROVENTIL HFA;VENTOLIN HFA) 108 (90 BASE) MCG/ACT inhaler Inhale 1 puff into the lungs every 6 (six) hours as needed for wheezing or shortness of breath.    Historical Provider, MD  clonazePAM (KLONOPIN) 0.5 MG tablet TAKE 1 TABLET BY MOUTH TWICE A DAY AS NEEDED 02/26/14   Eulis Foster, FNP  DULoxetine (CYMBALTA) 30 MG capsule Take 1 capsule (30 mg total) by mouth daily. 02/26/14   Eulis Foster, FNP  lamoTRIgine (LAMICTAL) 25 MG tablet TAKE 2 TABLETS (50 MG TOTAL) BY MOUTH DAILY. 04/03/14   Eulis Foster, FNP  PARAGARD INTRAUTERINE COPPER IUD IUD 1 each by Intrauterine route once.    Historical Provider, MD   BP 144/93 mmHg  Pulse 71  Temp(Src) 98.4 F (36.9 C) (Oral)  Resp 18  Ht  (1.778 m)  Wt 300 lb (136.079 kg)  BMI 43.05 kg/m2  SpO2 100%  LMP 04/06/2015 (Exact Date) Physical Exam  Constitutional: She is oriented to person,  place, and time. She appears well-developed and well-nourished.  HENT:  Head: Normocephalic and atraumatic.  Eyes: Conjunctivae are normal. Right eye exhibits no discharge. Left eye exhibits no discharge.  Neck: Normal range of motion. Neck supple. No tracheal deviation present.  Cardiovascular: Normal rate and regular rhythm.   Pulmonary/Chest: Effort normal and breath sounds normal.  Abdominal: Soft. She exhibits no distension. There is no tenderness. There is no guarding.  Genitourinary:  Patient has small skin tag no fissure, brown stool, normal rectal tone  Musculoskeletal: She exhibits no edema.  Neurological: She is  alert and oriented to person, place, and time.  Skin: Skin is warm. No rash noted.  Psychiatric: She has a normal mood and affect.  Nursing note and vitals reviewed.   ED Course  Procedures (including critical care time) Labs Review Labs Reviewed  URINALYSIS, ROUTINE W REFLEX MICROSCOPIC (NOT AT [facility]) - Abnormal; Notable for the following:    APPearance CLOUDY (*)    Hgb urine dipstick MODERATE (*)    All other components within normal limits  URINE MICROSCOPIC-ADD ON - Abnormal; Notable for the following:    Squamous Epithelial / LPF 0-5 (*)    Bacteria, UA RARE (*)    Casts HYALINE CASTS (*)    All other components within normal limits  LIPASE, BLOOD  COMPREHENSIVE METABOLIC PANEL  CBC  HCG, QUANTITATIVE, PREGNANCY  POC OCCULT BLOOD, ED    Imaging Review No results found. I have personally reviewed and evaluated these images and lab results as part of my medical decision-making.   EKG Interpretation None      MDM   Final diagnoses:  Blood in stool   Well-appearing female presents with intermittent bright colored blood in the stool concern for internal hemorrhoid or diverticular. Blood counts normal, no symptoms on exam, vitals unremarkable, hemoglobin normal. Discussed follow-up with gastroenterology and primary doctor.  If you were given medicines take as directed.  If you are on coumadin or contraceptives realize their levels and effectiveness is altered by many different medicines.  If you have any reaction (rash, tongues swelling, other) to the medicines stop taking and see a physician.    If your blood pressure was elevated in the ER make sure you follow up for management with a primary doctor or return for chest pain, shortness of breath or stroke symptoms.  Please follow up as directed and return to the ER or see a physician for new or worsening symptoms.  Thank you. Filed Vitals:   04/12/15 1422 04/12/15 1633 04/12/15 1634 04/12/15 1639  BP: 124/82 144/93     Pulse: 81  71   Temp: 98 F (36.7 C)   98.4 F (36.9 C)  TempSrc: Oral   Oral  Resp: 18 18    Height:  (1.778 m)     Weight: 300 lb (136.079 kg)     SpO2: 97% 98% 100%       Blane Ohara, MD 04/12/15 1644

## 2015-04-17 ENCOUNTER — Other Ambulatory Visit (INDEPENDENT_AMBULATORY_CARE_PROVIDER_SITE_OTHER): Payer: BLUE CROSS/BLUE SHIELD

## 2015-04-17 ENCOUNTER — Ambulatory Visit (INDEPENDENT_AMBULATORY_CARE_PROVIDER_SITE_OTHER): Payer: BLUE CROSS/BLUE SHIELD | Admitting: Gastroenterology

## 2015-04-17 ENCOUNTER — Telehealth: Payer: Self-pay | Admitting: Gastroenterology

## 2015-04-17 ENCOUNTER — Encounter: Payer: Self-pay | Admitting: Gastroenterology

## 2015-04-17 VITALS — BP 108/78 | HR 84 | Ht 69.5 in | Wt 302.5 lb

## 2015-04-17 DIAGNOSIS — K921 Melena: Secondary | ICD-10-CM

## 2015-04-17 DIAGNOSIS — R197 Diarrhea, unspecified: Secondary | ICD-10-CM

## 2015-04-17 LAB — IGA: IgA: 371 mg/dL (ref 68–378)

## 2015-04-17 MED ORDER — NA SULFATE-K SULFATE-MG SULF 17.5-3.13-1.6 GM/177ML PO SOLN: 1.0000 | Freq: Once | ORAL | Status: DC

## 2015-04-17 NOTE — Telephone Encounter (Signed)
Free sample of Suprep left up front for patient to pick up. Pt notified and will pick up today.

## 2015-04-17 NOTE — Patient Instructions (Signed)
Your physician has requested that you go to the basement for the following lab work before leaving today: TTG, IGA.  You have been scheduled for a colonoscopy. Please follow written instructions given to you at your visit today.  Please pick up your prep supplies at the pharmacy within the next 1-3 days. If you use inhalers (even only as needed), please bring them with you on the day of your procedure. Your physician has requested that you go to www.startemmi.com and enter the access code given to you at your visit today. This web site gives a general overview about your procedure. However, you should still follow specific instructions given to you by our office regarding your preparation for the procedure.  Thank you for choosing me and [facility].  Venita Lick. Pleas Koch., MD., Clementeen Graham

## 2015-04-17 NOTE — Progress Notes (Signed)
    History of Present Illness: This is a 44 old female referred by Eulis Foster, FNP for the evaluation of hematochezia and diarrhea. She relates a history of diarrhea for about 3 years. She generally has 6-10 bowel movements per day, often urgent following meals and at current other times. Over the past year she has had intermittent rectal bleeding with bowel movements recently she had a prolonged episode of several days of bright red rectal bleeding with clots. She was evaluated in the ED on February 3. CBC and CMP were normal. DRE showed Hemoccult positive stool noted. She relates a history of hemorrhoids during her 2 pregnancies. Denies weight loss, abdominal pain, constipation, change in stool caliber, melena, nausea, vomiting, dysphagia, reflux symptoms, chest pain.   Review of Systems: Pertinent positive and negative review of systems were noted in the above HPI section. All other review of systems were otherwise negative.  Current Medications, Allergies, Past Medical History, Past Surgical History, Family History and Social History were reviewed in [employer] record.  Physical Exam: General: Well developed, well nourished, obese, no acute distress Head: Normocephalic and atraumatic Eyes:  sclerae anicteric, EOMI Ears: Normal auditory acuity Mouth: No deformity or lesions Neck: Supple, no masses or thyromegaly Lungs: Clear throughout to auscultation Heart: Regular rate and rhythm; no murmurs, rubs or bruits Abdomen: Soft, non tender and non distended. No masses, hepatosplenomegaly or hernias noted. Normal Bowel sounds Rectal: DRE on 2/3 showed no lesions and heme + stool Musculoskeletal: Symmetrical with no gross deformities  Skin: No lesions on visible extremities Pulses:  Normal pulses noted Extremities: No clubbing, cyanosis, edema or deformities noted Neurological: Alert oriented x 4, grossly nonfocal Cervical Nodes:  No significant cervical  adenopathy Inguinal Nodes: No significant inguinal adenopathy Psychological:  Alert and cooperative. Normal mood and affect  Assessment and Recommendations:  1. Chronic diarrhea and worsening hematochezia. Rule out IBD, celiac disease, hemorrhoids and other disorders. tTG and IgA today. Begin Bentyl 10 mg 3 times a day before meals. Imodium 1 bid as needed. Schedule colonoscopy. The risks (including bleeding, perforation, infection, missed lesions, medication reactions and possible hospitalization or surgery if complications occur), benefits, and alternatives to colonoscopy with possible biopsy and possible polypectomy were discussed with the patient and they consent to proceed.    cc: Eulis Foster, FNP [address] [place], [place] [address]

## 2015-04-19 LAB — TISSUE TRANSGLUTAMINASE, IGA: Tissue Transglutaminase Ab, IgA: 1 U/mL (ref <4)

## 2015-05-15 ENCOUNTER — Encounter: Payer: Self-pay | Admitting: Gastroenterology

## 2015-05-15 ENCOUNTER — Ambulatory Visit (AMBULATORY_SURGERY_CENTER): Payer: BLUE CROSS/BLUE SHIELD | Admitting: Gastroenterology

## 2015-05-15 VITALS — BP 117/69 | HR 72 | Temp 98.6°F | Resp 14 | Ht 69.5 in | Wt 302.0 lb

## 2015-05-15 DIAGNOSIS — R197 Diarrhea, unspecified: Secondary | ICD-10-CM

## 2015-05-15 DIAGNOSIS — K921 Melena: Secondary | ICD-10-CM

## 2015-05-15 MED ORDER — SODIUM CHLORIDE 0.9 % IV SOLN: 500.0000 mL | INTRAVENOUS | Status: DC

## 2015-05-15 MED ORDER — DICYCLOMINE HCL 10 MG PO CAPS: 10.0000 mg | ORAL_CAPSULE | Freq: Three times a day (TID) | ORAL | Status: DC

## 2015-05-15 NOTE — Progress Notes (Signed)
Report to PACU, RN, vss, BBS= Clear.  

## 2015-05-15 NOTE — Op Note (Signed)
[facility] Patient Name: Judah Chevere Procedure Date: 05/15/2015 2:08 PM MRN: [id number] Endoscopist: Meryl Dare , MD Age: 30 Referring MD:  Date of Birth: 1985/04/14 Gender: Female Procedure:            Colonoscopy Indications:          Clinically significant diarrhea of unexplained origin,                        Hematochezia Medicines:            Monitored Anesthesia Care Procedure:            Pre-Anesthesia Assessment:                       - Prior to the procedure, a History and Physical was                        performed, and patient medications and allergies were                        reviewed. The patient's tolerance of previous                        anesthesia was also reviewed. The risks and benefits of                        the procedure and the sedation options and risks were                        discussed with the patient. All questions were                        answered, and informed consent was obtained. Prior                        Anticoagulants: The patient has taken no previous                        anticoagulant or antiplatelet agents. ASA Grade                        Assessment: II - A patient with mild systemic disease.                        After reviewing the risks and benefits, the patient was                        deemed in satisfactory condition to undergo the                        procedure.                       After obtaining informed consent, the colonoscope was                        passed under direct vision. Throughout the procedure,                        the patient's blood pressure, pulse, and oxygen  saturations were monitored continuously. The Model                        [id number]-H190L [id number]) scope was introduced through the                        anus and advanced to the the terminal ileum, with                        identification of the appendiceal orifice and IC valve.   The colonoscopy was performed without difficulty. The                        quality of the bowel preparation was good. The terminal                        ileum, ileocecal valve, appendiceal orifice, and rectum                        were photographed. Scope In: 2:35:45 PM Scope Out: 2:48:14 PM Scope Withdrawal Time: 0 hours 11 minutes 17 seconds  Total Procedure Duration: 0 hours 12 minutes 29 seconds  Findings:      The terminal ileum appeared normal.      Biopsies for histology were taken with a cold forceps from the ascending       colon, transverse colon, descending colon and sigmoid colon for       evaluation of microscopic colitis.      Internal hemorrhoids were found during retroflexion. The hemorrhoids       were small and Grade I (internal hemorrhoids that do not prolapse).      The exam was otherwise without abnormality. Complications:        No immediate complications. Estimated Blood Loss: Estimated blood loss was minimal. Impression:           - The examined portion of the ileum was normal.                       - Internal hemorrhoids.                       - The examination was otherwise normal.                       - Biopsies were taken with a cold forceps from the                        ascending colon, transverse colon, descending colon and                        sigmoid colon for evaluation of microscopic colitis. Recommendation:       - Patient has a contact number available for                        emergencies. The signs and symptoms of potential                        delayed complications were discussed with the patient.                        Return  to normal activities tomorrow. Written discharge                        instructions were provided to the patient.                       - Resume previous diet.                       - Continue present medications.                       - Use Bentyl (dicyclomine) 10 mg PO TID 30 min AC                         indefinitely.                       - Preparation H suppository: Insert rectally BID PRN.                       - Patient has a contact number available for                        emergencies. The signs and symptoms of potential                        delayed complications were discussed with the patient.                        Return to normal activities tomorrow. Written discharge                        instructions were provided to the patient.                       - Repeat colonoscopy at age [age] for screening purposes.                       - Return to GI office in 6 weeks.                       - Await pathology results. Procedure Code(s):    --- Professional ---                       [id number], Colonoscopy, flexible; with biopsy, single or                        multiple CPT copyright 2016 American Medical Association. All rights reserved. Venita LickMalcolm T. Russella DarStark, MD Meryl DareMalcolm T Hendry Speas, MD 05/15/2015 3:03:20 PM This report has been signed electronically. Number of Addenda: 0

## 2015-05-15 NOTE — Patient Instructions (Signed)
Impressions/recommendations:  Hemorrhoids (handout given)  Bentyl three times per day 30 minutes before meals. Preparation H suppository, twice daily as needed.  FODMAP diet (handout given)  Call office for appointment for 6 weeks.  YOU HAD AN ENDOSCOPIC PROCEDURE TODAY AT THE [facility]:   Refer to the procedure report that was given to you for any specific questions about what was found during the examination.  If the procedure report does not answer your questions, please call your gastroenterologist to clarify.  If you requested that your care partner not be given the details of your procedure findings, then the procedure report has been included in a sealed envelope for you to review at your convenience later.  YOU SHOULD EXPECT: Some feelings of bloating in the abdomen. Passage of more gas than usual.  Walking can help get rid of the air that was put into your GI tract during the procedure and reduce the bloating. If you had a lower endoscopy (such as a colonoscopy or flexible sigmoidoscopy) you may notice spotting of blood in your stool or on the toilet paper. If you underwent a bowel prep for your procedure, you may not have a normal bowel movement for a few days.  Please Note:  You might notice some irritation and congestion in your nose or some drainage.  This is from the oxygen used during your procedure.  There is no need for concern and it should clear up in a day or so.  SYMPTOMS TO REPORT IMMEDIATELY:   Following lower endoscopy (colonoscopy or flexible sigmoidoscopy):  Excessive amounts of blood in the stool  Significant tenderness or worsening of abdominal pains  Swelling of the abdomen that is new, acute  Fever of 100F or higher  For urgent or emergent issues, a gastroenterologist can be reached at any hour by calling [phone].   DIET: Your first meal following the procedure should be a small meal and then it is ok to progress to your normal diet.  Heavy or fried foods are harder to digest and may make you feel nauseous or bloated.  Likewise, meals heavy in dairy and vegetables can increase bloating.  Drink plenty of fluids but you should avoid alcoholic beverages for 24 hours.  ACTIVITY:  You should plan to take it easy for the rest of today and you should NOT DRIVE or use heavy machinery until tomorrow (because of the sedation medicines used during the test).    FOLLOW UP: Our staff will call the number listed on your records the next business day following your procedure to check on you and address any questions or concerns that you may have regarding the information given to you following your procedure. If we do not reach you, we will leave a message.  However, if you are feeling well and you are not experiencing any problems, there is no need to return our call.  We will assume that you have returned to your regular daily activities without incident.  If any biopsies were taken you will be contacted by phone or by letter within the next 1-3 weeks.  Please call us at [phone] if you have not heard about the biopsies in 3 weeks.    SIGNATURES/CONFIDENTIALITY: You and/or your care partner have signed paperwork which will be entered into your electronic medical record.  These signatures attest to the fact that that the information above on your After Visit Summary has been reviewed and is understood.  Full responsibility of the confidentiality  of this discharge information lies with you and/or your care-partner.

## 2015-05-15 NOTE — Progress Notes (Signed)
Discharge instructions completed by Janalee DaneNancy Campbell, LPN.

## 2015-05-15 NOTE — Progress Notes (Signed)
Called to room to assist during endoscopic procedure.  Patient ID and intended procedure confirmed with present staff. Received instructions for my participation in the procedure from the performing physician.  

## 2015-05-16 ENCOUNTER — Telehealth: Payer: Self-pay

## 2015-05-16 NOTE — Telephone Encounter (Signed)
Left a message at [phone] for the pt to call us back if any questions or concerns. maw

## 2015-05-27 ENCOUNTER — Encounter: Payer: Self-pay | Admitting: Gastroenterology

## 2015-06-05 ENCOUNTER — Telehealth: Payer: Self-pay | Admitting: Gastroenterology

## 2015-06-05 NOTE — Telephone Encounter (Signed)
Left message for patient to call back  

## 2015-06-06 MED ORDER — DICYCLOMINE HCL 20 MG PO TABS: 20.0000 mg | ORAL_TABLET | Freq: Three times a day (TID) | ORAL | Status: DC

## 2015-06-06 NOTE — Telephone Encounter (Signed)
Patient notified New rx sent  

## 2015-06-06 NOTE — Telephone Encounter (Signed)
Increase Bentyl to 20 mg tid If this is not effective will try another medication

## 2015-06-06 NOTE — Telephone Encounter (Signed)
Patient reports that dicyclomine does not help "with the urges to have a BM.  It may slow down how often I have a BM, but not the urge to have a BM".  Dr. Russella DarStark is there an alternative medication you would like to prescribe?

## 2016-02-01 ENCOUNTER — Encounter (HOSPITAL_COMMUNITY): Payer: Self-pay | Admitting: Emergency Medicine

## 2016-02-01 ENCOUNTER — Emergency Department (HOSPITAL_COMMUNITY)
Admission: EM | Admit: 2016-02-01 | Discharge: 2016-02-02 | Disposition: A | Discharge status: Other Acute Inpatient Hospital | Payer: BLUE CROSS/BLUE SHIELD | Attending: Emergency Medicine | Admitting: Emergency Medicine

## 2016-02-01 DIAGNOSIS — F332 Major depressive disorder, recurrent severe without psychotic features: Secondary | ICD-10-CM | POA: Insufficient documentation

## 2016-02-01 DIAGNOSIS — Z87891 Personal history of nicotine dependence: Secondary | ICD-10-CM | POA: Insufficient documentation

## 2016-02-01 DIAGNOSIS — I10 Essential (primary) hypertension: Secondary | ICD-10-CM | POA: Insufficient documentation

## 2016-02-01 DIAGNOSIS — J45909 Unspecified asthma, uncomplicated: Secondary | ICD-10-CM | POA: Insufficient documentation

## 2016-02-01 DIAGNOSIS — Z79899 Other long term (current) drug therapy: Secondary | ICD-10-CM | POA: Insufficient documentation

## 2016-02-01 HISTORY — DX: Bipolar disorder, unspecified: F31.9

## 2016-02-01 LAB — RAPID URINE DRUG SCREEN, HOSP PERFORMED
Amphetamines: NOT DETECTED
Barbiturates: NOT DETECTED
Benzodiazepines: NOT DETECTED
Cocaine: NOT DETECTED
Opiates: NOT DETECTED
Tetrahydrocannabinol: POSITIVE — AB

## 2016-02-01 LAB — CBC
HCT: 41.9 % (ref 36.0–46.0)
Hemoglobin: 14.5 g/dL (ref 12.0–15.0)
MCH: 30.9 pg (ref 26.0–34.0)
MCHC: 34.6 g/dL (ref 30.0–36.0)
MCV: 89.3 fL (ref 78.0–100.0)
Platelets: 550 10*3/uL — ABNORMAL HIGH (ref 150–400)
RBC: 4.69 MIL/uL (ref 3.87–5.11)
RDW: 13.6 % (ref 11.5–15.5)
WBC: 10.2 10*3/uL (ref 4.0–10.5)

## 2016-02-01 LAB — COMPREHENSIVE METABOLIC PANEL
ALT: 64 U/L — ABNORMAL HIGH (ref 14–54)
AST: 43 U/L — ABNORMAL HIGH (ref 15–41)
Albumin: 4.2 g/dL (ref 3.5–5.0)
Alkaline Phosphatase: 71 U/L (ref 38–126)
Anion gap: 9 (ref 5–15)
BUN: 10 mg/dL (ref 6–20)
CO2: 27 mmol/L (ref 22–32)
Calcium: 9.4 mg/dL (ref 8.9–10.3)
Chloride: 103 mmol/L (ref 101–111)
Creatinine, Ser: 0.71 mg/dL (ref 0.44–1.00)
GFR calc Af Amer: >60 mL/min (ref >60)
GFR calc non Af Amer: >60 mL/min (ref >60)
Glucose, Bld: 113 mg/dL — ABNORMAL HIGH (ref 65–99)
Potassium: 4.2 mmol/L (ref 3.5–5.1)
Sodium: 139 mmol/L (ref 135–145)
Total Bilirubin: 0.7 mg/dL (ref 0.3–1.2)
Total Protein: 8.2 g/dL — ABNORMAL HIGH (ref 6.5–8.1)

## 2016-02-01 LAB — ETHANOL: Alcohol, Ethyl (B): <5 mg/dL (ref <5)

## 2016-02-01 LAB — POC URINE PREG, ED: Preg Test, Ur: NEGATIVE

## 2016-02-01 LAB — ACETAMINOPHEN LEVEL: Acetaminophen (Tylenol), Serum: <10 ug/mL — ABNORMAL LOW (ref 10–30)

## 2016-02-01 LAB — SALICYLATE LEVEL: Salicylate Lvl: <7 mg/dL (ref 2.8–30.0)

## 2016-02-01 NOTE — ED Notes (Signed)
Pt from home with complaints of SI. Pt states that she had thoughts on and off of overdosing on her home medications so she has stopped taking all of her medications (she does not trust herself with them). Pt states she has history of bipolar disorder but recently has been going through a divorce that has exacerbated her symptoms. Pt is calm and cooprative. Pt also has complaints of VH of stick figures walking and waving at her

## 2016-02-01 NOTE — ED Provider Notes (Signed)
[facility]-EMERGENCY DEPT Provider Note   CSN: [id number] Arrival date & time: 02/01/16  2027     History   Chief Complaint Chief Complaint  Patient presents with  . Suicidal  . Hallucinations    HPI Amber Nielsen is a 30 y.o. female.  The history is provided by the patient. No language interpreter was used.   Amber Nielsen is a 30 y.o. female who presents to the Emergency Department complaining of SI.  She has an ongoing history of suicidal thoughts and symptoms have been worsening over the last several weeks with occasional plans to wreck her car. She has visual hallucinations and sees shadows of people doing regular daily activities. She has a history of bipolar disorder and has been previously on medications but is no longer. She comes in voluntarily today to seek treatment. She has a family history of schizophrenia. Symptoms are severe, waxing and waning and worsening. Past Medical History:  Diagnosis Date  . Anxiety   . Asthma    allergy related. rodent animals  . Bipolar 1 disorder (HCC)   . Depression   . Gestational diabetes    diet controlled  . H/O varicella   . Headache(784.0)   . HTN (hypertension)   . Kidney stones   . Obesity   . Pregnancy induced hypertension   . Proteinuria complicating pregnancy     Patient Active Problem List   Diagnosis Date Noted  . Bipolar affective disorder, currently depressed, moderate (HCC) 01/10/2014  . Anxiety disorder 01/10/2014  . Muscle pain 10/12/2013  . Dizziness 07/03/2013  . Chest pain 07/03/2013  . Palpitations 07/03/2013  . Morbid obesity (HCC) 12/15/2012  . Postpartum care following vaginal delivery (8/29) 11/06/2011  . Gestational diabetes mellitus, class A2 11/05/2011  . Mild preeclampsia 11/05/2011    Past Surgical History:  Procedure Laterality Date  . ibs    . TOOTH EXTRACTION      OB History    Gravida Para Term Preterm AB Living   4 2 2   1 2    SAB TAB Ectopic Multiple Live Births   1        2       Home Medications    Prior to Admission medications   Medication Sig Start Date End Date Taking? Authorizing Provider  albuterol (PROVENTIL HFA;VENTOLIN HFA) 108 (90 BASE) MCG/ACT inhaler Inhale 1 puff into the lungs every 6 (six) hours as needed for wheezing or shortness of breath.   Yes Historical Provider, MD  amitriptyline (ELAVIL) 10 MG tablet Take 20 mg by mouth at bedtime.  12/06/15  Yes Historical Provider, MD  clonazePAM (KLONOPIN) 0.5 MG tablet TAKE 1 TABLET BY MOUTH TWICE A DAY AS NEEDED Patient taking differently: TAKE 1 TABLET BY MOUTH TWICE A DAY AS NEEDED for anxiety 02/26/14  Yes Eulis FosterPadonda B Webb, FNP  dicyclomine (BENTYL) 20 MG tablet Take 1 tablet (20 mg total) by mouth 3 (three) times daily before meals. 06/06/15  Yes Meryl DareMalcolm T Stark, MD  ibuprofen (ADVIL,MOTRIN) 200 MG tablet Take 400 mg by mouth every 6 (six) hours as needed for headache.   Yes Historical Provider, MD  PARAGARD INTRAUTERINE COPPER IUD IUD 1 each by Intrauterine route once.   Yes Historical Provider, MD  sertraline (ZOLOFT) 100 MG tablet Take 150 mg by mouth daily.  04/05/15  Yes Historical Provider, MD  Vitamin D, Ergocalciferol, (DRISDOL) 50000 units CAPS capsule Take 50,000 Units by mouth every 7 (seven) days.  01/05/16  Yes Historical  Provider, MD  dicyclomine (BENTYL) 10 MG capsule Take 1 capsule (10 mg total) by mouth 3 (three) times daily. Patient not taking: Reported on 02/01/2016 05/15/15   Meryl Dare, MD    Family History Family History  Problem Relation Age of Onset  . Heart disease Father   . Heart attack Father   . Nephrolithiasis Father   . Colon polyps Father   . Mental retardation Maternal Aunt   . Diabetes Maternal Grandmother   . Cancer Maternal Grandmother     ovarian and breast  . Diabetes Maternal Grandfather   . Diabetes Paternal Grandmother   . Hypertension Paternal Grandmother   . Heart disease Paternal Grandmother   . Colon polyps Paternal Grandmother   .  Diabetes Paternal Grandfather   . Colon polyps Paternal Uncle     Social History Social History  Substance Use Topics  . Smoking status: Former Smoker    Types: Cigarettes    Quit date: 03/09/2005  . Smokeless tobacco: Never Used  . Alcohol use No     Allergies   Celery oil; Cymbalta [duloxetine hcl]; Lamictal [lamotrigine]; and Prozac [fluoxetine hcl]   Review of Systems Review of Systems  All other systems reviewed and are negative.    Physical Exam Updated Vital Signs BP 133/97 (BP Location: Left Arm)   Pulse 97   Temp 98 F (36.7 C) (Oral)   Resp 16   Ht 5\' 10"  (1.778 m)   Wt 287 lb (130.2 kg)   SpO2 97%   BMI 41.18 kg/m   Physical Exam  Constitutional: She is oriented to person, place, and time. She appears well-developed and well-nourished.  HENT:  Head: Normocephalic and atraumatic.  Cardiovascular: Normal rate and regular rhythm.   Pulmonary/Chest: Effort normal. No respiratory distress.  Musculoskeletal: Normal range of motion.  Neurological: She is alert and oriented to person, place, and time.  Skin: Skin is warm.  Psychiatric:  Flat affect  Nursing note and vitals reviewed.    ED Treatments / Results  Labs (all labs ordered are listed, but only abnormal results are displayed) Labs Reviewed  COMPREHENSIVE METABOLIC PANEL - Abnormal; Notable for the following:       Result Value   Glucose, Bld 113 (*)    Total Protein 8.2 (*)    AST 43 (*)    ALT 64 (*)    All other components within normal limits  ACETAMINOPHEN LEVEL - Abnormal; Notable for the following:    Acetaminophen (Tylenol), Serum <10 (*)    All other components within normal limits  CBC - Abnormal; Notable for the following:    Platelets 550 (*)    All other components within normal limits  RAPID URINE DRUG SCREEN, HOSP PERFORMED - Abnormal; Notable for the following:    Tetrahydrocannabinol POSITIVE (*)    All other components within normal limits  ETHANOL  SALICYLATE LEVEL    POC URINE PREG, ED    EKG  EKG Interpretation None       Radiology No results found.  Procedures Procedures (including critical care time)  Medications Ordered in ED Medications - No data to display   Initial Impression / Assessment and Plan / ED Course  I have reviewed the triage vital signs and the nursing notes.  Pertinent labs & imaging results that were available during my care of the patient were reviewed by me and considered in my medical decision making (see chart for details).  Clinical Course   Patient with  history of bipolar disorder here with intermittent suicidal ideation and visual hallucinations. She is calm and appropriate in the emergency department. She has been medically cleared for psychiatric evaluation and treatment.  Final Clinical Impressions(s) / ED Diagnoses   Final diagnoses:  None    New Prescriptions New Prescriptions   No medications on file     Tilden FossaElizabeth Nasrin Lanzo, MD 02/02/16 [phone]

## 2016-02-01 NOTE — ED Notes (Signed)
Bed: WA31 Expected date:  Expected time:  Means of arrival:  Comments: 

## 2016-02-02 ENCOUNTER — Encounter: Payer: Self-pay | Admitting: General Practice

## 2016-02-02 ENCOUNTER — Inpatient Hospital Stay
Admission: AD | Admit: 2016-02-02 | Discharge: 2016-02-05 | DRG: 885 | Disposition: A | Discharge status: Home / Self Care | Payer: BLUE CROSS/BLUE SHIELD | Source: Intra-hospital | Attending: Psychiatry | Admitting: Psychiatry

## 2016-02-02 DIAGNOSIS — F332 Major depressive disorder, recurrent severe without psychotic features: Secondary | ICD-10-CM | POA: Diagnosis present

## 2016-02-02 DIAGNOSIS — F419 Anxiety disorder, unspecified: Secondary | ICD-10-CM | POA: Diagnosis present

## 2016-02-02 DIAGNOSIS — Z888 Allergy status to other drugs, medicaments and biological substances status: Secondary | ICD-10-CM

## 2016-02-02 DIAGNOSIS — I10 Essential (primary) hypertension: Secondary | ICD-10-CM | POA: Diagnosis present

## 2016-02-02 DIAGNOSIS — F17200 Nicotine dependence, unspecified, uncomplicated: Secondary | ICD-10-CM | POA: Diagnosis present

## 2016-02-02 DIAGNOSIS — Z833 Family history of diabetes mellitus: Secondary | ICD-10-CM

## 2016-02-02 DIAGNOSIS — F1229 Cannabis dependence with unspecified cannabis-induced disorder: Secondary | ICD-10-CM | POA: Diagnosis present

## 2016-02-02 DIAGNOSIS — Z975 Presence of (intrauterine) contraceptive device: Secondary | ICD-10-CM | POA: Diagnosis not present

## 2016-02-02 DIAGNOSIS — E559 Vitamin D deficiency, unspecified: Secondary | ICD-10-CM | POA: Diagnosis not present

## 2016-02-02 DIAGNOSIS — Z87891 Personal history of nicotine dependence: Secondary | ICD-10-CM

## 2016-02-02 DIAGNOSIS — Z810 Family history of intellectual disabilities: Secondary | ICD-10-CM | POA: Diagnosis not present

## 2016-02-02 DIAGNOSIS — Z79899 Other long term (current) drug therapy: Secondary | ICD-10-CM | POA: Diagnosis not present

## 2016-02-02 DIAGNOSIS — Z8249 Family history of ischemic heart disease and other diseases of the circulatory system: Secondary | ICD-10-CM

## 2016-02-02 DIAGNOSIS — Z6841 Body Mass Index (BMI) 40.0 and over, adult: Secondary | ICD-10-CM | POA: Diagnosis not present

## 2016-02-02 DIAGNOSIS — F329 Major depressive disorder, single episode, unspecified: Secondary | ICD-10-CM | POA: Diagnosis present

## 2016-02-02 DIAGNOSIS — R45851 Suicidal ideations: Secondary | ICD-10-CM | POA: Diagnosis present

## 2016-02-02 DIAGNOSIS — Z635 Disruption of family by separation and divorce: Secondary | ICD-10-CM

## 2016-02-02 DIAGNOSIS — Z87442 Personal history of urinary calculi: Secondary | ICD-10-CM

## 2016-02-02 DIAGNOSIS — B373 Candidiasis of vulva and vagina: Secondary | ICD-10-CM | POA: Diagnosis not present

## 2016-02-02 DIAGNOSIS — Z8632 Personal history of gestational diabetes: Secondary | ICD-10-CM

## 2016-02-02 DIAGNOSIS — J45909 Unspecified asthma, uncomplicated: Secondary | ICD-10-CM | POA: Diagnosis present

## 2016-02-02 DIAGNOSIS — G4700 Insomnia, unspecified: Secondary | ICD-10-CM | POA: Diagnosis present

## 2016-02-02 DIAGNOSIS — R7303 Prediabetes: Secondary | ICD-10-CM | POA: Diagnosis present

## 2016-02-02 DIAGNOSIS — Z91018 Allergy to other foods: Secondary | ICD-10-CM

## 2016-02-02 DIAGNOSIS — Z818 Family history of other mental and behavioral disorders: Secondary | ICD-10-CM

## 2016-02-02 DIAGNOSIS — Z791 Long term (current) use of non-steroidal anti-inflammatories (NSAID): Secondary | ICD-10-CM

## 2016-02-02 DIAGNOSIS — K589 Irritable bowel syndrome without diarrhea: Secondary | ICD-10-CM | POA: Diagnosis present

## 2016-02-02 DIAGNOSIS — Z8371 Family history of colonic polyps: Secondary | ICD-10-CM | POA: Diagnosis not present

## 2016-02-02 DIAGNOSIS — Z8041 Family history of malignant neoplasm of ovary: Secondary | ICD-10-CM

## 2016-02-02 DIAGNOSIS — Z803 Family history of malignant neoplasm of breast: Secondary | ICD-10-CM

## 2016-02-02 DIAGNOSIS — F1210 Cannabis abuse, uncomplicated: Secondary | ICD-10-CM

## 2016-02-02 MED ORDER — NICOTINE 21 MG/24HR TD PT24
21.0000 mg | MEDICATED_PATCH | Freq: Every day | TRANSDERMAL | Status: DC
  Administered 2016-02-02: 21 mg via TRANSDERMAL
  Filled 2016-02-02: qty 1

## 2016-02-02 MED ORDER — TRAZODONE HCL 50 MG PO TABS
50.0000 mg | ORAL_TABLET | Freq: Every evening | ORAL | Status: DC | PRN
  Administered 2016-02-03: 50 mg via ORAL
  Filled 2016-02-02 (×2): qty 1

## 2016-02-02 MED ORDER — NICOTINE 21 MG/24HR TD PT24
21.0000 mg | MEDICATED_PATCH | Freq: Every day | TRANSDERMAL | Status: DC
  Administered 2016-02-03 – 2016-02-05 (×3): 21 mg via TRANSDERMAL
  Filled 2016-02-02 (×3): qty 1

## 2016-02-02 MED ORDER — INFLUENZA VAC SPLIT QUAD 0.5 ML IM SUSY
0.5000 mL | PREFILLED_SYRINGE | INTRAMUSCULAR | Status: AC
  Administered 2016-02-03: 0.5 mL via INTRAMUSCULAR
  Filled 2016-02-02: qty 0.5

## 2016-02-02 MED ORDER — DICYCLOMINE HCL 20 MG PO TABS
20.0000 mg | ORAL_TABLET | Freq: Three times a day (TID) | ORAL | Status: DC
  Administered 2016-02-02 – 2016-02-05 (×8): 20 mg via ORAL
  Filled 2016-02-02 (×9): qty 1

## 2016-02-02 MED ORDER — PARAGARD INTRAUTERINE COPPER IU IUD: 1.0000 | INTRAUTERINE_SYSTEM | Freq: Once | INTRAUTERINE | Status: DC

## 2016-02-02 MED ORDER — ACETAMINOPHEN 325 MG PO TABS: 650.0000 mg | ORAL_TABLET | Freq: Four times a day (QID) | ORAL | Status: DC | PRN

## 2016-02-02 MED ORDER — ALUM & MAG HYDROXIDE-SIMETH 200-200-20 MG/5ML PO SUSP: 30.0000 mL | ORAL | Status: DC | PRN

## 2016-02-02 MED ORDER — HYDROXYZINE HCL 25 MG PO TABS
25.0000 mg | ORAL_TABLET | Freq: Four times a day (QID) | ORAL | Status: DC | PRN
  Administered 2016-02-03 – 2016-02-04 (×2): 25 mg via ORAL
  Filled 2016-02-02 (×2): qty 1

## 2016-02-02 MED ORDER — VITAMIN D (ERGOCALCIFEROL) 1.25 MG (50000 UNIT) PO CAPS
50000.0000 [IU] | ORAL_CAPSULE | ORAL | Status: DC
  Administered 2016-02-02: 50000 [IU] via ORAL
  Filled 2016-02-02 (×2): qty 1

## 2016-02-02 MED ORDER — MAGNESIUM HYDROXIDE 400 MG/5ML PO SUSP: 30.0000 mL | Freq: Every day | ORAL | Status: DC | PRN

## 2016-02-02 MED ORDER — ALBUTEROL SULFATE HFA 108 (90 BASE) MCG/ACT IN AERS
1.0000 | INHALATION_SPRAY | Freq: Four times a day (QID) | RESPIRATORY_TRACT | Status: DC | PRN
  Filled 2016-02-02: qty 6.7

## 2016-02-02 MED ORDER — CITALOPRAM HYDROBROMIDE 20 MG PO TABS
10.0000 mg | ORAL_TABLET | Freq: Every day | ORAL | Status: DC
  Administered 2016-02-03 – 2016-02-05 (×3): 10 mg via ORAL
  Filled 2016-02-02 (×3): qty 1

## 2016-02-02 MED ORDER — CITALOPRAM HYDROBROMIDE 10 MG PO TABS
10.0000 mg | ORAL_TABLET | Freq: Every day | ORAL | Status: DC
  Administered 2016-02-02: 10 mg via ORAL
  Filled 2016-02-02: qty 1

## 2016-02-02 MED ORDER — IBUPROFEN 400 MG PO TABS
400.0000 mg | ORAL_TABLET | Freq: Four times a day (QID) | ORAL | Status: DC | PRN
  Administered 2016-02-04: 400 mg via ORAL
  Filled 2016-02-02: qty 1

## 2016-02-02 MED ORDER — TRAZODONE HCL 50 MG PO TABS
50.0000 mg | ORAL_TABLET | Freq: Every evening | ORAL | Status: DC | PRN
Start: 2016-02-02 — End: 2016-02-02

## 2016-02-02 MED ORDER — HYDROXYZINE HCL 25 MG PO TABS: 25.0000 mg | ORAL_TABLET | Freq: Four times a day (QID) | ORAL | Status: DC | PRN

## 2016-02-02 NOTE — BH Specialist Note (Addendum)
Admission Note:     Patient was accepted to [facility] by: Dr. Maryruth BunKapur Admission was completed by: Dr. Maryruth BunKapur Nurse reviewed and assigned bed: Maryelizabeth KaufmannGiGi, RN   Bed:319 Call Report: [id number] Nurse in the ED/Charge: Lillia AbedLindsay, RN Attending MD: Dr. Ardyth HarpsHernandez Admission Secretary: Clydie BraunKaren was called but she is currently at lunch and call back once she returns.    [facility] to [facility] RN the patient can be transferred at this time and this was communicated to all of the above documented staff members.      Maryelizabeth Rowanressa Ercil Cassis, MSW, LCSW, LCAS. CCSI [facility] Triage Specialist [phone]

## 2016-02-02 NOTE — H&P (Signed)
Psychiatric Admission Assessment Adult  Patient Identification: Amber Nielsen MRN:  [id number] Date of Evaluation:  02/02/2016 Chief Complaint:  major depression Principal Diagnosis: Major Deperssion  Diagnosis:   Patient Active Problem List   Diagnosis Date Noted  . Major depressive disorder [F32.9] 02/02/2016    Priority: High  . Cannabis abuse [F12.10] 02/02/2016    Priority: High  . Major depressive disorder, recurrent severe without psychotic features ([place]) [F33.2] 02/02/2016  . Anxiety disorder [F41.9] 01/10/2014  . Muscle pain [M79.1] 10/12/2013  . Dizziness [R42] 07/03/2013  . Chest pain [R07.9] 07/03/2013  . Palpitations [R00.2] 07/03/2013  . Morbid obesity ([place]) [E66.01] 12/15/2012  . Postpartum care following vaginal delivery (8/29) [Z39.2] 11/06/2011  . Gestational diabetes mellitus, class A2 [O24.419] 11/05/2011  . Mild preeclampsia [O14.00] 11/05/2011    History of Present Illness:  Ms. Amber Nielsen is a 30 year old currently separated Caucasian female who was transferred from the [place] emergency room to [facility] inpatient psychiatry for inpatient psychiatric hospitalization after endorsing suicidal thoughts. The patient told her friend she did not trust herself to be alone and was having constructive thoughts. She says she has been having suicidal thoughts on and off throughout most of her life that in the past one year they have become more intense and in the past one week, she started to have more feelings of hopelessness. She is expressing some anxiety that she may be in trouble at her job because her depressive symptoms have prevented her from not performing to the best of her abilities. She does endorse frequent crying spells, low energy level, anhedonia and difficulty with insomnia. She says she has difficulty with onset of sleep and staying asleep and has been resistant to taking any medications for sleep because she wants to be awake for  her children if they wake up at night. She denies any history of any prior suicide attempts in the past. She has not ever seen a psychiatrist in the past but has been getting antidepressants and even mood stabilizers from her PCP. She denies any history of any psychotic symptoms including auditory or visual hallucinations. No paranoid thoughts or delusions. She says she was once diagnosed with bipolar and has tried stabilizers including Lamictal and Depakote. The patient says she could not tolerate either medication. She does endorse some traits consistent with borderline personality including chronic feelings of emptiness, low self-esteem and feelings of low self-worth. She also describes herself as "a people pleaser". She and her husband are separating but are still living in the same household raising 2 children, age 20 and 18. The patient does smoke marijuana on a daily basis since her mid teens but denies any other illicit drug use. She denies any heavy alcohol use and says she only rarely drinks alcohol socially. She has been on Zoloft or years and amitriptyline over the past few months.   Past psychiatric history: The patient reports having struggled with intermittent passive suicidal thoughts throughout most of her life. She has been on Zoloft for the past 4 years prescribed to her by her PCP but is also tried multiple mood stabilizers including Lamictal and Depakote in the past. She also has had trials of Cymbalta and Prozac but says she could not tolerate either medication. She denies any prior suicide attempts. She is not currently seeing an outpatient psychiatrist. No history of any substance abuse treatment.  Substance abuse history: The patient has been using marijuana daily since her mid teens. She denies any cocaine,  opiate, or stimulant use. She says that one of her prior doctors got addicted to Xanax in the past but she denies any current benzo use. She denies any history of any heavy alcohol  use and says she only rarely drinks alcohol socially.  Family psychiatric history: The patient reports that her mother struggles with schizophrenia, borderline personality disorder and PTSD   Social history: The patient was born and raised in the area but says her parents were divorced at an early age. She says her mom left when she was 66 years old. She describes her father as being solid. 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 She has been married for 7 years but separated from her husband for the past one month. She has 2 children, age 6 and 48. Currently her children are under the care of her husband.  Legal history: The patient denies any history of any prior arrest or incarcerations.  Associated Signs/Symptoms: Depression Symptoms:  depressed mood, anhedonia, insomnia, fatigue, feelings of worthlessness/guilt, difficulty concentrating, hopelessness, recurrent thoughts of death, suicidal thoughts without plan, anxiety, loss of energy/fatigue, (Hypo) Manic Symptoms:  She denies any manic symptoms but was given a diagnosis of Bipolar in the past Anxiety Symptoms:  Excessive Worry, Psychotic Symptoms:  None PTSD Symptoms: Negative Total Time spent with patient: 45 minutes   Is the patient at risk to self? Yes.    Has the patient been a risk to self in the past 6 months? Yes.    Has the patient been a risk to self within the distant past? Yes.    Is the patient a risk to others? No.  Has the patient been a risk to others in the past 6 months? No.  Has the patient been a risk to others within the distant past? No.   Prior Inpatient Therapy:  None Prior Outpatient Therapy:  Yes from PCP  Alcohol Screening:   Substance Abuse History in the last 12 months:  Yes.   Consequences of Substance Abuse: Negative Previous Psychotropic Medications: Yes  Psychological Evaluations: Yes  Past Medical History:   Past Medical History:  Diagnosis Date  . Anxiety   . Asthma    allergy related. rodent animals  . Bipolar 1 disorder ([place])   . Depression   . Gestational diabetes    diet controlled  . H/O varicella   . Headache(784.0)   . HTN (hypertension)   . Kidney stones   . Obesity   . Pregnancy induced hypertension   . Proteinuria complicating pregnancy     Past Surgical History:  Procedure Laterality Date  . ibs    . TOOTH EXTRACTION     Family History:  Family History  Problem Relation Age of Onset  . Heart disease Father   . Heart attack Father   . Nephrolithiasis Father   . Colon polyps Father   . Mental retardation Maternal Aunt   . Diabetes Maternal Grandmother   . Cancer Maternal Grandmother     ovarian and breast  . Diabetes Maternal Grandfather   . Diabetes Paternal Grandmother   . Hypertension Paternal Grandmother   . Heart disease Paternal Grandmother   . Colon polyps Paternal Grandmother   . Diabetes Paternal Grandfather   . Colon polyps Paternal Uncle     Tobacco Screening:   Social History:  History  Alcohol Use No     History  Drug Use No  Additional Social History:     Allergies:   Allergies  Allergen Reactions  . Celery Oil Swelling    Tongue swelling when eating celery  . Cymbalta [Duloxetine Hcl] Other (See Comments)    "out of body"  . Lamictal [Lamotrigine] Other (See Comments)    unknown  . Prozac [Fluoxetine Hcl] Other (See Comments)    Anger problems   Lab Results:  Results for orders placed or performed during the hospital encounter of 02/01/16 (from the past 48 hour(s))  Comprehensive metabolic panel     Status: Abnormal   Collection Time: 02/01/16  8:53 PM  Result Value Ref Range   Sodium 139 135 - 145 mmol/L   Potassium 4.2 3.5 - 5.1 mmol/L   Chloride 103 101 - 111 mmol/L   CO2 27 22 - 32 mmol/L   Glucose, Bld 113 (H) 65 - 99 mg/dL   BUN 10 6 - 20 mg/dL   Creatinine, Ser 0.71 0.44 - 1.00 mg/dL   Calcium 9.4 8.9 - 10.3  mg/dL   Total Protein 8.2 (H) 6.5 - 8.1 g/dL   Albumin 4.2 3.5 - 5.0 g/dL   AST 43 (H) 15 - 41 U/L   ALT 64 (H) 14 - 54 U/L   Alkaline Phosphatase 71 38 - 126 U/L   Total Bilirubin 0.7 0.3 - 1.2 mg/dL   GFR calc non Af Amer >60 >60 mL/min   GFR calc Af Amer >60 >60 mL/min    Comment: (NOTE) The eGFR has been calculated using the CKD EPI equation. This calculation has not been validated in all clinical situations. eGFR's persistently <60 mL/min signify possible Chronic Kidney Disease.    Anion gap 9 5 - 15  Ethanol     Status: None   Collection Time: 02/01/16  8:53 PM  Result Value Ref Range   Alcohol, Ethyl (B) <5 <5 mg/dL    Comment:        LOWEST DETECTABLE LIMIT FOR SERUM ALCOHOL IS 5 mg/dL FOR MEDICAL PURPOSES ONLY   Salicylate level     Status: None   Collection Time: 02/01/16  8:53 PM  Result Value Ref Range   Salicylate Lvl <4.2 2.8 - 30.0 mg/dL  Acetaminophen level     Status: Abnormal   Collection Time: 02/01/16  8:53 PM  Result Value Ref Range   Acetaminophen (Tylenol), Serum <10 (L) 10 - 30 ug/mL    Comment:        THERAPEUTIC CONCENTRATIONS VARY SIGNIFICANTLY. A RANGE OF 10-30 ug/mL MAY BE AN EFFECTIVE CONCENTRATION FOR MANY PATIENTS. HOWEVER, SOME ARE BEST TREATED AT CONCENTRATIONS OUTSIDE THIS RANGE. ACETAMINOPHEN CONCENTRATIONS >150 ug/mL AT 4 HOURS AFTER INGESTION AND >50 ug/mL AT 12 HOURS AFTER INGESTION ARE OFTEN ASSOCIATED WITH TOXIC REACTIONS.   cbc     Status: Abnormal   Collection Time: 02/01/16  8:53 PM  Result Value Ref Range   WBC 10.2 4.0 - 10.5 K/uL   RBC 4.69 3.87 - 5.11 MIL/uL   Hemoglobin 14.5 12.0 - 15.0 g/dL   HCT 41.9 36.0 - 46.0 %   MCV 89.3 78.0 - 100.0 fL   MCH 30.9 26.0 - 34.0 pg   MCHC 34.6 30.0 - 36.0 g/dL   RDW 13.6 11.5 - 15.5 %   Platelets 550 (H) 150 - 400 K/uL  Rapid urine drug screen (hospital performed)     Status: Abnormal   Collection Time: 02/01/16  8:59 PM  Result Value Ref Range   Opiates NONE DETECTED  NONE DETECTED   Cocaine NONE DETECTED NONE DETECTED   Benzodiazepines NONE DETECTED NONE DETECTED   Amphetamines NONE DETECTED NONE DETECTED   Tetrahydrocannabinol POSITIVE (A) NONE DETECTED   Barbiturates NONE DETECTED NONE DETECTED    Comment:        DRUG SCREEN FOR MEDICAL PURPOSES ONLY.  IF CONFIRMATION IS NEEDED FOR ANY PURPOSE, NOTIFY LAB WITHIN 5 DAYS.        LOWEST DETECTABLE LIMITS FOR URINE DRUG SCREEN Drug Class       Cutoff (ng/mL) Amphetamine      1000 Barbiturate      200 Benzodiazepine   161 Tricyclics       096 Opiates          300 Cocaine          300 THC              50   POC urine preg, ED     Status: None   Collection Time: 02/01/16  9:02 PM  Result Value Ref Range   Preg Test, Ur NEGATIVE NEGATIVE    Comment:        THE SENSITIVITY OF THIS METHODOLOGY IS >24 mIU/mL     Blood Alcohol level:  Lab Results  Component Value Date   ETH <5 04/54/0981    Metabolic Disorder Labs:  No results found for: HGBA1C, MPG No results found for: PROLACTIN Lab Results  Component Value Date   CHOL 171 07/03/2013   TRIG 162 (H) 07/03/2013   HDL 45 07/03/2013   LDLCALC 94 07/03/2013    Current Medications: Current Facility-Administered Medications  Medication Dose Route Frequency Provider Last Rate Last Dose  . acetaminophen (TYLENOL) tablet 650 mg  650 mg Oral Q6H PRN Chauncey Mann, MD      . albuterol (PROVENTIL HFA;VENTOLIN HFA) 108 (90 Base) MCG/ACT inhaler 1 puff  1 puff Inhalation Q6H PRN Chauncey Mann, MD      . alum & mag hydroxide-simeth (MAALOX/MYLANTA) 200-200-20 MG/5ML suspension 30 mL  30 mL Oral Q4H PRN Chauncey Mann, MD      . Derrill Memo ON 02/03/2016] citalopram (CELEXA) tablet 10 mg  10 mg Oral Daily Chauncey Mann, MD      . dicyclomine (BENTYL) tablet 20 mg  20 mg Oral TID AC Chauncey Mann, MD      . hydrOXYzine (ATARAX/VISTARIL) tablet 25 mg  25 mg Oral Q6H PRN Chauncey Mann, MD      . ibuprofen (ADVIL,MOTRIN) tablet 400 mg  400 mg Oral Q6H PRN  Chauncey Mann, MD      . magnesium hydroxide (MILK OF MAGNESIA) suspension 30 mL  30 mL Oral Daily PRN Chauncey Mann, MD      . Derrill Memo ON 02/03/2016] nicotine (NICODERM CQ - dosed in mg/24 hours) patch 21 mg  21 mg Transdermal Daily Chauncey Mann, MD      . traZODone (DESYREL) tablet 50 mg  50 mg Oral QHS PRN Chauncey Mann, MD      . Vitamin D (Ergocalciferol) (DRISDOL) capsule 50,000 Units  50,000 Units Oral Q7 days Chauncey Mann, MD       PTA Medications: Prescriptions Prior to Admission  Medication Sig Dispense Refill Last Dose  . albuterol (PROVENTIL HFA;VENTOLIN HFA) 108 (90 BASE) MCG/ACT inhaler Inhale 1 puff into the lungs every 6 (six) hours as needed for wheezing or shortness of breath.   Past Month at Unknown time  . amitriptyline (ELAVIL)  10 MG tablet Take 20 mg by mouth at bedtime.   5 2 weeks  . clonazePAM (KLONOPIN) 0.5 MG tablet TAKE 1 TABLET BY MOUTH TWICE A DAY AS NEEDED (Patient taking differently: TAKE 1 TABLET BY MOUTH TWICE A DAY AS NEEDED for anxiety) 30 tablet 1 2 weeks  . dicyclomine (BENTYL) 10 MG capsule Take 1 capsule (10 mg total) by mouth 3 (three) times daily. (Patient not taking: Reported on 02/01/2016) 90 capsule 3 Not Taking at Unknown time  . dicyclomine (BENTYL) 20 MG tablet Take 1 tablet (20 mg total) by mouth 3 (three) times daily before meals. 90 tablet 11 Past Month at Unknown time  . ibuprofen (ADVIL,MOTRIN) 200 MG tablet Take 400 mg by mouth every 6 (six) hours as needed for headache.   01/31/2016 at Unknown time  . PARAGARD INTRAUTERINE COPPER IUD IUD 1 each by Intrauterine route once.   02/01/2016 at Unknown time  . sertraline (ZOLOFT) 100 MG tablet Take 150 mg by mouth daily.    2 weeks  . Vitamin D, Ergocalciferol, (DRISDOL) 50000 units CAPS capsule Take 50,000 Units by mouth every 7 (seven) days.   0 unknown    Musculoskeletal: Strength & Muscle Tone: within normal limits Gait & Station: normal Patient leans: N/A  Psychiatric Specialty  Exam: Physical Exam  Constitutional: She is oriented to person, place, and time. She appears well-developed and well-nourished. No distress.  Obese  HENT:  Head: Normocephalic and atraumatic.  Right Ear: External ear normal.  Left Ear: External ear normal.  Mouth/Throat: No oropharyngeal exudate.  She wears glasses  Eyes: EOM are normal. Pupils are equal, round, and reactive to light. Right eye exhibits no discharge. Left eye exhibits discharge. No scleral icterus.  Neck: Normal range of motion. Neck supple. No thyromegaly present.  Cardiovascular: Normal rate, regular rhythm and normal heart sounds.   Respiratory: Effort normal and breath sounds normal. No respiratory distress. She has no wheezes. She has no rales. She exhibits no tenderness.  GI: Soft. Bowel sounds are normal. She exhibits no distension and no mass. There is no tenderness. There is no rebound and no guarding.  Musculoskeletal: Normal range of motion. She exhibits no edema, tenderness or deformity.  Lymphadenopathy:    She has no cervical adenopathy.  Neurological: She is alert and oriented to person, place, and time. She has normal reflexes. She displays normal reflexes. No cranial nerve deficit. She exhibits normal muscle tone. Coordination normal.  Skin: Skin is warm and dry. No rash noted. She is not diaphoretic. No erythema. No pallor.  Tattoo on right forearm    Review of Systems  Constitutional: Negative.   HENT: Negative.  Negative for congestion, ear discharge, ear pain, hearing loss, nosebleeds, sore throat and tinnitus.   Eyes: Negative.  Negative for blurred vision, double vision, photophobia, pain and discharge.  Respiratory: Negative.  Negative for cough, hemoptysis, sputum production, shortness of breath and wheezing.   Cardiovascular: Negative.  Negative for chest pain, palpitations, orthopnea and claudication.  Gastrointestinal: Negative.  Negative for abdominal pain, blood in stool, constipation,  diarrhea, heartburn, nausea and vomiting.  Genitourinary: Negative.  Negative for dysuria, frequency and urgency.  Musculoskeletal: Positive for back pain, joint pain, myalgias and neck pain. Negative for falls.       She complains of generalized muscle aches and pain "everywhere". She feels that the muscle aches and pain are secondary to her obesity.  Skin: Negative.  Negative for itching and rash.  Neurological: Negative.  Negative  for dizziness, tingling, tremors, sensory change, speech change, focal weakness and headaches.  Endo/Heme/Allergies: Negative.  Negative for environmental allergies. Does not bruise/bleed easily.    There were no vitals taken for this visit.There is no height or weight on file to calculate BMI.  General Appearance: Casual  Eye Contact:  Good  Speech:  Clear and Coherent and Normal Rate  Volume:  Normal  Mood:  Depressed  Affect:  Depressed  Thought Process:  Coherent, Goal Directed and Linear  Orientation:  Full (Time, Place, and Person)  Thought Content:  Logical  Suicidal Thoughts:  Yes.  without intent/plan  Homicidal Thoughts:  No  Memory:  Immediate;   Good Recent;   Good Remote;   Good  Judgement:  Good  Insight:  Good  Psychomotor Activity:  Normal  Concentration:  Concentration: Good and Attention Span: Good  Recall:  Good  Fund of Knowledge:  Good  Language:  Good  Akathisia:  No  Handed:  Right  AIMS (if indicated):     Assets:  Communication Skills Desire for Improvement Financial Resources/Insurance Housing Physical Health Social Support Transportation Vocational/Educational  ADL's:  Intact  Cognition:  WNL  Sleep:       Treatment Plan Summary:   Mood Disorder, NOS R/O Bipolar Disorder R/O Borderline Personality Disorder Cannabis use disorder, severe Obesity Prediabetes Moderate: marital problems   Ms. Deyette is a 30 year old currently separated Caucasian female with a history of a mood disorder, probable bipolar  disorder versus Borderline Personality disorder who was transferred from [facility] emergency room for inpatient psychiatry hospitalization for medication management, safety and stabilization after endorsing suicidal thoughts with no specific plan in the context of separating from her husband recently.  Mood disorder, NOS, rule out bipolar disorder, rule out borderline personality disorder: It was recommended in the [facility] emergency room that she start Celexa 10 mg by mouth daily for anxiety and depressive symptoms and trazodone 50 mg by mouth nightly for insomnia. The patient has had a history of being on mood stabilizers in the past including Depakote and Lamictal which she cannot tolerate. If she does have borderline personality traits, she would benefit more from individual therapy. Also consider Tegretol, Geodon or Latuda for mood stabilization.  Cannabis use disorder, severe: The patient does smoke marijuana on a daily basis. She was advised to abstain from marijuana and all illicit drugs as they may worsen mood symptoms.  Obesity and elevated BMI: The patient was encouraged exercise and regular basis for at least 30 minutes, 3-5 times a week and to monitor her diet low in carbohydrates and fat.  Nicotine use disorder: Will offer an NicoDerm patch  Disposition: The patient has a stable living situation in [place]. She will need psychotropic medication management follow-up appointment with the psychiatrist that she has been getting medications from her PCP.       Daily contact with patient to assess and evaluate symptoms and progress in treatment and Medication management  Observation Level/Precautions:  15 minute checks                 Physician Treatment Plan for Primary Diagnosis: Major Depression vs. Bipolar Disorder Long Term Goal(s): Improvement in symptoms so as ready for discharge  Short Term Goals: Ability to identify changes in lifestyle to reduce recurrence of  condition will improve, Ability to verbalize feelings will improve, Ability to disclose and discuss suicidal ideas, Ability to demonstrate self-control will improve, Ability to identify and develop effective coping behaviors will  improve and Compliance with prescribed medications will improve  Physician Treatment Plan for Secondary Diagnosis: Active Problems:   Major depressive disorder   Cannabis abuse  Long Term Goal(s): Improvement in symptoms so as ready for discharge  Short Term Goals: Abstain from Marijuana  I certify that inpatient services furnished can reasonably be expected to improve the patient's condition.    Jay Schlichter, MD 11/26/20173:58 PM

## 2016-02-02 NOTE — [facility] Suicide Risk Assessment (Signed)
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 She has 2 children  Current Mental Status:   See H+P  Loss Factors:   marital problems and separation  Historical Factors:   Prior depression, mother with mental illness Risk Reduction Factors:   individual therapy, abstinence from cannabis  Total Time spent with patient: 45 minutes Principal Problem: Major Depression, Severe, Recurrent  Diagnosis:   Patient Active Problem List   Diagnosis Date Noted  . Major depressive disorder [F32.9] 02/02/2016    Priority: High  . Cannabis abuse [F12.10] 02/02/2016    Priority: High  . Major depressive disorder, recurrent severe without psychotic features (HCC) [F33.2] 02/02/2016  . Anxiety disorder [F41.9] 01/10/2014  . Muscle pain [M79.1] 10/12/2013  . Dizziness [R42] 07/03/2013  . Chest pain [R07.9] 07/03/2013  . Palpitations [R00.2] 07/03/2013  . Morbid obesity (HCC) [E66.01] 12/15/2012  . Postpartum care following vaginal delivery (8/29) [Z39.2] 11/06/2011  . Gestational diabetes mellitus, class A2 [O24.419] 11/05/2011  . Mild preeclampsia [O14.00] 11/05/2011   Subjective Data:   Amber Nielsen is a 30 year old currently separated Caucasian female who [facility] inpatient psychiatry for inpatient psychiatric hospitalization after endorsing suicidal thoughts. The patient told her friend she did not trust herself to be alone and was having constructive thoughts. She says she has been having suicidal thoughts on and off throughout most of her life that in the past one year they have become more intense and in the past one week, she started to have more feelings of hopelessness. She is expressing some anxiety that she may be in trouble at her job because her depressive symptoms have  prevented her from not performing to the best of her abilities. She does endorse frequent crying spells, low energy level, anhedonia and difficulty with insomnia. She says she has difficulty with onset of sleep and staying asleep and has been resistant to taking any medications for sleep because she wants to be awake for her children if they wake up at night. She denies any history of any prior suicide attempts in the past. She has not ever seen a psychiatrist in the past but has been getting antidepressants and even mood stabilizers from her PCP. She denies any history of any psychotic symptoms including auditory or visual hallucinations. No paranoid thoughts or delusions. She says she was once diagnosed with bipolar and has tried stabilizers including Lamictal and Depakote. The patient says she could not tolerate either medication. She does endorse some traits consistent with borderline personality including chronic feelings of emptiness, low self-esteem and feelings of low self-worth. She also describes herself as "a people pleaser". She and her husband are separating but are still living in the same household raising 2 children, age 28 and 706. The patient does smoke marijuana on a daily basis since her mid teens but denies any other illicit drug use. She denies any heavy alcohol use and says she only rarely drinks alcohol socially. She has been on Zoloft or years and amitriptyline over the past few months.   Past psychiatric history:  The patient reports having struggled with intermittent passive suicidal thoughts throughout most of her life. She has been on Zoloft for the past 4 years prescribed to her by her PCP but is also tried multiple mood stabilizers including Lamictal  and Depakote in the past. She also has had trials of Cymbalta and Prozac but says she could not tolerate either medication. She denies any prior suicide attempts. She is not currently seeing an outpatient psychiatrist. No history of any  substance abuse treatment.  Substance abuse history:  The patient has been using marijuana daily since her mid teens. She denies any cocaine, opiate, or stimulant use. She says that one of her prior doctors got addicted to Xanax in the past but she denies any current benzo use. She denies any history of any heavy alcohol use and says she only rarely drinks alcohol socially.  Family psychiatric history: The patient reports that her mother struggles with schizophrenia, borderline personality disorder and PTSD   Social history:  The patient was born and raised in the area but says her parents were divorced at an early age. She says her mom left when she was 16 years old. She describes her father as being solid. 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 She has been married for 7 years but separated from her husband for the past one month. She has 2 children, age 40 and 39. Currently her children are under the care of her husband.  Legal history:  The patient denies any history of any prior arrest or incarcerations.  Continued Clinical Symptoms:    The "Alcohol Use Disorders Identification Test", Guidelines for Use in Primary Care, Second Edition.  World Science writer [facility]). Score between 0-7:  no or low risk or alcohol related problems. Score between 8-15:  moderate risk of alcohol related problems. Score between 16-19:  high risk of alcohol related problems. Score 20 or above:  warrants further diagnostic evaluation for alcohol dependence and treatment.   CLINICAL FACTORS:   Severe Anxiety and/or Agitation Depression:   Anhedonia Comorbid alcohol abuse/dependence Hopelessness Insomnia Severe Alcohol/Substance Abuse/Dependencies   Musculoskeletal: Strength & Muscle Tone: within normal limits Gait & Station: normal Patient leans: N/A  Psychiatric Specialty Exam: Physical Exam  SEE H&P  ROS: SEE H&P   There were no vitals taken for this visit.There is no height or weight on file to calculate BMI.   MSE: SEE H&P                                                        COGNITIVE FEATURES THAT CONTRIBUTE TO RISK:  None    SUICIDE RISK:   Mild:  Suicidal ideation of limited frequency, intensity, duration, and specificity.  There are no identifiable plans, no associated intent, mild dysphoria and related symptoms, good self-control (both objective and subjective assessment), few other risk factors, and identifiable protective factors, including available and accessible social support.   PLAN OF CARE:   Mood Disorder, NOS R/O Bipolar Disorder R/O Borderline Personality Disorder Cannabis use disorder, severe Obesity Prediabetes Moderate: marital problems   Amber Nielsen is a 30 year old currently separated Caucasian female with a history of a mood disorder, probable bipolar disorder versus Borderline Personality disorder who was transferred from [facility] emergency room for inpatient psychiatry hospitalization for medication management, safety and stabilization after endorsing suicidal thoughts with no specific plan in the context of separating from her husband recently.  Mood disorder, NOS, rule out bipolar disorder, rule out borderline personality  disorder: It was recommended in the [facility] room that she start Celexa 10 mg by mouth daily for anxiety and depressive symptoms and trazodone 50 mg by mouth nightly for insomnia. The patient has had a history of being on mood stabilizers in the past including Depakote and Lamictal which she cannot tolerate. If she does have borderline personality traits, she would benefit more from individual therapy. Also consider Tegretol, Geodon or Latuda for mood stabilization.  Cannabis use disorder, severe: The patient does smoke marijuana on a daily basis. She was advised to abstain from marijuana and all illicit drugs  as they may worsen mood symptoms.  Obesity and elevated BMI: The patient was encouraged exercise and regular basis for at least 30 minutes, 3-5 times a week and to monitor her diet low in carbohydrates and fat.  Nicotine use disorder: Will offer an NicoDerm patch  Disposition: The patient has a stable living situation in [place]. She will need psychotropic medication management follow-up appointment with the psychiatrist that she has been getting medications from her PCP.  She does not own any guns herself.   I certify that inpatient services furnished can reasonably be expected to improve the patient's condition.  Levora AngelKAPUR,Atticus Lemberger KAMAL, MD 02/02/2016, 4:09 PM

## 2016-02-02 NOTE — Progress Notes (Signed)
Patient has been [facility] to [facility].  Patient assigned to room 311 Accepting physician is Dr. Maryruth BunKapur.  Attending is Dr. Ardyth HarpsHernandez Call report to [phone]  Amber Nielsen, LCAS-A, LPC-A, [facility]  Counselor 02/02/2016 11:43 AM

## 2016-02-02 NOTE — Progress Notes (Signed)
Patient is cooperative with admission.Dnies any active suicidal thoughts or homicidal ideations & AV hallucinations.Skin assessment & body search done.No contraband found.Oriented to unit.

## 2016-02-02 NOTE — Consult Note (Signed)
[facility] Face-to-Face Psychiatry Consult   Reason for Consult:  Suicidal ideations Referring Physician:  EDP Patient Identification: Amber Nielsen MRN:  [id number] Principal Diagnosis: Major depressive disorder, recurrent severe without psychotic features [facility]) Diagnosis:   Patient Active Problem List   Diagnosis Date Noted  . Major depressive disorder, recurrent severe without psychotic features ([place]) [F33.2] 02/02/2016    Priority: High  . Anxiety disorder [F41.9] 01/10/2014  . Muscle pain [M79.1] 10/12/2013  . Dizziness [R42] 07/03/2013  . Chest pain [R07.9] 07/03/2013  . Palpitations [R00.2] 07/03/2013  . Morbid obesity ([place]) [E66.01] 12/15/2012  . Postpartum care following vaginal delivery (8/29) [Z39.2] 11/06/2011  . Gestational diabetes mellitus, class A2 [O24.419] 11/05/2011  . Mild preeclampsia [O14.00] 11/05/2011    Total Time spent with patient: 45 minutes  Subjective:   Amber Nielsen is a 30 y.o. female patient admitted with suicidal ideations and not contracting for safety.  HPI:  30 yo female who presented to the ED with her friend, both for admission.  She reports she got upset at work and walked out.  Denies suicidal ideations on assessment but does not "trust myself alone, I have destructive thoughts."  Calm and cooperative, denies past suicide attempts.  Denies hallucinations and homicidal ideations.  Reports she uses cannabis, an 1/8 of a bag a week.  Social drinking on rare occasion.   She states her PCP told her she was bipolar yet she is only prescribed Zoloft and amitriptyline.  Recently separated from her husband and her mother has her two children.  Past Psychiatric History: depression  Risk to Self: Suicidal Ideation: No-Not Currently/Within Last 6 Months Suicidal Intent: No Is patient at risk for suicide?: Yes Suicidal Plan?: Yes-Currently Present Specify Current Suicidal Plan: Overdosing Access to Means: Yes Specify Access to Suicidal Means: Could get  prescriptions What has been your use of drugs/alcohol within the last 12 months?: THC use How many times?: 0 Other Self Harm Risks: None Triggers for Past Attempts: None known Intentional Self Injurious Behavior: None Risk to Others: Homicidal Ideation: No Thoughts of Harm to Others: No Current Homicidal Intent: No Current Homicidal Plan: No Access to Homicidal Means: No Identified Victim: No one History of harm to others?: Yes Assessment of Violence: In distant past Violent Behavior Description: As a child she fought all the time Does patient have access to weapons?: No Criminal Charges Pending?: No Does patient have a court date: No Prior Inpatient Therapy: Prior Inpatient Therapy: No Prior Therapy Dates: N/A Prior Therapy Facilty/Provider(s): N/A Reason for Treatment: N/A Prior Outpatient Therapy: Prior Outpatient Therapy: Yes Prior Therapy Dates: Four years ago Prior Therapy Facilty/Provider(s): [facility] Reason for Treatment: medication Does patient have an ACCT team?: No Does patient have Intensive In-House Services?  : No Does patient have [facility] services? : No Does patient have P4CC services?: No  Past Medical History:  Past Medical History:  Diagnosis Date  . Anxiety   . Asthma    allergy related. rodent animals  . Bipolar 1 disorder ([place])   . Depression   . Gestational diabetes    diet controlled  . H/O varicella   . Headache(784.0)   . HTN (hypertension)   . Kidney stones   . Obesity   . Pregnancy induced hypertension   . Proteinuria complicating pregnancy     Past Surgical History:  Procedure Laterality Date  . ibs    . TOOTH EXTRACTION     Family History:  Family History  Problem Relation Age of Onset  .  Heart disease Father   . Heart attack Father   . Nephrolithiasis Father   . Colon polyps Father   . Mental retardation Maternal Aunt   . Diabetes Maternal Grandmother   . Cancer Maternal Grandmother     ovarian and breast  . Diabetes  Maternal Grandfather   . Diabetes Paternal Grandmother   . Hypertension Paternal Grandmother   . Heart disease Paternal Grandmother   . Colon polyps Paternal Grandmother   . Diabetes Paternal Grandfather   . Colon polyps Paternal Uncle    Family Psychiatric  History: none Social History:  History  Alcohol Use No     History  Drug Use No    Social History   Social History  . Marital status: Married    Spouse name: N/A  . Number of children: 2  . Years of education: N/A   Occupational History  . hair stylist    Social History Main Topics  . Smoking status: Former Smoker    Types: Cigarettes    Quit date: 03/09/2005  . Smokeless tobacco: Never Used  . Alcohol use No  . Drug use: No  . Sexual activity: Yes   Other Topics Concern  . None   Social History Narrative  . None   Additional Social History:    Allergies:   Allergies  Allergen Reactions  . Celery Oil Swelling    Tongue swelling when eating celery  . Cymbalta [Duloxetine Hcl] Other (See Comments)    "out of body"  . Lamictal [Lamotrigine] Other (See Comments)    unknown  . Prozac [Fluoxetine Hcl] Other (See Comments)    Anger problems    Labs:  Results for orders placed or performed during the hospital encounter of 02/01/16 (from the past 48 hour(s))  Comprehensive metabolic panel     Status: Abnormal   Collection Time: 02/01/16  8:53 PM  Result Value Ref Range   Sodium 139 135 - 145 mmol/L   Potassium 4.2 3.5 - 5.1 mmol/L   Chloride 103 101 - 111 mmol/L   CO2 27 22 - 32 mmol/L   Glucose, Bld 113 (H) 65 - 99 mg/dL   BUN 10 6 - 20 mg/dL   Creatinine, Ser 0.71 0.44 - 1.00 mg/dL   Calcium 9.4 8.9 - 10.3 mg/dL   Total Protein 8.2 (H) 6.5 - 8.1 g/dL   Albumin 4.2 3.5 - 5.0 g/dL   AST 43 (H) 15 - 41 U/L   ALT 64 (H) 14 - 54 U/L   Alkaline Phosphatase 71 38 - 126 U/L   Total Bilirubin 0.7 0.3 - 1.2 mg/dL   GFR calc non Af Amer >60 >60 mL/min   GFR calc Af Amer >60 >60 mL/min    Comment:  (NOTE) The eGFR has been calculated using the CKD EPI equation. This calculation has not been validated in all clinical situations. eGFR's persistently <60 mL/min signify possible Chronic Kidney Disease.    Anion gap 9 5 - 15  Ethanol     Status: None   Collection Time: 02/01/16  8:53 PM  Result Value Ref Range   Alcohol, Ethyl (B) <5 <5 mg/dL    Comment:        LOWEST DETECTABLE LIMIT FOR SERUM ALCOHOL IS 5 mg/dL FOR MEDICAL PURPOSES ONLY   Salicylate level     Status: None   Collection Time: 02/01/16  8:53 PM  Result Value Ref Range   Salicylate Lvl <5.9 2.8 - 30.0 mg/dL  Acetaminophen level  Status: Abnormal   Collection Time: 02/01/16  8:53 PM  Result Value Ref Range   Acetaminophen (Tylenol), Serum <10 (L) 10 - 30 ug/mL    Comment:        THERAPEUTIC CONCENTRATIONS VARY SIGNIFICANTLY. A RANGE OF 10-30 ug/mL MAY BE AN EFFECTIVE CONCENTRATION FOR MANY PATIENTS. HOWEVER, SOME ARE BEST TREATED AT CONCENTRATIONS OUTSIDE THIS RANGE. ACETAMINOPHEN CONCENTRATIONS >150 ug/mL AT 4 HOURS AFTER INGESTION AND >50 ug/mL AT 12 HOURS AFTER INGESTION ARE OFTEN ASSOCIATED WITH TOXIC REACTIONS.   cbc     Status: Abnormal   Collection Time: 02/01/16  8:53 PM  Result Value Ref Range   WBC 10.2 4.0 - 10.5 K/uL   RBC 4.69 3.87 - 5.11 MIL/uL   Hemoglobin 14.5 12.0 - 15.0 g/dL   HCT 41.9 36.0 - 46.0 %   MCV 89.3 78.0 - 100.0 fL   MCH 30.9 26.0 - 34.0 pg   MCHC 34.6 30.0 - 36.0 g/dL   RDW 13.6 11.5 - 15.5 %   Platelets 550 (H) 150 - 400 K/uL  Rapid urine drug screen (hospital performed)     Status: Abnormal   Collection Time: 02/01/16  8:59 PM  Result Value Ref Range   Opiates NONE DETECTED NONE DETECTED   Cocaine NONE DETECTED NONE DETECTED   Benzodiazepines NONE DETECTED NONE DETECTED   Amphetamines NONE DETECTED NONE DETECTED   Tetrahydrocannabinol POSITIVE (A) NONE DETECTED   Barbiturates NONE DETECTED NONE DETECTED    Comment:        DRUG SCREEN FOR MEDICAL  PURPOSES ONLY.  IF CONFIRMATION IS NEEDED FOR ANY PURPOSE, NOTIFY LAB WITHIN 5 DAYS.        LOWEST DETECTABLE LIMITS FOR URINE DRUG SCREEN Drug Class       Cutoff (ng/mL) Amphetamine      1000 Barbiturate      200 Benzodiazepine   300 Tricyclics       762 Opiates          300 Cocaine          300 THC              50   POC urine preg, ED     Status: None   Collection Time: 02/01/16  9:02 PM  Result Value Ref Range   Preg Test, Ur NEGATIVE NEGATIVE    Comment:        THE SENSITIVITY OF THIS METHODOLOGY IS >24 mIU/mL     Current Facility-Administered Medications  Medication Dose Route Frequency Provider Last Rate Last Dose  . nicotine (NICODERM CQ - dosed in mg/24 hours) patch 21 mg  21 mg Transdermal Daily Patrecia Pour, NP   21 mg at 02/02/16 2633   Current Outpatient Prescriptions  Medication Sig Dispense Refill  . albuterol (PROVENTIL HFA;VENTOLIN HFA) 108 (90 BASE) MCG/ACT inhaler Inhale 1 puff into the lungs every 6 (six) hours as needed for wheezing or shortness of breath.    Marland Kitchen amitriptyline (ELAVIL) 10 MG tablet Take 20 mg by mouth at bedtime.   5  . clonazePAM (KLONOPIN) 0.5 MG tablet TAKE 1 TABLET BY MOUTH TWICE A DAY AS NEEDED (Patient taking differently: TAKE 1 TABLET BY MOUTH TWICE A DAY AS NEEDED for anxiety) 30 tablet 1  . dicyclomine (BENTYL) 20 MG tablet Take 1 tablet (20 mg total) by mouth 3 (three) times daily before meals. 90 tablet 11  . ibuprofen (ADVIL,MOTRIN) 200 MG tablet Take 400 mg by mouth every 6 (six) hours as needed  for headache.    Marland Kitchen PARAGARD INTRAUTERINE COPPER IUD IUD 1 each by Intrauterine route once.    . sertraline (ZOLOFT) 100 MG tablet Take 150 mg by mouth daily.     . Vitamin D, Ergocalciferol, (DRISDOL) 50000 units CAPS capsule Take 50,000 Units by mouth every 7 (seven) days.   0  . dicyclomine (BENTYL) 10 MG capsule Take 1 capsule (10 mg total) by mouth 3 (three) times daily. (Patient not taking: Reported on 02/01/2016) 90 capsule 3     Musculoskeletal: Strength & Muscle Tone: within normal limits Gait & Station: normal Patient leans: N/A  Psychiatric Specialty Exam: Physical Exam  Constitutional: She is oriented to person, place, and time. She appears well-developed and well-nourished.  HENT:  Head: Normocephalic.  Neck: Normal range of motion.  Respiratory: Effort normal.  Musculoskeletal: Normal range of motion.  Neurological: She is alert and oriented to person, place, and time.  Psychiatric: Her speech is normal and behavior is normal. Judgment normal. Cognition and memory are normal. She exhibits a depressed mood. She expresses suicidal ideation.    Review of Systems  Constitutional: Negative.   HENT: Negative.   Eyes: Negative.   Respiratory: Negative.   Cardiovascular: Negative.   Gastrointestinal: Negative.   Genitourinary: Negative.   Musculoskeletal: Negative.   Skin: Negative.   Neurological: Negative.   Endo/Heme/Allergies: Negative.   Psychiatric/Behavioral: Positive for depression.    Blood pressure (!) 100/47, pulse 71, temperature 97.9 F (36.6 C), temperature source Oral, resp. rate 16, height 5' 10"  (1.778 m), weight 130.2 kg (287 lb), SpO2 98 %.Body mass index is 41.18 kg/m.  General Appearance: Casual  Eye Contact:  Good  Speech:  Normal Rate  Volume:  Normal  Mood:  Depressed  Affect:  Congruent  Thought Process:  Coherent and Descriptions of Associations: Intact  Orientation:  Full (Time, Place, and Person)  Thought Content:  Rumination  Suicidal Thoughts:  No  Homicidal Thoughts:  No  Memory:  Immediate;   Fair Recent;   Fair Remote;   Fair  Judgement:  Fair  Insight:  Fair  Psychomotor Activity:  Normal  Concentration:  Concentration: Good and Attention Span: Good  Recall:  [place] of Knowledge:  Good  Language:  Good  Akathisia:  No  Handed:  Right  AIMS (if indicated):     Assets:  Housing Leisure Time Physical Health Resilience Social Support  ADL's:   Intact  Cognition:  WNL  Sleep:        Treatment Plan Summary: Daily contact with patient to assess and evaluate symptoms and progress in treatment, Medication management and Plan major depressive disorder, recurrent, severe without psychosis:  -Crisis stabilization -Medication management:  Start Celexa 10 mg daily for depression, Vistaril 25 mg every six hours PRN anxiety, and Trazodone 50 mg at bedtime PRN sleep. -Individual counseling  Disposition: Recommend psychiatric Inpatient admission when medically cleared.  Waylan Boga, NP 02/02/2016 10:04 AM   Patient seen face to face for this evaluation, case discussed with treatment team and physician extender and formulated treatment plan. Reviewed the information documented and agree with the treatment plan.  Zamyiah Tino 02/02/2016 10:19 AM
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
## 2016-02-02 NOTE — ED Notes (Signed)
[facility] on unit [facility] order. Pt had no personal property. [facility] given transfer form. Pt signed e-signature. Pt ambulatory off unit.

## 2016-02-02 NOTE — ED Notes (Signed)
SBAR Report received from previous nurse. Pt received calm and visible on unit. Pt denies current SI/ HI, A/V H, depression, anxiety, and pain at this time, and is otherwise stable, but reports feeling sad. Pt reminded of camera surveillance, q 15 min rounds, and rules of the milieu. Pt screened for contraband by Clinical research associatewriter, will continue to assess.

## 2016-02-02 NOTE — BH Specialist Note (Signed)
[facility]:   Per information collected from Beatriz StallionMarcus Harvey initial [facility] assessment; Berta MinorKellie S Nielsen is an 30 y.o. female.  -Clinician reviewed note by Dr. Madilyn Hookees.  Barbee ShropshireKellie S Deyetteis a 30 y.o.femalewho presents to the Emergency Department complaining of SI. She has an ongoing history of suicidal thoughts and symptoms have been worsening over the last several weeks with occasional plans to wreck her car. She has visual hallucinations and sees shadows of people doing regular daily activities. She has a history of bipolar disorder and has been previously on medications but is no longer. She comes in voluntarily today to seek treatment.   Patient was reviewed by Dr. Bethanie DickerKupar as [facility].  Nurse to Nurse report was complete without incident and she is being transported.    [facility] for input in the system.       Maryelizabeth Rowanressa Jaben Benegas, MSW, Tinnie GensLCSW, LCAS, CCSI [facility] Triage Specialist [phone]

## 2016-02-02 NOTE — ED Notes (Signed)
Pt talking on hallway phone.  

## 2016-02-03 ENCOUNTER — Encounter: Payer: Self-pay | Admitting: Psychiatry

## 2016-02-03 DIAGNOSIS — F332 Major depressive disorder, recurrent severe without psychotic features: Principal | ICD-10-CM

## 2016-02-03 MED ORDER — LOPERAMIDE HCL 2 MG PO CAPS
2.0000 mg | ORAL_CAPSULE | Freq: Four times a day (QID) | ORAL | Status: DC | PRN
  Administered 2016-02-03: 2 mg via ORAL
  Filled 2016-02-03: qty 1

## 2016-02-03 NOTE — Progress Notes (Signed)
[facility] MD Progress Note  02/03/2016 11:41 AM Amber Nielsen  MRN:  [id number] Subjective:  Amber Nielsen is a 30 year old currently separated Caucasian female who was transferred from the [place] emergency room to [facility] inpatient psychiatry for inpatient psychiatric hospitalization after endorsing suicidal thoughts. The patient told her friend she did not trust herself to be alone and was having constructive thoughts. She says she has been having suicidal thoughts on and off throughout most of her life that in the past one year they have become more intense and in the past one week, she started to have more feelings of hopelessness.   Patient reports feeling better.. Feels that medications have started to help. She is denying having suicidal ideation today. She has been is sleeping well and has been eating well. Denies having any side effects from medications. Or any physical complaints. She denies having any auditory or visual hallucinations.  Per nursing: Pt awake, alert, oriented on unit today. Appropriately interacts with staff/peers. Attendance in groups encouraged, but pt reports "i'm a little anxious about being around the other patients." Reports good sleep last night without medication to sleep. Reports fair appetite, normal energy level, good concentration. When asked to rate depression pt writes "idk." Rates hopelessness 0/10, anxiety 5/10 (low 0-10 high). States she is "having nicotine withdrawals." Nicotine patch applied as ordered, see MAR. Reports today's goal is "to realize life and the people in my life will still be there when I get home" by "remaining calm and rational." Denies suicidal thoughts today.Deneis HI/AVH. Appears flat, sad.   Principal Problem: Major depressive disorder, recurrent severe without psychotic features ([place]) Diagnosis:   Patient Active Problem List   Diagnosis Date Noted  . Major depressive disorder, recurrent severe without  psychotic features ([place]) [F33.2] 02/02/2016  . Cannabis abuse [F12.10] 02/02/2016  . Morbid obesity ([place]) [E66.01] 12/15/2012   Total Time spent with patient: 30 minutes  Past Psychiatric History: The patient reports having struggled with intermittent passive suicidal thoughts throughout most of her life. She has been on Zoloft for the past 4 years prescribed to her by her PCP but is also tried multiple mood stabilizers including Lamictal and Depakote in the past. She also has had trials of Cymbalta and Prozac but says she could not tolerate either medication. She denies any prior suicide attempts. She is not currently seeing an outpatient psychiatrist. No history of any substance abuse treatment.  Past Medical History:  Past Medical History:  Diagnosis Date  . Anxiety   . Asthma    allergy related. rodent animals  . Bipolar 1 disorder ([place])   . Depression   . Dizziness 07/03/2013  . Gestational diabetes    diet controlled  . H/O varicella   . Headache(784.0)   . HTN (hypertension)   . Kidney stones   . Obesity   . Pregnancy induced hypertension   . Proteinuria complicating pregnancy     Past Surgical History:  Procedure Laterality Date  . ibs    . TOOTH EXTRACTION     Family History:  Family History  Problem Relation Age of Onset  . Heart disease Father   . Heart attack Father   . Nephrolithiasis Father   . Colon polyps Father   . Mental retardation Maternal Aunt   . Diabetes Maternal Grandmother   . Cancer Maternal Grandmother     ovarian and breast  . Diabetes Maternal Grandfather   . Diabetes Paternal Grandmother   . Hypertension Paternal  Grandmother   . Heart disease Paternal Grandmother   . Colon polyps Paternal Grandmother   . Diabetes Paternal Grandfather   . Colon polyps Paternal Uncle    Family Psychiatric  History: The patient reports that her mother struggles with schizophrenia, borderline personality disorder and PTSD   Social History: The patient  was born and raised in the area but says her parents were divorced at an early age. She says her mom left when she was 76 years old. She describes her father as being solid. 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 She has been married for 7 years but separated from her husband for the past one month. She has 2 children, age 39 and 10. Currently her children are under the care of her husband. History  Alcohol Use No     History  Drug Use No    Social History   Social History  . Marital status: Married    Spouse name: N/A  . Number of children: 2  . Years of education: N/A   Occupational History  . hair stylist    Social History Main Topics  . Smoking status: Former Smoker    Types: Cigarettes    Quit date: 03/09/2005  . Smokeless tobacco: Never Used  . Alcohol use No  . Drug use: No  . Sexual activity: Yes   Other Topics Concern  . None   Social History Narrative  . None   Additional Social History:                         Sleep: Good  Appetite:  Good  Current Medications: Current Facility-Administered Medications  Medication Dose Route Frequency Provider Last Rate Last Dose  . acetaminophen (TYLENOL) tablet 650 mg  650 mg Oral Q6H PRN Chauncey Mann, MD      . albuterol (PROVENTIL HFA;VENTOLIN HFA) 108 (90 Base) MCG/ACT inhaler 1 puff  1 puff Inhalation Q6H PRN Chauncey Mann, MD      . alum & mag hydroxide-simeth (MAALOX/MYLANTA) 200-200-20 MG/5ML suspension 30 mL  30 mL Oral Q4H PRN Chauncey Mann, MD      . citalopram (CELEXA) tablet 10 mg  10 mg Oral Daily Chauncey Mann, MD   10 mg at 02/03/16 0901  . dicyclomine (BENTYL) tablet 20 mg  20 mg Oral TID AC Chauncey Mann, MD   20 mg at 02/03/16 0901  . hydrOXYzine (ATARAX/VISTARIL) tablet 25 mg  25 mg Oral Q6H PRN Chauncey Mann, MD      . ibuprofen (ADVIL,MOTRIN) tablet 400 mg  400 mg Oral Q6H PRN Chauncey Mann, MD      . magnesium hydroxide  (MILK OF MAGNESIA) suspension 30 mL  30 mL Oral Daily PRN Chauncey Mann, MD      . nicotine (NICODERM CQ - dosed in mg/24 hours) patch 21 mg  21 mg Transdermal Daily Chauncey Mann, MD   21 mg at 02/03/16 0901  . traZODone (DESYREL) tablet 50 mg  50 mg Oral QHS PRN Chauncey Mann, MD      . Vitamin D (Ergocalciferol) (DRISDOL) capsule 50,000 Units  50,000 Units Oral Q7 days Chauncey Mann, MD   50,000 Units at 02/02/16 1842    Lab Results:  Results for orders placed or performed during the hospital encounter of 02/01/16 (from the past 48 hour(s))  Comprehensive metabolic panel     Status: Abnormal   Collection Time: 02/01/16  8:53 PM  Result Value Ref Range   Sodium 139 135 - 145 mmol/L   Potassium 4.2 3.5 - 5.1 mmol/L   Chloride 103 101 - 111 mmol/L   CO2 27 22 - 32 mmol/L   Glucose, Bld 113 (H) 65 - 99 mg/dL   BUN 10 6 - 20 mg/dL   Creatinine, Ser 0.71 0.44 - 1.00 mg/dL   Calcium 9.4 8.9 - 10.3 mg/dL   Total Protein 8.2 (H) 6.5 - 8.1 g/dL   Albumin 4.2 3.5 - 5.0 g/dL   AST 43 (H) 15 - 41 U/L   ALT 64 (H) 14 - 54 U/L   Alkaline Phosphatase 71 38 - 126 U/L   Total Bilirubin 0.7 0.3 - 1.2 mg/dL   GFR calc non Af Amer >60 >60 mL/min   GFR calc Af Amer >60 >60 mL/min    Comment: (NOTE) The eGFR has been calculated using the CKD EPI equation. This calculation has not been validated in all clinical situations. eGFR's persistently <60 mL/min signify possible Chronic Kidney Disease.    Anion gap 9 5 - 15  Ethanol     Status: None   Collection Time: 02/01/16  8:53 PM  Result Value Ref Range   Alcohol, Ethyl (B) <5 <5 mg/dL    Comment:        LOWEST DETECTABLE LIMIT FOR SERUM ALCOHOL IS 5 mg/dL FOR MEDICAL PURPOSES ONLY   Salicylate level     Status: None   Collection Time: 02/01/16  8:53 PM  Result Value Ref Range   Salicylate Lvl <5.5 2.8 - 30.0 mg/dL  Acetaminophen level     Status: Abnormal   Collection Time: 02/01/16  8:53 PM  Result Value Ref Range   Acetaminophen  (Tylenol), Serum <10 (L) 10 - 30 ug/mL    Comment:        THERAPEUTIC CONCENTRATIONS VARY SIGNIFICANTLY. A RANGE OF 10-30 ug/mL MAY BE AN EFFECTIVE CONCENTRATION FOR MANY PATIENTS. HOWEVER, SOME ARE BEST TREATED AT CONCENTRATIONS OUTSIDE THIS RANGE. ACETAMINOPHEN CONCENTRATIONS >150 ug/mL AT 4 HOURS AFTER INGESTION AND >50 ug/mL AT 12 HOURS AFTER INGESTION ARE OFTEN ASSOCIATED WITH TOXIC REACTIONS.   cbc     Status: Abnormal   Collection Time: 02/01/16  8:53 PM  Result Value Ref Range   WBC 10.2 4.0 - 10.5 K/uL   RBC 4.69 3.87 - 5.11 MIL/uL   Hemoglobin 14.5 12.0 - 15.0 g/dL   HCT 41.9 36.0 - 46.0 %   MCV 89.3 78.0 - 100.0 fL   MCH 30.9 26.0 - 34.0 pg   MCHC 34.6 30.0 - 36.0 g/dL   RDW 13.6 11.5 - 15.5 %   Platelets 550 (H) 150 - 400 K/uL  Rapid urine drug screen (hospital performed)     Status: Abnormal   Collection Time: 02/01/16  8:59 PM  Result Value Ref Range   Opiates NONE DETECTED NONE DETECTED   Cocaine NONE DETECTED NONE DETECTED   Benzodiazepines NONE DETECTED NONE DETECTED   Amphetamines NONE DETECTED NONE DETECTED   Tetrahydrocannabinol POSITIVE (A) NONE DETECTED   Barbiturates NONE DETECTED NONE DETECTED    Comment:        DRUG SCREEN FOR MEDICAL PURPOSES ONLY.  IF CONFIRMATION IS NEEDED FOR ANY PURPOSE, NOTIFY LAB WITHIN 5 DAYS.        LOWEST DETECTABLE LIMITS FOR URINE DRUG SCREEN Drug Class  Cutoff (ng/mL) Amphetamine      1000 Barbiturate      200 Benzodiazepine   809 Tricyclics       983 Opiates          300 Cocaine          300 THC              50   POC urine preg, ED     Status: None   Collection Time: 02/01/16  9:02 PM  Result Value Ref Range   Preg Test, Ur NEGATIVE NEGATIVE    Comment:        THE SENSITIVITY OF THIS METHODOLOGY IS >24 mIU/mL     Blood Alcohol level:  Lab Results  Component Value Date   ETH <5 38/25/0539    Metabolic Disorder Labs: No results found for: HGBA1C, MPG No results found for: PROLACTIN Lab  Results  Component Value Date   CHOL 171 07/03/2013   TRIG 162 (H) 07/03/2013   HDL 45 07/03/2013   LDLCALC 94 07/03/2013    Physical Findings: AIMS:  , ,  ,  ,    CIWA:    COWS:     Musculoskeletal: Strength & Muscle Tone: within normal limits Gait & Station: normal Patient leans: N/A  Psychiatric Specialty Exam: Physical Exam  Constitutional: She is oriented to person, place, and time. She appears well-developed.  HENT:  Head: Normocephalic and atraumatic.  Eyes: Conjunctivae and EOM are normal.  Neck: Normal range of motion.  Musculoskeletal: Normal range of motion.  Neurological: She is alert and oriented to person, place, and time.    Review of Systems  Constitutional: Negative.   HENT: Negative.   Eyes: Negative.   Respiratory: Negative.   Cardiovascular: Negative.   Gastrointestinal: Negative.   Genitourinary: Negative.   Musculoskeletal: Negative.   Skin: Negative.   Neurological: Negative.   Endo/Heme/Allergies: Negative.   Psychiatric/Behavioral: Negative.     Blood pressure 109/79, pulse 65, temperature 98.1 F (36.7 C), temperature source Oral, resp. rate 18, height 5' 10" (1.778 m), weight 128.8 kg (284 lb), SpO2 98 %.Body mass index is 40.75 kg/m.  General Appearance: Well Groomed  Eye Contact:  Good  Speech:  Clear and Coherent  Volume:  Normal  Mood:  Dysphoric  Affect:  Congruent  Thought Process:  Linear and Descriptions of Associations: Intact  Orientation:  Full (Time, Place, and Person)  Thought Content:  Hallucinations: None  Suicidal Thoughts:  No  Homicidal Thoughts:  No  Memory:  Immediate;   Good Recent;   Good Remote;   Good  Judgement:  Fair  Insight:  Fair  Psychomotor Activity:  Decreased  Concentration:  Concentration: Good and Attention Span: Good  Recall:  Good  Fund of Knowledge:  Good  Language:  Good  Akathisia:  No  Handed:    AIMS (if indicated):     Assets:  Armed forces logistics/support/administrative officer Physical Health Social  Support  ADL's:  Intact  Cognition:  WNL  Sleep:  Number of Hours: 8.3     Treatment Plan Summary:  Slowly improving tolerating medications.  Major depressive disorder R/O Borderline Personality Disorder Cannabis use disorder, severe Obesity Prediabetes Moderate: marital problems   Amber Nielsen is a 30 year old currently separated Caucasian female with major depressive disorder and  Borderline Personality disorder who was transferred from [facility] emergency room for inpatient psychiatry hospitalization for medication management, safety and stabilization after endorsing suicidal thoughts with no specific plan in the context of  separating from her husband recently.  Major depressive disorder It was recommended in the [facility] emergency room that she start Celexa 10 mg by mouth daily for anxiety and depressive symptoms and trazodone 50 mg by mouth nightly for insomnia. The patient has had a history of being on mood stabilizers in the past including Depakote and Lamictal which she cannot tolerate. If she does have borderline personality traits, she would benefit more from individual therapy.   Cannabis use disorder, severe: The patient does smoke marijuana on a daily basis. She was advised to abstain from marijuana and all illicit drugs as they may worsen mood symptoms.  Obesity and elevated BMI: The patient was encouraged exercise and regular basis for at least 30 minutes, 3-5 times a week and to monitor her diet low in carbohydrates and fat.  Nicotine use disorder: Will offer an NicoDerm patch  Disposition: The patient has a stable living situation in [place]. She will need psychotropic medication management follow-up appointment with the psychiatrist that she has been getting medications from her PCP.  Possible discharge in about 3 days  Hildred Priest, MD 02/03/2016, 11:41 AM

## 2016-02-03 NOTE — Progress Notes (Signed)
Pt awake, alert, oriented on unit today. Appropriately interacts with staff/peers. Attendance in groups encouraged, but pt reports "i'm a little anxious about being around the other patients." Reports good sleep last night without medication to sleep. Reports fair appetite, normal energy level, good concentration. When asked to rate depression pt writes "idk." Rates hopelessness 0/10, anxiety 5/10 (low 0-10 high). States she is "having nicotine withdrawals." Nicotine patch applied as ordered, see MAR. Reports today's goal is "to realize life and the people in my life will still be there when I get home" by "remaining calm and rational." Denies suicidal thoughts today.Deneis HI/AVH. Appears flat, sad.  Support and encouragement provided with use of therapeutic communication. Medications administered as ordered with education. Safety maintained with every 15 minute checks. Will continue to monitor.

## 2016-02-03 NOTE — Tx Team (Signed)
Initial Treatment Plan 02/03/2016 7:30 AM Amber ShropshireKellie S Nielsen ZOX:[id number]    PATIENT STRESSORS: Marital or family conflict Medication change or noncompliance Substance abuse   PATIENT STRENGTHS: Average or above average intelligence Capable of independent living Communication skills Work skills   PATIENT IDENTIFIED PROBLEMS: Major depression 02/02/2016  Suicidal ideation 02/02/2016                   DISCHARGE CRITERIA:  Ability to meet basic life and health needs Adequate post-discharge living arrangements Verbal commitment to aftercare and medication compliance  PRELIMINARY DISCHARGE PLAN: Attend aftercare/continuing care group Return to previous living arrangement Return to previous work or school arrangements  PATIENT/FAMILY INVOLVEMENT: This treatment plan has been presented to and reviewed with the patient, Amber Nielsen, and/or family member,  The patient and family have been given the opportunity to ask questions and make suggestions.  Leonarda SalonGigi George Arsenia Goracke, RN 02/03/2016, 7:30 AM

## 2016-02-03 NOTE — Progress Notes (Signed)
Pt currently denies SI/HI/AVH. Spent a lot of time on the phone.Did not attend  evening group. Staff reported some inappropriate laughter. Isolated to room if not on phone. Denies pain. Voices no additional concerns at this time. Safety maintained. Will continue to monitor.

## 2016-02-03 NOTE — [facility] Suicide Risk Assessment (Signed)
[facility] INPATIENT:  Family/Significant Other Suicide Prevention Education  Suicide Prevention Education:  Education Completed; grandmother, Amber Nielsen ph#: [phone] has been identified by the patient as the family member/significant other with whom the patient will be residing, and identified as the person(s) who will aid the patient in the event of a mental health crisis (suicidal ideations/suicide attempt).  With written consent from the patient, the family member/significant other has been provided the following suicide prevention education, prior to the and/or following the discharge of the patient. Pt's grandmother stated that she has been concerned about patient for a while and felt that she needed treatment. She stated that she spoke with patient's husband about treatment and that his guns are safely secured.  The suicide prevention education provided includes the following:  Suicide risk factors  Suicide prevention and interventions  National Suicide Hotline telephone number  [facility] assessment telephone number  [facility] Emergency Assistance 911  [facility] and/or Residential Mobile Crisis Unit telephone number  Request made of family/significant other to:  Remove weapons (e.g., guns, rifles, knives), all items previously/currently identified as safety concern.    Remove drugs/medications (over-the-counter, prescriptions, illicit drugs), all items previously/currently identified as a safety concern.  The family member/significant other verbalizes understanding of the suicide prevention education information provided.  The family member/significant other agrees to remove the items of safety concern listed above.  Amber Nielsen,  MSW, LCSW-A 02/03/2016, 2:16 PM

## 2016-02-03 NOTE — Progress Notes (Signed)
Pt called this nurse to her room to report that she has been having yellow/odorus vaginal discharge. Pt states "I dont know if it's my IUD or what. I feel like I stink and I constantly feel the need to shower." Pt also stated this has been going on over a week. Asked this nurse to "leave a note for the doctor." Will continue to monitor.

## 2016-02-03 NOTE — Progress Notes (Signed)
Recreation Therapy Notes  INPATIENT RECREATION THERAPY ASSESSMENT  Patient Details Name: Amber Nielsen MRN: [id number] DOB: 08/09/1985 Today's Date: 02/03/2016  Patient Stressors: Family, Other (Comment) (Family is upset about her separation from her husband; old boyfriend is back in patient's life and they are seeing where things are going)  Coping Skills:   Isolate, Substance Abuse, Avoidance, Exercise, Art/Dance, Talking, Music, Other (Comment) (Clean, go to the park with kids, work, go to a friend's house)  Personal Challenges: Concentration, Decision-Making, Relationships, Self-Esteem/Confidence, Restaurant manager, fast foodocial Interaction, Stress Management, Substance Abuse, Trusting Others  Leisure Interests (2+):  Music - Listen, Music - Singing, Individual - Other (Comment) (Reading)  Awareness of Community Resources:  Yes  Community Resources:  [employer]  Current Use: No  If no, Barriers?: Other (Comment) (Works on Sundays)  Patient Strengths:  Face, optimistic  Patient Identified Areas of Improvement:  Coping with panic moments  Current Recreation Participation:  Nothing  Patient Goal for Hospitalization:  To calm down  [place]  [place] Residence[place]  [place]   Current [place] (including self-harm):  No  Current HI:  No  Consent to Intern Participation: N/A   Jacquelynn CreeGreene,Emilly Lavey M, LRT/CTRS 02/03/2016, 4:38 PM

## 2016-02-03 NOTE — BHH Counselor (Signed)
Adult Comprehensive Assessment  Patient ID: Amber Nielsen, female   DOB: 11/05/1985, 30 y.o.   MRN: [id number]  Information Source: Information source: Patient  Current Stressors:  Educational / Learning stressors: No stressors identified  Employment / Job issues: No stressors identified  Family Relationships: No stressors identified  Surveyor, quantityinancial / Lack of resources (include bankruptcy): No stressors identified  Housing / Lack of housing: No stressors identified  Physical health (include injuries & life threatening diseases): No stressors identified  Social relationships: No stressors identified  Substance abuse: No stressors identified  Bereavement / Loss: No stressors identified   Living/Environment/Situation:  Living Arrangements: Spouse/significant other, Children Living conditions (as described by patient or guardian): Feel very comfortable at home  How long has patient lived in current situation?: 1 year and a half  What is atmosphere in current home: Supportive, Comfortable  Family History:  Marital status: Separated Separated, when?: Currently seperating - still lives together  What types of issues is patient dealing with in the relationship?: Strained relationship - "reached a point where they are not meant"  What is your sexual orientation?: Heterosexual  Has your sexual activity been affected by drugs, alcohol, medication, or emotional stress?: None identified  Does patient have children?: Yes How many children?: 2 How is patient's relationship with their children?: 43259 year old and 28 year old - really great relationship   Childhood History:  By whom was/is the patient raised?: Father Description of patient's relationship with caregiver when they were a child: Very great relationship - strong figure  Patient's description of current relationship with people who raised him/her: Still strong relationship with father  How were you disciplined when you got in trouble as a  child/adolescent?: Spankings - disciplinary talks  Does patient have siblings?: Yes Number of Siblings: 3 Description of patient's current relationship with siblings: 1 brother, 2 sisters - has a great relationship  Did patient suffer any verbal/emotional/physical/sexual abuse as a child?: Yes Did patient suffer from severe childhood neglect?: No Has patient ever been sexually abused/assaulted/raped as an adolescent or adult?: No Was the patient ever a victim of a crime or a disaster?: No Witnessed domestic violence?: No Has patient been effected by domestic violence as an adult?: No  Education:  Highest grade of school patient has completed: Associate's degree Currently a student?: No Learning disability?: No  Employment/Work Situation:   Employment situation: Employed Where is patient currently employed?: [employer]  How long has patient been employed?: 2011 Patient's job has been impacted by current illness: Yes Describe how patient's job has been impacted: 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, does not work hard anymore  What is the longest time patient has a held a job?: 6 years  Where was the patient employed at that time?: [employer]  Has patient ever been in the [employer] and Companymilitary?: No Has patient ever served in combat?: No Did You Receive Any Psychiatric Treatment/Services While in Equities traderthe Military?: No Are There Guns or Other Weapons in Your Home?: Yes Types of Guns/Weapons: Handgun and shot gun  Are These ComptrollerWeapons Safely Secured?: Yes  Financial Resources:   Financial resources: Income from employment, Income from spouse Does patient have a Lawyerrepresentative payee or guardian?: No  Alcohol/Substance Abuse:   What has been your use of drugs/alcohol within the last 12 months?: Marijuana daily use  If attempted suicide, did drugs/alcohol play a role in this?: No Alcohol/Substance Abuse Treatment Hx: Denies past history  Has alcohol/substance  abuse ever caused legal problems?: No  Social Support System:   Patient's Community Support System: Good Describe Community Support System: Pt has a great relationship with her family memebers and community  Type of faith/religion: Christianity  How does patient's faith help to cope with current illness?: Prayer  Leisure/Recreation:   Leisure and Hobbies: Reading, listening to music, knitting, home making skills   Strengths/Needs:   What things does the patient do well?: Really good with hair, good communication skills  In what areas does patient struggle / problems for patient: Being able to accept things without being upset   Discharge Plan:   Does patient have access to transportation?: Yes Will patient be returning to same living situation after discharge?: Yes Currently receiving community mental health services: No If no, would patient like referral for services when discharged?: Yes (What county?) Medical sales representative([place] ) Does patient have financial barriers related to discharge medications?: No  Summary/Recommendations:   Summary and Recommendations (to be completed by the evaluator): Patient presented to the hospital admitted for suicidal thoughts and depressive symptoms. Pt's primary diagnosis is major depressive disorder. Pt reports primary triggers for admission were depressive symptoms and not feeling in control of actions. Pt reports her stressors are a change in medications, relationship stressors, and not having control of actions. Pt currently denies SI/HI/AVH.  [place], [place]. Pt lists supports in the community as her close family members; especially her father.  Patient will benefit from crisis stabilization, medication evaluation, group therapy, and psycho education in addition to case management for discharge planning. Patient and CSW reviewed pt's identified goals and treatment plan. Pt verbalized understanding and agreed to treatment plan.  At discharge it is  recommended that patient remain compliant with established plan and continue treatment.  Lynden OxfordKadijah R Marissa Nielsen, MSW, LCSW-A  02/03/2016

## 2016-02-04 MED ORDER — CLOTRIMAZOLE 1 % VA CREA
1.0000 | TOPICAL_CREAM | Freq: Every day | VAGINAL | Status: DC
  Administered 2016-02-04: 1 via VAGINAL
  Filled 2016-02-04: qty 45

## 2016-02-04 MED ORDER — CITALOPRAM HYDROBROMIDE 10 MG PO TABS: 10.0000 mg | ORAL_TABLET | Freq: Every day | ORAL | 0 refills | Status: DC

## 2016-02-04 MED ORDER — TRAZODONE HCL 50 MG PO TABS
25.0000 mg | ORAL_TABLET | Freq: Every evening | ORAL | Status: DC | PRN
  Administered 2016-02-04: 25 mg via ORAL
  Filled 2016-02-04: qty 1

## 2016-02-04 MED ORDER — TRAZODONE HCL 50 MG PO TABS: 25.0000 mg | ORAL_TABLET | Freq: Every evening | ORAL | 0 refills | Status: DC | PRN

## 2016-02-04 MED ORDER — CLOTRIMAZOLE 1 % VA CREA: 1.0000 | TOPICAL_CREAM | Freq: Every day | VAGINAL | 0 refills | Status: AC

## 2016-02-04 NOTE — Progress Notes (Signed)
D: Pt denies SI/HI/AVH. Pt is pleasant and cooperative, affect is flat, but brightens upon approach. Pt appears less anxious and she is interacting with peers and staff appropriately.  A: Pt was offered support and encouragement. Pt was given scheduled medications. Pt was encouraged to attend groups. Q 15 minute checks were done for safety.  R:Pt attends group and interacts well with peers and staff Pt is taking medication. Pt has no complaints.Pt receptive to treatment and safety maintained on unit.

## 2016-02-04 NOTE — Plan of Care (Signed)
Problem: Education: Goal: Verbalization of understanding the information provided will improve Outcome: Progressing Patient verbalized feelings for the family.   Problem: Coping: Goal: Ability to verbalize frustrations and anger appropriately will improve Outcome: Progressing Patient verbalized frustration.

## 2016-02-04 NOTE — [facility] Suicide Risk Assessment (Signed)
[facility]   Principal Problem: Major depressive disorder, recurrent severe without psychotic features [facility](HCC) Discharge Diagnoses:  Patient Active Problem List   Diagnosis Date Noted  . Major depressive disorder, recurrent severe without psychotic features (HCC) [F33.2] 02/02/2016  . Cannabis abuse [F12.10] 02/02/2016  . Morbid obesity (HCC) [E66.01] 12/15/2012    Psychiatric Specialty Exam: ROS  Blood pressure 121/71, pulse 83, temperature 98 F (36.7 C), resp. rate 18, height 5\' 10"  (1.778 m), weight 128.8 kg (284 lb), SpO2 98 %.Body mass index is 40.75 kg/m.                                                       Mental Status Per Nursing Assessment::   On Admission:     Demographic Factors:  Caucasian  Loss Factors: Loss of significant relationship  Historical Factors: Impulsivity  Risk Reduction Factors:   Sense of responsibility to family and Positive social support  Denies having access to guns  Continued Clinical Symptoms:  Personality Disorders:   Cluster B Previous Psychiatric Diagnoses and Treatments  Cognitive Features That Contribute To Risk:  Polarized thinking    Suicide Risk:  Minimal: No identifiable suicidal ideation.  Patients presenting with no risk factors but with morbid ruminations; may be classified as minimal risk based on the severity of the depressive symptoms  Follow-up Information    [facility] Go [facility]2016.   Why:  Please plan to meet with your therapist, Lavada MesiBeth McKenzie on December 12th at 12 o'clock noon. Please bring your discharge paperwork and insurance card to this appointment. Arrive 15 minutes early for paperwork. Contact information: Address: [address] [place], [place] [address] Phone: [phone] Fax: [phone]       [facility]. Go on 03/12/2016.   Why:  Please plan to meet with your psychiatrist, Dr. Peggyann ShoalsArseen on  Jan. 4th at [facility] for medication management. It is important to bring your discharge paperwork to this appointment. Arrive 15 minutes early.If you have any questions, contact [facility]. Contact information: Address: [address] [place], [place] [address] Phone: [phone] Fax: [phone]       [facility]. Go on 02/06/2016.   Why:  In case an emergency arises, please know that [facility] a walk[facility]in [facility] for crises. The walk-in clinic hours are M-F 9AM-3PM. Please be sure to bring your discharge paperwork to this appointment if neccessary.  Contact information: Address: [address] [place], [place] [address] Phone: [phone] Fax: [phone]           Jimmy FootmanHernandez-Gonzalez,  Jguadalupe Opiela, MD 02/05/2016, 8:42 AM

## 2016-02-04 NOTE — Plan of Care (Signed)
Problem: Coping: Goal: Ability to verbalize feelings will improve Outcome: Progressing States that she is here because she was just emotionally overwhelmed.

## 2016-02-04 NOTE — Discharge Summary (Signed)
Physician Discharge Summary Note  Patient:  Amber Nielsen is an 30 y.o., female MRN:  [id number] DOB:  [birth year] Patient phone:  [phone] (home)  Patient address:   [address]  [place] [address],  Total Time spent with patient: 30 minutes  Date of Admission:  02/02/2016 Date of Discharge: 02/05/16  Reason for Admission:  SI  Principal Problem: Major depressive disorder, recurrent severe without psychotic features [facility]) Discharge Diagnoses: Patient Active Problem List   Diagnosis Date Noted  . Major depressive disorder, recurrent severe without psychotic features ([place]) [F33.2] 02/02/2016  . Cannabis abuse [F12.10] 02/02/2016  . Morbid obesity ([place]) [E66.01] 12/15/2012   History of Present Illness:  Ms. Amber Nielsen is a 30 year old currently separated Caucasian female who was transferred from the [facility] emergency room to [facility] inpatient psychiatry for inpatient psychiatric hospitalization after endorsing suicidal thoughts. The patient told her friend she did not trust herself to be alone and was having constructive thoughts. She says she has been having suicidal thoughts on and off throughout most of her life that in the past one year they have become more intense and in the past one week, she started to have more feelings of hopelessness. She is expressing some anxiety that she may be in trouble at her job because her depressive symptoms have prevented her from not performing to the best of her abilities. She does endorse frequent crying spells, low energy level, anhedonia and difficulty with insomnia. She says she has difficulty with onset of sleep and staying asleep and has been resistant to taking any medications for sleep because she wants to be awake for her children if they wake up at night. She denies any history of any prior suicide attempts in the past. She has not ever seen a psychiatrist in the past but has been getting  antidepressants and even mood stabilizers from her PCP. She denies any history of any psychotic symptoms including auditory or visual hallucinations. No paranoid thoughts or delusions. She says she was once diagnosed with bipolar and has tried stabilizers including Lamictal and Depakote. The patient says she could not tolerate either medication. She does endorse some traits consistent with borderline personality including chronic feelings of emptiness, low self-esteem and feelings of low self-worth. She also describes herself as "a people pleaser". She and her husband are separating but are still living in the same household raising 2 children, age 95 and 76. The patient does smoke marijuana on a daily basis since her mid teens but denies any other illicit drug use. She denies any heavy alcohol use and says she only rarely drinks alcohol socially. She has been on Zoloft or years and amitriptyline over the past few months.   Past psychiatric history: The patient reports having struggled with intermittent passive suicidal thoughts throughout most of her life. She has been on Zoloft for the past 4 years prescribed to her by her PCP but is also tried multiple mood stabilizers including Lamictal and Depakote in the past. She also has had trials of Cymbalta and Prozac but says she could not tolerate either medication. She denies any prior suicide attempts. She is not currently seeing an outpatient psychiatrist. No history of any substance abuse treatment.  Substance abuse history: The patient has been using marijuana daily since her mid teens. She denies any cocaine, opiate, or stimulant use. She says that one of her prior doctors got addicted to Xanax in the past but she denies any current benzo  use. She denies any history of any heavy alcohol use and says she only rarely drinks alcohol socially.  Family psychiatric history: The patient reports that her mother struggles with schizophrenia, borderline  personality disorder and PTSD   Social history: The patient was born and raised in the area but says her parents were divorced at an early age. She says her mom left when she was 77 years old. She describes her father as being solid. 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 She has been married for 7 years but separated from her husband for the past one month. She has 2 children, age 58 and 16. Currently her children are under the care of her husband.  Legal history: The patient denies any history of any prior arrest or incarcerations.  Associated Signs/Symptoms: Depression Symptoms:  depressed mood, anhedonia, insomnia, fatigue, feelings of worthlessness/guilt, difficulty concentrating, hopelessness, recurrent thoughts of death, suicidal thoughts without plan, anxiety, loss of energy/fatigue, (Hypo) Manic Symptoms:  She denies any manic symptoms but was given a diagnosis of Bipolar in the past Anxiety Symptoms:  Excessive Worry, Psychotic Symptoms:  None PTSD Symptoms: Negative Total Time spent with patient: 45 minutes  Past Medical History:  Past Medical History:  Diagnosis Date  . Anxiety   . Asthma    allergy related. rodent animals  . Bipolar 1 disorder ([place])   . Depression   . Dizziness 07/03/2013  . Gestational diabetes    diet controlled  . H/O varicella   . Headache(784.0)   . HTN (hypertension)   . Kidney stones   . Obesity   . Pregnancy induced hypertension   . Proteinuria complicating pregnancy     Past Surgical History:  Procedure Laterality Date  . ibs    . TOOTH EXTRACTION     Family History:  Family History  Problem Relation Age of Onset  . Heart disease Father   . Heart attack Father   . Nephrolithiasis Father   . Colon polyps Father   . Mental retardation Maternal Aunt   . Diabetes Maternal Grandmother   . Cancer Maternal Grandmother     ovarian and breast  .  Diabetes Maternal Grandfather   . Diabetes Paternal Grandmother   . Hypertension Paternal Grandmother   . Heart disease Paternal Grandmother   . Colon polyps Paternal Grandmother   . Diabetes Paternal Grandfather   . Colon polyps Paternal Uncle     Social History:  History  Alcohol Use No     History  Drug Use No    Social History   Social History  . Marital status: Married    Spouse name: N/A  . Number of children: 2  . Years of education: N/A   Occupational History  . hair stylist    Social History Main Topics  . Smoking status: Former Smoker    Types: Cigarettes    Quit date: 03/09/2005  . Smokeless tobacco: Never Used  . Alcohol use No  . Drug use: No  . Sexual activity: Yes   Other Topics Concern  . None   Social History Narrative  . None    Hospital Course:    Major depressive disorder R/O Borderline Personality Disorder Cannabis use disorder, severe Obesity Prediabetes Moderate: marital problems   Ms. Deyette is a 30 year old currently separated Caucasian female with major depressive disorder and  Borderline Personality disorder who was transferred from [facility] emergency room for  inpatient psychiatry hospitalization for medication management, safety and stabilization after endorsing suicidal thoughts with no specific plan in the context of separating from her husband recently.  Major depressive disorder It was recommended in the [facility] emergency room that she start Celexa 10 mg by mouth daily for anxiety and depressive symptoms and trazodone 50 mg by mouth nightly for insomnia. The patient has had a history of being on mood stabilizers in the past including Depakote and Lamictal which she cannot tolerate. If she does have borderline personality traits, she would benefit more from individual therapy.   Cannabis use disorder, severe: The patient does smoke marijuana on a daily basis. She was advised to abstain from marijuana and all illicit  drugs as they may worsen mood symptoms.  Obesity and elevated BMI: The patient was encouraged exercise and regular basis for at least 30 minutes, 3-5 times a week and to monitor her diet low in carbohydrates and fat.  Nicotine use disorder: pt received NicoDerm patch  Disposition: The patient has a stable living situation in [place]. She will need psychotropic medication management follow-up appointment with the psychiatrist that she has been getting medications from her PCP.  On discharge the patient reports significant improvement in mood, appetite, energy, sleep and concentration. She is no longer hopeless or helpless. She denies suicidality, homicidality or having auditory or visual hallucinations. Patient is tolerating medications well. Finds them helpful. She denies any physical complaints today.  She has been very active around the unit, has displayed appropriate interactions with staff, peers and has been attending programming.  She has not displayed any unsafe or disruptive behavior. She did not require seclusion, restraints or forced medications.  Stop working with the patient does not have any concerns about her safety  On examination her mood is described as euthymic, her affect is bright and reactive, she appears hopeful and future oriented. She has a supportive family that she is looking forward to see.  Physical Findings: AIMS:  , ,  ,  ,    CIWA:    COWS:     Musculoskeletal: Strength & Muscle Tone: within normal limits Gait & Station: normal Patient leans: N/A  Psychiatric Specialty Exam: Physical Exam  Constitutional: She is oriented to person, place, and time. She appears well-nourished.  HENT:  Head: Normocephalic and atraumatic.  Neck: Normal range of motion.  Cardiovascular: Regular rhythm.   Respiratory: Effort normal.  Musculoskeletal: Normal range of motion.  Neurological: She is alert and oriented to person, place, and time.    Review of Systems   Constitutional: Negative.   HENT: Negative.   Eyes: Negative.   Respiratory: Negative.   Cardiovascular: Negative.   Gastrointestinal: Negative.   Musculoskeletal: Negative.   Skin: Negative.   Neurological: Negative.   Endo/Heme/Allergies: Negative.   Psychiatric/Behavioral: Negative.  Negative for depression, hallucinations, memory loss, substance abuse and suicidal ideas. The patient is not nervous/anxious and does not have insomnia.     Blood pressure 121/71, pulse 83, temperature 98 F (36.7 C), resp. rate 18, height 5' 10"  (1.778 m), weight 128.8 kg (284 lb), SpO2 98 %.Body mass index is 40.75 kg/m.  General Appearance: Well Groomed  Eye Contact:  Good  Speech:  Clear and Coherent  Volume:  Normal  Mood:  Euthymic  Affect:  Appropriate and Congruent  Thought Process:  Linear and Descriptions of Associations: Intact  Orientation:  Full (Time, Place, and Person)  Thought Content:  Hallucinations: None  Suicidal Thoughts:  No  Homicidal  Thoughts:  No  Memory:  Immediate;   Good Recent;   Good Remote;   Good  Judgement:  Fair  Insight:  Fair  Psychomotor Activity:  Normal  Concentration:  Concentration: Good and Attention Span: Good  Recall:  Good  Fund of Knowledge:  Good  Language:  Good  Akathisia:  No  Handed:    AIMS (if indicated):     Assets:  Communication Skills Housing Physical Health  ADL's:  Intact  Cognition:  WNL  Sleep:  Number of Hours: 7.5     Have you used any form of tobacco in the last 30 days? (Cigarettes, Smokeless Tobacco, Cigars, and/or Pipes): Yes  Has this patient used any form of tobacco in the last 30 days? (Cigarettes, Smokeless Tobacco, Cigars, and/or Pipes) Yes, Yes, A prescription for an FDA-approved tobacco cessation medication was offered at discharge and the patient refused  Blood Alcohol level:  Lab Results  Component Value Date   ETH <5 16/12/9602    Metabolic Disorder Labs:  No results found for: HGBA1C, MPG No  results found for: PROLACTIN Lab Results  Component Value Date   CHOL 171 07/03/2013   TRIG 162 (H) 07/03/2013   HDL 45 07/03/2013   [place] 94 07/03/2013   Results for SOLANGEL, MCMANAWAY (MRN [id number]) as of 02/04/2016 15:00  Ref. Range 02/01/2016 20:53 02/01/2016 20:59 02/01/2016 21:02  Sodium Latest Ref Range: 135 - 145 mmol/L 139    Potassium Latest Ref Range: 3.5 - 5.1 mmol/L 4.2    Chloride Latest Ref Range: 101 - 111 mmol/L 103    CO2 Latest Ref Range: 22 - 32 mmol/L 27    BUN Latest Ref Range: 6 - 20 mg/dL 10    Creatinine Latest Ref Range: 0.44 - 1.00 mg/dL 0.71    Calcium Latest Ref Range: 8.9 - 10.3 mg/dL 9.4    EGFR (Non-African Amer.) Latest Ref Range: >60 mL/min >60    EGFR (African American) Latest Ref Range: >60 mL/min >60    Glucose Latest Ref Range: 65 - 99 mg/dL 113 (H)    Anion gap Latest Ref Range: 5 - 15  9    Alkaline Phosphatase Latest Ref Range: 38 - 126 U/L 71    Albumin Latest Ref Range: 3.5 - 5.0 g/dL 4.2    AST Latest Ref Range: 15 - 41 U/L 43 (H)    ALT Latest Ref Range: 14 - 54 U/L 64 (H)    Total Protein Latest Ref Range: 6.5 - 8.1 g/dL 8.2 (H)    Total Bilirubin Latest Ref Range: 0.3 - 1.2 mg/dL 0.7    WBC Latest Ref Range: 4.0 - 10.5 K/uL 10.2    RBC Latest Ref Range: 3.87 - 5.11 MIL/uL 4.69    Hemoglobin Latest Ref Range: 12.0 - 15.0 g/dL 14.5    HCT Latest Ref Range: 36.0 - 46.0 % 41.9    MCV Latest Ref Range: 78.0 - 100.0 fL 89.3    MCH Latest Ref Range: 26.0 - 34.0 pg 30.9    MCHC Latest Ref Range: 30.0 - 36.0 g/dL 34.6    RDW Latest Ref Range: 11.5 - 15.5 % 13.6    Platelets Latest Ref Range: 150 - 400 K/uL 550 (H)    Acetaminophen (Tylenol), S Latest Ref Range: 10 - 30 ug/mL <47 (L)    Salicylate Lvl Latest Ref Range: 2.8 - 30.0 mg/dL <7.0    Preg Test, Ur Latest Ref Range: NEGATIVE    NEGATIVE  Alcohol,  Ethyl (B) Latest Ref Range: <5 mg/dL <5    Amphetamines Latest Ref Range: NONE DETECTED   NONE DETECTED   Barbiturates Latest Ref  Range: NONE DETECTED   NONE DETECTED   Benzodiazepines Latest Ref Range: NONE DETECTED   NONE DETECTED   Opiates Latest Ref Range: NONE DETECTED   NONE DETECTED   COCAINE Latest Ref Range: NONE DETECTED   NONE DETECTED   Tetrahydrocannabinol Latest Ref Range: NONE DETECTED   POSITIVE (A)    See Psychiatric Specialty Exam and Suicide Risk Assessment completed by Attending Physician prior to discharge.  Discharge destination:  Home  Is patient on multiple antipsychotic therapies at discharge:  No   Has Patient had three or more failed trials of antipsychotic monotherapy by history:  No  Recommended Plan for Multiple Antipsychotic Therapies: NA     Medication List    STOP taking these medications   amitriptyline 10 MG tablet Commonly known as:  ELAVIL   clonazePAM 0.5 MG tablet Commonly known as:  KLONOPIN   dicyclomine 10 MG capsule Commonly known as:  BENTYL   dicyclomine 20 MG tablet Commonly known as:  BENTYL   ibuprofen 200 MG tablet Commonly known as:  ADVIL,MOTRIN   PARAGARD INTRAUTERINE COPPER Iud IUD   sertraline 100 MG tablet Commonly known as:  ZOLOFT     TAKE these medications     Indication  albuterol 108 (90 Base) MCG/ACT inhaler Commonly known as:  PROVENTIL HFA;VENTOLIN HFA Inhale 1 puff into the lungs every 6 (six) hours as needed for wheezing or shortness of breath.  Indication:  Disease Involving Spasms of the Bronchus   citalopram 10 MG tablet Commonly known as:  CELEXA Take 1 tablet (10 mg total) by mouth daily.  Indication:  Depression   clotrimazole 1 % vaginal cream Commonly known as:  GYNE-LOTRIMIN Place 1 Applicatorful vaginally at bedtime.  Indication:  Vagina and Vulva Infection due to Candida Species Fungus   traZODone 50 MG tablet Commonly known as:  DESYREL Take 0.5 tablets (25 mg total) by mouth at bedtime as needed for sleep.  Indication:  Trouble Sleeping   Vitamin D (Ergocalciferol) 50000 units Caps capsule Commonly  known as:  DRISDOL Take 50,000 Units by mouth every 7 (seven) days.  Indication:  Vitamin D Deficiency      Follow-up Information    [facility]. Go on 02/18/2016.   Why:  Please plan to meet with your therapist, Charolotte Eke on December 12th at 12 o'clock noon. Please bring your discharge paperwork and insurance card to this appointment. Arrive 15 minutes early for paperwork. Contact information: Address: [address] [place], [place] [address] Phone: [phone] Fax: [phone]       [facility]. Go on 03/12/2016.   Why:  Please plan to meet with your psychiatrist, Dr. Viviana Simpler on Jan. 4th at [facility] for medication management. It is important to bring your discharge paperwork to this appointment. Arrive 15 minutes early.If you have any questions, contact [facility]. Contact information: Address: [address] [place], [place] [address] Phone: [phone] Fax: [phone]       [facility]. Go on 02/06/2016.   Why:  In case an emergency arises, please know that [facility] has a walk-in clinic for crises. The walk-in clinic hours are M-F 9AM-3PM. Please be sure to bring your discharge paperwork to this appointment if neccessary.  Contact information: Address: [address] [place],  [address] Phone: (  [phone] Fax: [phone]           Signed: Hildred Priest, MD 02/05/2016, 8:41 AM

## 2016-02-04 NOTE — [facility] Group Notes (Signed)
[facility] Group Notes:  (Nursing/MHT/Case Management/Adjunct)  Date:  02/04/2016  Time:  1:54 PM  Type of Therapy:  Psychoeducational Skills  Participation Level:  Did Not Attend  Twanna Hymanda C Sammy Cassar 02/04/2016, 1:54 PM

## 2016-02-04 NOTE — Plan of Care (Signed)
Problem: [facility] Participation in Recreation Therapeutic Interventions Goal: STG-Patient will demonstrate improved self esteem by identif STG: Self-Esteem - Within 4 treatment sessions, patient will verbalize at least 5 positive affirmation statements in each of 2 treatment sessions to increase self-esteem post d/c.  Outcome: Progressing Treatment Session 1; Completed 1 out of 2: At approximately 1:35 pm, LRT met with patient in consultation room. Patient verbalized 5 positive affirmation statements. Patient reported it felt "very nice". LRT encouraged patient to continue saying positive affirmation statements.  Leonette [facility], LRT/CTRS 11.28.17 2:35 pm Goal: STG-Other Recreation Therapy Goal (Specify) STG: Stress Management - Within 4 treatment sessions, patient will verbalize understanding of the stress management techniques in each of 2 treatment sessions to increase stress management skills post d/c.  Outcome: Progressing Treatment Session 1; Completed 1 out of 2: At approximately 1:35 pm, LRT met with patient in consultation room. LRT educated and provided patient with handouts on stress management techniques. Patient verbalized understanding. LRT encouraged patient to read over and practice the stress management techniques.  Leonette [facility], LRT/CTRS 11.28.17 2:37 pm

## 2016-02-04 NOTE — Progress Notes (Signed)
Recreation Therapy Notes  Date: 11.28.17 Time: 1:00 pm Location: Craft Room  Group Topic: Self-expression  Goal Area(s) Addresses:  Patient will identify one color per emotion listed on the wheel. Patient will verbalize one emotion experienced during session. Patient will be educated on other forms of self-expression  Behavioral Response: Did not attend  Intervention: Emotion Wheel  Activity: Patients were given an Emotion Wheel worksheet and were instructed to pick a color for each emotion listed on the wheel.  Education: LRT educated patients on different forms of self-expression.  Education Outcome: Patient did not attend group.  Clinical Observations/Feedback: Patient did not attend group.  Jayjay Littles M, LRT/CTRS 02/04/2016 4:00 PM 

## 2016-02-04 NOTE — Progress Notes (Signed)
[facility]  02/04/2016 2:47 PM Berta MinorKellie S Deyette  MRN:  [id number] Subjective:  Ms. Dorothyann GibbsDeyette is a 30 year old currently separated Caucasian female who was [facility] to [facility] inpatient psychiatry for inpatient psychiatric hospitalization after endorsing suicidal thoughts. The patient told her friend she did not trust herself to be alone and was having constructive thoughts. She says she has been having suicidal thoughts on and off throughout most of her life that in the past one year they have become more intense and in the past one week, she started to have more feelings of hopelessness.   11/27 Patient reports feeling better.. Feels that medications have started to help. She is denying having suicidal ideation today. She has been is sleeping well and has been eating well. Denies having any side effects from medications. Or any physical complaints. She denies having any auditory or visual hallucinations.  11/28 patient reports doing well. She reports significant improvement in mood, appetite, energy and concentration. She denies suicidality, homicidality or having auditory or visual hallucinations. She is tolerating medications well. She took trazodone last night and felt somewhat overly sedated this morning. Other than that she is tolerating medications well. She denies any physical complaints. She has been participating actively in groups and finds them beneficial.  Per nursing:  D: Pt denies SI/HI/AVH. Pt is pleasant and cooperative, affect is flat, but brightens upon approach. Pt appears less anxious and she is interacting with peers and staff appropriately.  A: Pt was offered support and encouragement. Pt was given scheduled medications. Pt was encouraged to attend groups. Q 15 minute checks were done for safety.  R:Pt attends group and interacts well with peers and staff Pt is taking medication. Pt has no complaints.Pt  receptive to treatment and safety maintained on unit.  Principal Problem: Major depressive disorder, recurrent severe without psychotic features (HCC) Diagnosis:   Patient Active Problem List   Diagnosis Date Noted  . Major depressive disorder, recurrent severe without psychotic features (HCC) [F33.2] 02/02/2016  . Cannabis abuse [F12.10] 02/02/2016  . Morbid obesity (HCC) [E66.01] 12/15/2012   Total Time spent with patient: 30 minutes  Past Psychiatric History: The patient reports having struggled with intermittent passive suicidal thoughts throughout most of her life. She has been on Zoloft for the past 4 years prescribed to her by her PCP but is also tried multiple mood stabilizers including Lamictal and Depakote in the past. She also has had trials of Cymbalta and Prozac but says she could not tolerate either medication. She denies any prior suicide attempts. She is not currently seeing an outpatient psychiatrist. No history of any substance abuse treatment.  Past Medical History:  Past Medical History:  Diagnosis Date  . Anxiety   . Asthma    allergy related. rodent animals  . Bipolar 1 disorder (HCC)   . Depression   . Dizziness 07/03/2013  . Gestational diabetes    diet controlled  . H/O varicella   . Headache(784.0)   . HTN (hypertension)   . Kidney stones   . Obesity   . Pregnancy induced hypertension   . Proteinuria complicating pregnancy     Past Surgical History:  Procedure Laterality Date  . ibs    . TOOTH EXTRACTION     Family History:  Family History  Problem Relation Age of Onset  . Heart disease Father   . Heart attack Father   . Nephrolithiasis Father   . Colon polyps  Father   . Mental retardation Maternal Aunt   . Diabetes Maternal Grandmother   . Cancer Maternal Grandmother     ovarian and breast  . Diabetes Maternal Grandfather   . Diabetes Paternal Grandmother   . Hypertension Paternal Grandmother   . Heart disease Paternal Grandmother   .  Colon polyps Paternal Grandmother   . Diabetes Paternal Grandfather   . Colon polyps Paternal Uncle    Family Psychiatric  History: The patient reports that her mother struggles with schizophrenia, borderline personality disorder and PTSD   Social History: The patient was born and raised in the area but says her parents were divorced at an early age. She says her mom left when she was 89 years old. She describes her father as being solid. 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 She has been married for 7 years but separated from her husband for the past one month. She has 2 children, age 494 and 276. Currently her children are under the care of her husband. History  Alcohol Use No     History  Drug Use No    Social History   Social History  . Marital status: Married    Spouse name: N/A  . Number of children: 2  . Years of education: N/A   Occupational History  . hair stylist    Social History Main Topics  . Smoking status: Former Smoker    Types: Cigarettes    Quit date: 03/09/2005  . Smokeless tobacco: Never Used  . Alcohol use No  . Drug use: No  . Sexual activity: Yes   Other Topics Concern  . None   Social History Narrative  . None   Additional Social History:                         Sleep: Good  Appetite:  Good  Current Medications: Current Facility-Administered Medications  Medication Dose Route Frequency Provider Last Rate Last Dose  . acetaminophen (TYLENOL) tablet 650 mg  650 mg Oral Q6H PRN Darliss RidgelAarti K Kapur, MD      . albuterol (PROVENTIL HFA;VENTOLIN HFA) 108 (90 Base) MCG/ACT inhaler 1 puff  1 puff Inhalation Q6H PRN Darliss RidgelAarti K Kapur, MD      . alum & mag hydroxide-simeth (MAALOX/MYLANTA) 200-200-20 MG/5ML suspension 30 mL  30 mL Oral Q4H PRN Darliss RidgelAarti K Kapur, MD      . citalopram (CELEXA) tablet 10 mg  10 mg Oral Daily Darliss RidgelAarti K Kapur, MD   10 mg at 02/04/16 0937  . clotrimazole  (GYNE-LOTRIMIN) vaginal cream 1 Applicatorful  1 Applicatorful Vaginal QHS Jimmy FootmanAndrea Hernandez-Gonzalez, MD      . dicyclomine (BENTYL) tablet 20 mg  20 mg Oral TID AC Darliss RidgelAarti K Kapur, MD   20 mg at 02/04/16 1223  . hydrOXYzine (ATARAX/VISTARIL) tablet 25 mg  25 mg Oral Q6H PRN Darliss RidgelAarti K Kapur, MD   25 mg at 02/03/16 2243  . ibuprofen (ADVIL,MOTRIN) tablet 400 mg  400 mg Oral Q6H PRN Darliss RidgelAarti K Kapur, MD      . loperamide (IMODIUM) capsule 2 mg  2 mg Oral QID PRN Jimmy FootmanAndrea Hernandez-Gonzalez, MD   2 mg at 02/03/16 1711  . magnesium hydroxide (MILK OF MAGNESIA) suspension 30 mL  30 mL Oral Daily PRN Darliss RidgelAarti K Kapur, MD      . nicotine (NICODERM CQ - dosed in mg/24 hours) patch  21 mg  21 mg Transdermal Daily Darliss Ridgel, MD   21 mg at 02/04/16 0936  . traZODone (DESYREL) tablet 25 mg  25 mg Oral QHS PRN Jimmy Footman, MD      . Vitamin D (Ergocalciferol) (DRISDOL) capsule 50,000 Units  50,000 Units Oral Q7 days Darliss Ridgel, MD   50,000 Units at 02/02/16 1842    Lab Results:  No results found for this or any previous visit (from the past 48 hour(s)).  Blood Alcohol level:  Lab Results  Component Value Date   ETH <5 02/01/2016    Metabolic Disorder Labs: No results found for: HGBA1C, MPG No results found for: PROLACTIN Lab Results  Component Value Date   CHOL 171 07/03/2013   TRIG 162 (H) 07/03/2013   HDL 45 07/03/2013   LDLCALC 94 07/03/2013    Physical Findings: AIMS:  , ,  ,  ,    CIWA:    COWS:     Musculoskeletal: Strength & Muscle Tone: within normal limits Gait & Station: normal Patient leans: N/A  Psychiatric Specialty Exam: Physical Exam  Constitutional: She is oriented to person, place, and time. She appears well-developed.  HENT:  Head: Normocephalic and atraumatic.  Eyes: Conjunctivae and EOM are normal.  Neck: Normal range of motion.  Musculoskeletal: Normal range of motion.  Neurological: She is alert and oriented to person, place, and time.    Review of  Systems  Constitutional: Negative.   HENT: Negative.   Eyes: Negative.   Respiratory: Negative.   Cardiovascular: Negative.   Gastrointestinal: Negative.   Genitourinary: Negative.   Musculoskeletal: Negative.   Skin: Negative.   Neurological: Negative.   Endo/Heme/Allergies: Negative.   Psychiatric/Behavioral: Negative.     Blood pressure 117/71, pulse 86, temperature 98.4 F (36.9 C), temperature source Oral, resp. rate 18, height 5\' 10"  (1.778 m), weight 128.8 kg (284 lb), SpO2 98 %.Body mass index is 40.75 kg/m.  General Appearance: Well Groomed  Eye Contact:  Good  Speech:  Clear and Coherent  Volume:  Normal  Mood:  Dysphoric  Affect:  Congruent  Thought Process:  Linear and Descriptions of Associations: Intact  Orientation:  Full (Time, Place, and Person)  Thought Content:  Hallucinations: None  Suicidal Thoughts:  No  Homicidal Thoughts:  No  Memory:  Immediate;   Good Recent;   Good Remote;   Good  Judgement:  Fair  Insight:  Fair  Psychomotor Activity:  Decreased  Concentration:  Concentration: Good and Attention Span: Good  Recall:  Good  Fund of Knowledge:  Good  Language:  Good  Akathisia:  No  Handed:    AIMS (if indicated):     Assets:  Manufacturing systems engineer Physical Health Social Support  ADL's:  Intact  Cognition:  WNL  Sleep:  Number of Hours: 6.5     Treatment Plan Summary:  Slowly improving tolerating medications.  Major depressive disorder R/O Borderline Personality Disorder Cannabis use disorder, severe Obesity Prediabetes Moderate: marital problems   Ms. Deyette is a 30 year old currently separated Caucasian female with major depressive disorder and  Borderline Personality disorder who was transferred from [facility] emergency room for inpatient psychiatry hospitalization for medication management, safety and stabilization after endorsing suicidal thoughts with no specific plan in the context of separating from her husband  recently.  Major depressive disorder It was recommended in the [facility] emergency room that she start Celexa 10 mg by mouth daily for anxiety and depressive symptoms and trazodone 50  mg by mouth nightly for insomnia. The patient has had a history of being on mood stabilizers in the past including Depakote and Lamictal which she cannot tolerate. If she does have borderline personality traits, she would benefit more from individual therapy.   Cannabis use disorder, severe: The patient does smoke marijuana on a daily basis. She was advised to abstain from marijuana and all illicit drugs as they may worsen mood symptoms.  Obesity and elevated BMI: The patient was encouraged exercise and regular basis for at least 30 minutes, 3-5 times a week and to monitor her diet low in carbohydrates and fat.  Nicotine use disorder: Will offer an NicoDerm patch  Disposition: The patient has a stable living situation in [place]. She will need psychotropic medication management follow-up appointment with the psychiatrist that she has been getting medications from her PCP.  Possible discharge in 24-48 hours  Patient is improving. She is tolerating medications well. No changes other than decrease in the dose of trazodone to 25 mg.  Jimmy Footman, MD 02/04/2016, 2:47 PM

## 2016-02-04 NOTE — [facility] Group Notes (Signed)
[facility] LCSW Group Therapy Note  Date/Time:02/04/2016, 9:30am  Type of Therapy/Topic:  Group Therapy:  Feelings about Diagnosis  Participation Level:  Active  Mood: Reports good mood   Description of Group:    This group will allow patients to explore their thoughts and feelings about diagnoses they have received. Patients will be guided to explore their level of understanding and acceptance of these diagnoses. Facilitator will encourage patients to process their thoughts and feelings about the reactions of others to their diagnosis, and will guide patients in identifying ways to discuss their diagnosis with significant others in their lives. This group will be process-oriented, with patients participating in exploration of their own experiences as well as giving and receiving support and challenge from other group members.   Therapeutic Goals: 1. Patient will demonstrate understanding of diagnosis as evidence by identifying two or more symptoms of the disorder:  2. Patient will be able to express two feelings regarding the diagnosis 3. Patient will demonstrate ability to communicate their needs through discussion and/or role plays  Summary of Patient Progress:  Pt able to meet therapeutic goals.  Pt verbalizes that she is very aware of what she needs to be well and to remain well once discharged.    Therapeutic Modalities:   Cognitive Behavioral Therapy Brief Therapy Feelings Identification   Jake SharkSara Azaliah Carrero, MSW, LCSW

## 2016-02-05 NOTE — Progress Notes (Signed)
D: Pt denies SI/HI/AVH. Pt is pleasant and cooperative, affect is flat, but brightens upon approach. Pt appears less anxious and she is interacting with peers and staff appropriately.  A: Pt was offered support and encouragement. Pt was given scheduled medications. Pt was encouraged to attend groups. Q 15 minute checks were done for safety.  R:Pt attends group and interacts well with peers and staff Pt is taking medication. Pt has no complaints.Pt receptive to treatment and safety maintained on unit.   

## 2016-02-05 NOTE — Tx Team (Signed)
Interdisciplinary Treatment and Diagnostic Plan Update  02/05/2016 Time of Session: 10:30 AM CERINA LEARY MRN: [id number]  Principal Diagnosis: Major depressive disorder, recurrent severe without psychotic features (HCC)  Secondary Diagnoses: Principal Problem:   Major depressive disorder, recurrent severe without psychotic features (HCC) Active Problems:   Morbid obesity (HCC)   Cannabis abuse   Current Medications:  Current Facility-Administered Medications  Medication Dose Route Frequency Provider Last Rate Last Dose  . acetaminophen (TYLENOL) tablet 650 mg  650 mg Oral Q6H PRN Darliss Ridgel, MD      . albuterol (PROVENTIL HFA;VENTOLIN HFA) 108 (90 Base) MCG/ACT inhaler 1 puff  1 puff Inhalation Q6H PRN Darliss Ridgel, MD      . alum & mag hydroxide-simeth (MAALOX/MYLANTA) 200-200-20 MG/5ML suspension 30 mL  30 mL Oral Q4H PRN Darliss Ridgel, MD      . citalopram (CELEXA) tablet 10 mg  10 mg Oral Daily Darliss Ridgel, MD   10 mg at 02/05/16 0855  . clotrimazole (GYNE-LOTRIMIN) vaginal cream 1 Applicatorful  1 Applicatorful Vaginal QHS Jimmy Footman, MD   1 Applicatorful at 02/04/16 2045  . dicyclomine (BENTYL) tablet 20 mg  20 mg Oral TID AC Darliss Ridgel, MD   20 mg at 02/05/16 0855  . hydrOXYzine (ATARAX/VISTARIL) tablet 25 mg  25 mg Oral Q6H PRN Darliss Ridgel, MD   25 mg at 02/04/16 2044  . ibuprofen (ADVIL,MOTRIN) tablet 400 mg  400 mg Oral Q6H PRN Darliss Ridgel, MD   400 mg at 02/04/16 2044  . loperamide (IMODIUM) capsule 2 mg  2 mg Oral QID PRN Jimmy Footman, MD   2 mg at 02/03/16 1711  . magnesium hydroxide (MILK OF MAGNESIA) suspension 30 mL  30 mL Oral Daily PRN Darliss Ridgel, MD      . nicotine (NICODERM CQ - dosed in mg/24 hours) patch 21 mg  21 mg Transdermal Daily Darliss Ridgel, MD   21 mg at 02/05/16 0855  . traZODone (DESYREL) tablet 25 mg  25 mg Oral QHS PRN Jimmy Footman, MD   25 mg at 02/04/16 2044  . Vitamin D (Ergocalciferol)  (DRISDOL) capsule 50,000 Units  50,000 Units Oral Q7 days Darliss Ridgel, MD   50,000 Units at 02/02/16 1842   PTA Medications: Prescriptions Prior to Admission  Medication Sig Dispense Refill Last Dose  . albuterol (PROVENTIL HFA;VENTOLIN HFA) 108 (90 BASE) MCG/ACT inhaler Inhale 1 puff into the lungs every 6 (six) hours as needed for wheezing or shortness of breath.   Past Month at Unknown time  . amitriptyline (ELAVIL) 10 MG tablet Take 20 mg by mouth at bedtime.   5 2 weeks  . clonazePAM (KLONOPIN) 0.5 MG tablet TAKE 1 TABLET BY MOUTH TWICE A DAY AS NEEDED (Patient taking differently: TAKE 1 TABLET BY MOUTH TWICE A DAY AS NEEDED for anxiety) 30 tablet 1 2 weeks  . dicyclomine (BENTYL) 10 MG capsule Take 1 capsule (10 mg total) by mouth 3 (three) times daily. (Patient not taking: Reported on 02/01/2016) 90 capsule 3 Not Taking at Unknown time  . dicyclomine (BENTYL) 20 MG tablet Take 1 tablet (20 mg total) by mouth 3 (three) times daily before meals. 90 tablet 11 Past Month at Unknown time  . ibuprofen (ADVIL,MOTRIN) 200 MG tablet Take 400 mg by mouth every 6 (six) hours as needed for headache.   01/31/2016 at Unknown time  . PARAGARD INTRAUTERINE COPPER IUD IUD 1 each by Intrauterine route  once.   02/01/2016 at Unknown time  . sertraline (ZOLOFT) 100 MG tablet Take 150 mg by mouth daily.    2 weeks  . Vitamin D, Ergocalciferol, (DRISDOL) 50000 units CAPS capsule Take 50,000 Units by mouth every 7 (seven) days.   0 unknown    Patient Stressors: Marital or family conflict Medication change or noncompliance Substance abuse  Patient Strengths: Average or above average intelligence Capable of independent living Communication skills Work skills  Treatment Modalities: Medication Management, Group therapy, Case management,  1 to 1 session with clinician, Psychoeducation, Recreational therapy.   Physician Treatment Plan for Primary Diagnosis: Major depressive disorder, recurrent severe without  psychotic features (HCC) Long Term Goal(s): Improvement in symptoms so as ready for discharge Improvement in symptoms so as ready for discharge   Short Term Goals: Ability to identify changes in lifestyle to reduce recurrence of condition will improve Ability to verbalize feelings will improve Ability to disclose and discuss suicidal ideas Ability to demonstrate self-control will improve Ability to identify and develop effective coping behaviors will improve Compliance with prescribed medications will improve Abstain from Marijuana  Medication Management: Evaluate patient's response, side effects, and tolerance of medication regimen.  Therapeutic Interventions: 1 to 1 sessions, Unit Group sessions and Medication administration.  Evaluation of Outcomes: Progressing  Physician Treatment Plan for Secondary Diagnosis: Principal Problem:   Major depressive disorder, recurrent severe without psychotic features (HCC) Active Problems:   Morbid obesity (HCC)   Cannabis abuse  Long Term Goal(s): Improvement in symptoms so as ready for discharge Improvement in symptoms so as ready for discharge   Short Term Goals: Ability to identify changes in lifestyle to reduce recurrence of condition will improve Ability to verbalize feelings will improve Ability to disclose and discuss suicidal ideas Ability to demonstrate self-control will improve Ability to identify and develop effective coping behaviors will improve Compliance with prescribed medications will improve Abstain from Marijuana     Medication Management: Evaluate patient's response, side effects, and tolerance of medication regimen.  Therapeutic Interventions: 1 to 1 sessions, Unit Group sessions and Medication administration.  Evaluation of Outcomes: Progressing   RN Treatment Plan for Primary Diagnosis: Major depressive disorder, recurrent severe without psychotic features (HCC) Long Term Goal(s): Knowledge of disease and  therapeutic regimen to maintain health will improve  Short Term Goals: Ability to remain free from injury will improve, Ability to verbalize feelings will improve, Ability to disclose and discuss suicidal ideas and Compliance with prescribed medications will improve  Medication Management: RN will administer medications as ordered by provider, will assess and evaluate patient's response and provide education to patient for prescribed medication. RN will report any adverse and/or side effects to prescribing provider.  Therapeutic Interventions: 1 on 1 counseling sessions, Psychoeducation, Medication administration, Evaluate responses to treatment, Monitor vital signs and CBGs as ordered, Perform/monitor CIWA, COWS, AIMS and Fall Risk screenings as ordered, Perform wound care treatments as ordered.  Evaluation of Outcomes: Progressing   LCSW Treatment Plan for Primary Diagnosis: Major depressive disorder, recurrent severe without psychotic features (HCC) Long Term Goal(s): Safe transition to appropriate next level of care at discharge, Engage patient in therapeutic group addressing interpersonal concerns.  Short Term Goals: Engage patient in aftercare planning with referrals and resources, Increase social support, Increase emotional regulation and Increase skills for wellness and recovery  Therapeutic Interventions: Assess for all discharge needs, 1 to 1 time with Social worker, Explore available resources and support systems, Assess for adequacy in community support network, Educate family  and significant other(s) on suicide prevention, Complete Psychosocial Assessment, Interpersonal group therapy.  Evaluation of Outcomes: Progressing   Progress in Treatment: Attending groups: Yes. Participating in groups: Yes. Taking medication as prescribed: Yes. Toleration medication: Yes. Family/Significant other contact made: Yes, individual(s) contacted:  grandmother was contacted. Patient understands  diagnosis: Yes. Discussing patient identified problems/goals with staff: Yes. Medical problems stabilized or resolved: Yes. Denies suicidal/homicidal ideation: Yes. Issues/concerns per patient self-inventory: No.   New problem(s) identified: No, Describe:  None identified.  New Short Term/Long Term Goal(s):  Discharge Plan or Barriers:   Reason for Continuation of Hospitalization: Anxiety Depression  Estimated Length of Stay: D/C 02/06/2016  Attendees: Patient: Tresa GarterKellie Deyette  02/05/2016 11:01 AM  Physician: Dr. Radene JourneyAndrea Hernandez, MD 02/05/2016 11:01 AM  Nursing: Leonia ReaderPhyllis Cobb, BSN, RN 02/05/2016 11:01 AM  RN Care Manager: Philbert Riserori Terry, RN, BSN 02/05/2016 11:01 AM  Social Worker: Hampton AbbotKadijah Cindi Ghazarian, MSW, LCSW-A 02/05/2016 11:01 AM  Recreational Therapist: Hershal CoriaBeth Greene, LRT, CTRS  02/05/2016 11:01 AM   Scribe for Treatment Team: Lynden OxfordKadijah R Draylon Mercadel, LCSWA 02/05/2016 11:01 AM

## 2016-02-05 NOTE — Progress Notes (Signed)
  [facility] :  Will you be returning to the same living situation after discharge:  Yes,  home with husband. At discharge, do you have transportation home?: Yes,  grandmother pick pt up. Do you have the ability to pay for your medications: Yes,  BCBS  Release of information consent forms completed and in the chart;  Patient's signature needed at discharge.  Patient to Follow up at: Follow-up Information    [facility]. Go on 02/18/2016.   Why:  Please plan to meet with your therapist, Lavada MesiBeth McKenzie on December 12th at 12 o'clock noon. Please bring your discharge paperwork and insurance card to this appointment. Arrive 15 minutes early for paperwork. Contact information: Address: [address] [place], [place] [address] Phone: [phone](336) [phone] Fax: [phone]       [facility]. Go on 03/12/2016.   Why:  Please plan to meet with your psychiatrist, Dr. Peggyann ShoalsArseen on Jan. 4th at [facility]9AM for medication management. It is important to bring your discharge paperwork to this appointment. Arrive 15 minutes early.If you have any questions, contact [facility]. Contact information: Address: [address] [place], [place] [address] Phone: [phone](336) [phone] Fax: [phone]       [facility]. Go on 02/06/2016.   Why:  In case an emergency arises, please know that [facility] has a walk-in clinic for crises. The walk-in clinic hours are M-F 9AM-3PM. Please be sure to bring your discharge paperwork to this appointment if neccessary.  Contact information: Address: [address] [place], [place] [address] Phone: [phone] Fax: [phone]          Next level of care provider has access to [facility] Link:no  Safety Planning and Suicide Prevention discussed: Yes,  SPE completed with patient and grandmother.  Have you used any form of tobacco in the last 30 days? (Cigarettes, Smokeless Tobacco, Cigars, and/or  Pipes): Yes  Has patient been referred to the Quitline?: N/A patient is not a smoker  Patient has been referred for addiction treatment: Pt. refused referral  Lynden OxfordKadijah R Leonie Amacher, MSW, LCSW-A 02/05/2016, 3:45 PM

## 2016-02-05 NOTE — [facility] Group Notes (Signed)
[facility] LCSW Group Therapy  02/05/2016 11:35 AM  Type of Therapy:  Group Therapy  Participation Level:  Active  Participation Quality:  Appropriate and Sharing  Affect:  Appropriate  Cognitive:  Alert  Insight:  Engaged  Engagement in Therapy:  Engaged  Modes of Intervention:  Activity, Discussion, Education and Support  Summary of Progress/Problems: Emotional Regulation: Patients will identify both negative and positive emotions. They will discuss emotions they have difficulty regulating and how they impact their lives. Patients will be asked to identify healthy coping skills to combat unhealthy reactions to negative emotions. Patient actively shared in group and offered insight about communication and how she could improve communication with her family. Patient reports she is in a good mood.    Shanard Treto G. Garnette CzechSampson MSW, LCSWA 02/05/2016, 11:40 AM

## 2016-02-05 NOTE — Plan of Care (Signed)
Problem: Coping: Goal: Ability to verbalize feelings will improve Outcome: Progressing Patient verbalized feelings to staff.    

## 2016-02-05 NOTE — Plan of Care (Signed)
Problem: [facility] Participation in Recreation Therapeutic Interventions Goal: STG-Patient will demonstrate improved self esteem by identif STG: Self-Esteem - Within 4 treatment sessions, patient will verbalize at least 5 positive affirmation statements in each of 2 treatment sessions to increase self-esteem post d/c.  Outcome: Completed/Met Date Met: 02/05/16 Treatment Session 2; Completed 2 out of 2: At approximately 9:00 am, LRT met with patient in craft room. Patient verbalized 5 positive affirmation statements. Patient reported it felt "really good". LRT encouraged patient to continue saying positive affirmation statements.  Leonette [facility], LRT/CTRS 11.29.17 3:35 pm Goal: STG-Other Recreation Therapy Goal (Specify) STG: Stress Management - Within 4 treatment sessions, patient will verbalize understanding of the stress management techniques in each of 2 treatment sessions to increase stress management skills post d/c.  Outcome: Completed/Met Date Met: 02/05/16 Treatment Session 2; Completed 2 out of 2: At approximately 9:00 am, LRT met with patient in craft room. Patient reported she practiced one of the stress management techniques and it was helpful. LRT encouraged patient to continue practicing the techniques.  Leonette [facility], LRT/CTRS 11.29.17 3:37 pm

## 2016-02-05 NOTE — [facility] Group Notes (Signed)
[facility] Group Notes:  (Nursing/MHT/Case Management/Adjunct)  Date:  02/05/2016  Time:  3:58 AM  Type of Therapy:  Group Therapy  Participation Level:  Did Not Attend    Veva Holesshley Imani Starlynn Klinkner 02/05/2016, 3:58 AM

## 2016-02-05 NOTE — Progress Notes (Signed)
Patient discharged home. DC instructions provided and explained. Medications reviewed. Rx given. All questions answered. Pt stable at discharged. Denies SI, HI,AVH.  Belongings returned.

## 2016-02-05 NOTE — Progress Notes (Signed)
Recreation Therapy Notes  INPATIENT RECREATION TR PLAN  Patient Details Name: Amber Nielsen MRN: [id number] DOB: September 18, 1985 Today's Date: 02/05/2016  Rec Therapy Plan Is patient appropriate for Therapeutic Recreation?: Yes Treatment times per week: At least once a week TR Treatment/Interventions: 1:1 session, Group participation (Comment) (Appropriate participation in daily recreational therapy tx)  Discharge Criteria Pt will be discharged from therapy if:: Treatment goals are met, Discharged Treatment plan/goals/alternatives discussed and agreed upon by:: Patient/family  Discharge Summary Short term goals set: See Care Plan Short term goals met: Complete Progress toward goals comments: One-to-one attended One-to-one attended: Self-esteem, stress management Reason goals not met: N/A Therapeutic equipment acquired: None Reason patient discharged from therapy: Discharge from hospital Pt/family agrees with progress & goals achieved: Yes Date patient discharged from therapy: 02/05/16   [facility], LRT/CTRS 02/05/2016, 3:38 PM

## 2016-02-18 ENCOUNTER — Ambulatory Visit (HOSPITAL_COMMUNITY): Payer: Self-pay | Admitting: Licensed Clinical Social Worker

## 2016-03-03 ENCOUNTER — Emergency Department (HOSPITAL_COMMUNITY)
Admission: EM | Admit: 2016-03-03 | Discharge: 2016-03-03 | Disposition: A | Discharge status: Home / Self Care | Payer: BLUE CROSS/BLUE SHIELD | Attending: Emergency Medicine | Admitting: Emergency Medicine

## 2016-03-03 ENCOUNTER — Encounter (HOSPITAL_COMMUNITY): Payer: Self-pay | Admitting: Vascular Surgery

## 2016-03-03 DIAGNOSIS — F329 Major depressive disorder, single episode, unspecified: Secondary | ICD-10-CM | POA: Insufficient documentation

## 2016-03-03 DIAGNOSIS — Z5321 Procedure and treatment not carried out due to patient leaving prior to being seen by health care provider: Secondary | ICD-10-CM | POA: Insufficient documentation

## 2016-03-03 LAB — COMPREHENSIVE METABOLIC PANEL
ALT: 84 U/L — ABNORMAL HIGH (ref 14–54)
AST: 39 U/L (ref 15–41)
Albumin: 3.5 g/dL (ref 3.5–5.0)
Alkaline Phosphatase: 58 U/L (ref 38–126)
Anion gap: 10 (ref 5–15)
BUN: 6 mg/dL (ref 6–20)
CO2: 23 mmol/L (ref 22–32)
Calcium: 9.4 mg/dL (ref 8.9–10.3)
Chloride: 106 mmol/L (ref 101–111)
Creatinine, Ser: 0.79 mg/dL (ref 0.44–1.00)
GFR calc Af Amer: >60 mL/min (ref >60)
GFR calc non Af Amer: >60 mL/min (ref >60)
Glucose, Bld: 138 mg/dL — ABNORMAL HIGH (ref 65–99)
Potassium: 3.6 mmol/L (ref 3.5–5.1)
Sodium: 139 mmol/L (ref 135–145)
Total Bilirubin: 0.5 mg/dL (ref 0.3–1.2)
Total Protein: 7.4 g/dL (ref 6.5–8.1)

## 2016-03-03 LAB — CBC
HCT: 41.8 % (ref 36.0–46.0)
Hemoglobin: 13.6 g/dL (ref 12.0–15.0)
MCH: 29.4 pg (ref 26.0–34.0)
MCHC: 32.5 g/dL (ref 30.0–36.0)
MCV: 90.3 fL (ref 78.0–100.0)
Platelets: 402 10*3/uL — ABNORMAL HIGH (ref 150–400)
RBC: 4.63 MIL/uL (ref 3.87–5.11)
RDW: 13.4 % (ref 11.5–15.5)
WBC: 10 10*3/uL (ref 4.0–10.5)

## 2016-03-03 LAB — I-STAT BETA HCG BLOOD, ED (MC, WL, AP ONLY): I-stat hCG, quantitative: <5 m[IU]/mL (ref <5)

## 2016-03-03 LAB — ETHANOL: Alcohol, Ethyl (B): <5 mg/dL (ref <5)

## 2016-03-03 NOTE — ED Notes (Signed)
Want to wait until she gets in a room to put on her paper scrubs.

## 2016-03-03 NOTE — ED Triage Notes (Signed)
Pt reports to the ED for eval of increased depression and anxiety. Pt is here with her counselor. She was seen on Nov 27th for these symptoms and she was placed on several medications but she states that she didn't like how they make her feel but she states she has been compliant with them unless she forgets. She did have a recent substance abuse (1 g of Molly and 0.5 of a joint of THC). Pt denies any SI/HI, AVH, or ETOH abuse.

## 2016-03-03 NOTE — ED Notes (Signed)
Called for reassessment x 1, no answer.

## 2016-03-03 NOTE — ED Notes (Signed)
Called patient to place her in a room. No answer x 2 in the waiting room or triage area.

## 2016-03-03 NOTE — ED Notes (Signed)
Pt called to reviatlize. No answer.

## 2016-03-12 ENCOUNTER — Ambulatory Visit (HOSPITAL_COMMUNITY): Payer: Self-pay | Admitting: Psychiatry

## 2016-04-01 ENCOUNTER — Encounter (HOSPITAL_COMMUNITY): Payer: Self-pay | Admitting: *Deleted

## 2016-04-01 ENCOUNTER — Inpatient Hospital Stay (HOSPITAL_COMMUNITY)
Admission: AD | Admit: 2016-04-01 | Discharge: 2016-04-01 | Discharge status: Against Medical Advice | Payer: BLUE CROSS/BLUE SHIELD | Source: Ambulatory Visit | Attending: Obstetrics & Gynecology | Admitting: Obstetrics & Gynecology

## 2016-04-01 DIAGNOSIS — R102 Pelvic and perineal pain: Secondary | ICD-10-CM | POA: Insufficient documentation

## 2016-04-01 DIAGNOSIS — M545 Low back pain: Secondary | ICD-10-CM | POA: Insufficient documentation

## 2016-04-01 LAB — URINALYSIS, ROUTINE W REFLEX MICROSCOPIC
Bacteria, UA: NONE SEEN
Bilirubin Urine: NEGATIVE
Glucose, UA: NEGATIVE mg/dL
Hgb urine dipstick: NEGATIVE
Ketones, ur: NEGATIVE mg/dL
Nitrite: NEGATIVE
Protein, ur: NEGATIVE mg/dL
Specific Gravity, Urine: 1.016 (ref 1.005–1.030)
pH: 5 (ref 5.0–8.0)

## 2016-04-01 LAB — POCT PREGNANCY, URINE: Preg Test, Ur: NEGATIVE

## 2016-04-01 NOTE — MAU Note (Signed)
Registration staff called to say that pt left.

## 2016-04-01 NOTE — MAU Note (Signed)
Not in lobby

## 2016-04-01 NOTE — MAU Note (Signed)
For the past week has been having back , left sided pelvic pain, and bad cramping.  Doesn't know what is causing it. There is a chance that she could be preg.  Has been having nausea, esp when first wakes up. Has been constipated

## 2016-04-02 ENCOUNTER — Inpatient Hospital Stay (HOSPITAL_COMMUNITY)
Admission: AD | Admit: 2016-04-02 | Discharge: 2016-04-02 | Disposition: A | Discharge status: Home / Self Care | Payer: BLUE CROSS/BLUE SHIELD | Source: Ambulatory Visit | Attending: Obstetrics & Gynecology | Admitting: Obstetrics & Gynecology

## 2016-04-02 ENCOUNTER — Encounter (HOSPITAL_COMMUNITY): Payer: Self-pay | Admitting: *Deleted

## 2016-04-02 DIAGNOSIS — R1032 Left lower quadrant pain: Secondary | ICD-10-CM | POA: Insufficient documentation

## 2016-04-02 DIAGNOSIS — R102 Pelvic and perineal pain: Secondary | ICD-10-CM | POA: Diagnosis not present

## 2016-04-02 DIAGNOSIS — Z87442 Personal history of urinary calculi: Secondary | ICD-10-CM | POA: Insufficient documentation

## 2016-04-02 DIAGNOSIS — E669 Obesity, unspecified: Secondary | ICD-10-CM | POA: Insufficient documentation

## 2016-04-02 DIAGNOSIS — M5442 Lumbago with sciatica, left side: Secondary | ICD-10-CM

## 2016-04-02 DIAGNOSIS — Z6841 Body Mass Index (BMI) 40.0 and over, adult: Secondary | ICD-10-CM | POA: Insufficient documentation

## 2016-04-02 DIAGNOSIS — K589 Irritable bowel syndrome without diarrhea: Secondary | ICD-10-CM | POA: Insufficient documentation

## 2016-04-02 DIAGNOSIS — J45909 Unspecified asthma, uncomplicated: Secondary | ICD-10-CM | POA: Insufficient documentation

## 2016-04-02 DIAGNOSIS — M5441 Lumbago with sciatica, right side: Secondary | ICD-10-CM

## 2016-04-02 DIAGNOSIS — F419 Anxiety disorder, unspecified: Secondary | ICD-10-CM | POA: Insufficient documentation

## 2016-04-02 DIAGNOSIS — F17210 Nicotine dependence, cigarettes, uncomplicated: Secondary | ICD-10-CM | POA: Insufficient documentation

## 2016-04-02 DIAGNOSIS — I10 Essential (primary) hypertension: Secondary | ICD-10-CM | POA: Insufficient documentation

## 2016-04-02 DIAGNOSIS — N83209 Unspecified ovarian cyst, unspecified side: Secondary | ICD-10-CM | POA: Insufficient documentation

## 2016-04-02 DIAGNOSIS — M5440 Lumbago with sciatica, unspecified side: Secondary | ICD-10-CM | POA: Insufficient documentation

## 2016-04-02 DIAGNOSIS — F319 Bipolar disorder, unspecified: Secondary | ICD-10-CM | POA: Insufficient documentation

## 2016-04-02 DIAGNOSIS — G8929 Other chronic pain: Secondary | ICD-10-CM

## 2016-04-02 HISTORY — DX: Irritable bowel syndrome, unspecified: K58.9

## 2016-04-02 LAB — CBC WITH DIFFERENTIAL/PLATELET
Basophils Absolute: 0 10*3/uL (ref 0.0–0.1)
Basophils Relative: 0 %
Eosinophils Absolute: 0.1 10*3/uL (ref 0.0–0.7)
Eosinophils Relative: 1 %
HCT: 37.3 % (ref 36.0–46.0)
Hemoglobin: 12.2 g/dL (ref 12.0–15.0)
Lymphocytes Relative: 20 %
Lymphs Abs: 2.2 10*3/uL (ref 0.7–4.0)
MCH: 29.5 pg (ref 26.0–34.0)
MCHC: 32.7 g/dL (ref 30.0–36.0)
MCV: 90.1 fL (ref 78.0–100.0)
Monocytes Absolute: 0.4 10*3/uL (ref 0.1–1.0)
Monocytes Relative: 4 %
Neutro Abs: 8.3 10*3/uL — ABNORMAL HIGH (ref 1.7–7.7)
Neutrophils Relative %: 75 %
Platelets: 324 10*3/uL (ref 150–400)
RBC: 4.14 MIL/uL (ref 3.87–5.11)
RDW: 14 % (ref 11.5–15.5)
WBC: 11 10*3/uL — ABNORMAL HIGH (ref 4.0–10.5)

## 2016-04-02 MED ORDER — KETOROLAC TROMETHAMINE 60 MG/2ML IM SOLN
60.0000 mg | Freq: Once | INTRAMUSCULAR | Status: AC
  Administered 2016-04-02: 60 mg via INTRAMUSCULAR
  Filled 2016-04-02: qty 2

## 2016-04-02 MED ORDER — ONDANSETRON HCL 4 MG PO TABS
4.0000 mg | ORAL_TABLET | Freq: Once | ORAL | Status: AC
  Administered 2016-04-02: 4 mg via ORAL
  Filled 2016-04-02: qty 1

## 2016-04-02 NOTE — MAU Note (Signed)
Pt reports she has had lower back pain that radiates towards her abd. Pain in her ovaries Pain for a few weeks. Pt was here yesterday but left before she was evaluated.

## 2016-04-02 NOTE — MAU Note (Signed)
C/o mid-lower back pain for past week;pain radiates from midback to lower L abdominal area; pain during sex;

## 2016-04-02 NOTE — Discharge Instructions (Signed)
Pelvic Pain, Female Pelvic pain is pain felt below the belly button and between your hips. It can be caused by many different things. It is important to get help right away. This is especially true for severe, sharp, or unusual pain that comes on suddenly.  HOME CARE  Only take medicine as told by your doctor.  Rest as told by your doctor.  Eat a healthy diet, such as fruits, vegetables, and lean meats.  Drink enough fluids to keep your pee (urine) clear or pale yellow, or as told.  Avoid sex (intercourse) if it causes pain.  Apply warm or cold packs to your lower belly (abdomen). Use the type of pack that helps the pain.  Avoid situations that cause you stress.  Keep a journal to track your pain. Write down:  When the pain started.  Where it is located.  If there are things that seem to be related to the pain, such as food or your period.  Follow up with your doctor as told. GET HELP RIGHT AWAY IF:   You have heavy bleeding from the vagina.  You have more pelvic pain.  You feel lightheaded or pass out (faint).  You have chills.  You have pain when you pee or have blood in your pee.  You cannot stop having watery poop (diarrhea).  You cannot stop throwing up (vomiting).  You have a fever or lasting symptoms for more than 3 days.  You have a fever and your symptoms suddenly get worse.  You are being physically or sexually abused.  Your medicine does not help your pain.  You have fluid (discharge) coming from your vagina that is not normal. MAKE SURE YOU:  Understand these instructions.  Will watch your condition.  Will get help if you are not doing well or get worse. This information is not intended to replace advice given to you by your health care provider. Make sure you discuss any questions you have with your health care provider. Document Released: 08/12/2007 Document Revised: 03/16/2014 Document Reviewed: 12/14/2014 Elsevier Interactive Patient  Education  2017 Elsevier Inc. Back Pain, Adult Back pain is very common in adults.The cause of back pain is rarely dangerous and the pain often gets better over time.The cause of your back pain may not be known. Some common causes of back pain include:  Strain of the muscles or ligaments supporting the spine.  Wear and tear (degeneration) of the spinal disks.  Arthritis.  Direct injury to the back. For many people, back pain may return. Since back pain is rarely dangerous, most people can learn to manage this condition on their own. Follow these instructions at home: Watch your back pain for any changes. The following actions may help to lessen any discomfort you are feeling:  Remain active. It is stressful on your back to sit or stand in one place for long periods of time. Do not sit, drive, or stand in one place for more than 30 minutes at a time. Take short walks on even surfaces as soon as you are able.Try to increase the length of time you walk each day.  Exercise regularly as directed by your health care provider. Exercise helps your back heal faster. It also helps avoid future injury by keeping your muscles strong and flexible.  Do not stay in bed.Resting more than 1-2 days can delay your recovery.  Pay attention to your body when you bend and lift. The most comfortable positions are those that put less stress  on your recovering back. Always use proper lifting techniques, including:  Bending your knees.  Keeping the load close to your body.  Avoiding twisting.  Find a comfortable position to sleep. Use a firm mattress and lie on your side with your knees slightly bent. If you lie on your back, put a pillow under your knees.  Avoid feeling anxious or stressed.Stress increases muscle tension and can worsen back pain.It is important to recognize when you are anxious or stressed and learn ways to manage it, such as with exercise.  Take medicines only as directed by your  health care provider. Over-the-counter medicines to reduce pain and inflammation are often the most helpful.Your health care provider may prescribe muscle relaxant drugs.These medicines help dull your pain so you can more quickly return to your normal activities and healthy exercise.  Apply ice to the injured area:  Put ice in a plastic bag.  Place a towel between your skin and the bag.  Leave the ice on for 20 minutes, 2-3 times a day for the first 2-3 days. After that, ice and heat may be alternated to reduce pain and spasms.  Maintain a healthy weight. Excess weight puts extra stress on your back and makes it difficult to maintain good posture. Contact a health care provider if:  You have pain that is not relieved with rest or medicine.  You have increasing pain going down into the legs or buttocks.  You have pain that does not improve in one week.  You have night pain.  You lose weight.  You have a fever or chills. Get help right away if:  You develop new bowel or bladder control problems.  You have unusual weakness or numbness in your arms or legs.  You develop nausea or vomiting.  You develop abdominal pain.  You feel faint. This information is not intended to replace advice given to you by your health care provider. Make sure you discuss any questions you have with your health care provider. Document Released: 02/23/2005 Document Revised: 07/04/2015 Document Reviewed: 06/27/2013 Elsevier Interactive Patient Education  2017 [employer].

## 2016-04-02 NOTE — MAU Provider Note (Signed)
Chief Complaint:  Back Pain   First Provider Initiated Contact with Patient 04/02/16 1321      HPI: Amber Nielsen is a 31 y.o. [id number] who presents to maternity admissions reporting low back pain which radiates to Left lower quadrant. Has had this pain for 4 days. Office could not see her today.  Does have an appt tomorrow with Dr Billy Coastaavon.  . She reports vaginal bleeding, vaginal itching/burning, urinary symptoms, h/a, dizziness, n/v, or fever/chills.    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  Has not had any recent diarrhea or constipation.  No irregular bleeding. Pain is worse with intercourse. States it kind of feels like kidney stone.  Back Pain  This is a new problem. The current episode started in the past 7 days. The problem occurs intermittently. The problem has been waxing and waning since onset. The pain is present in the lumbar spine. The quality of the pain is described as cramping. Radiates to: LLQ. The pain is moderate. The pain is the same all the time. The symptoms are aggravated by twisting, sitting and lying down. Stiffness is present all day. Associated symptoms include abdominal pain. Pertinent negatives include no chest pain, dysuria, fever (did have flu 2-3 weeks ago), headaches, leg pain, paresis, paresthesias, tingling or weakness. She has tried nothing for the symptoms.   RN Note: C/o mid-lower back pain for past week;pain radiates from midback to lower L abdominal area; pain during sex;  Past Medical History: Past Medical History:  Diagnosis Date  . Anxiety   . Asthma    allergy related. rodent animals  . Bipolar 1 disorder (HCC)   . Bipolar 1 disorder (HCC)   . Depression   . Dizziness 07/03/2013  . Gestational diabetes    diet controlled  . H/O varicella   . Headache(784.0)   . HTN (hypertension)   . IBS (irritable bowel syndrome)   . Kidney stones   . Obesity   . Ovarian cyst   . Pregnancy induced hypertension   .  Proteinuria complicating pregnancy     Past obstetric history: OB History  Gravida Para Term Preterm AB Living  3 2 2   1 2   SAB TAB Ectopic Multiple Live Births  1       2    # Outcome Date GA Lbr Len/2nd Weight Sex Delivery Anes PTL Lv  3 Term 11/05/11 5756w1d -14:21 / 00:06 8 lb 9.7 oz (3.905 kg) F Vag-Spont None  LIV  2 Term 2011 5715w0d 06:00 10 lb 2 oz (4.593 kg) F Vag-Spont EPI  LIV  1 SAB 2010              Past Surgical History: Past Surgical History:  Procedure Laterality Date  . COLONOSCOPY    . ibs    . TOOTH EXTRACTION      Family History: Family History  Problem Relation Age of Onset  . Heart disease Father   . Heart attack Father   . Nephrolithiasis Father   . Colon polyps Father   . Mental retardation Maternal Aunt   . Diabetes Maternal Grandmother   . Cancer Maternal Grandmother     ovarian and breast  . Diabetes Maternal Grandfather   . Diabetes Paternal Grandmother   . Hypertension Paternal Grandmother   . Heart disease Paternal Grandmother   . Colon polyps Paternal Grandmother   . Diabetes Paternal Grandfather   . Colon polyps Paternal  Uncle     Social History: Social History  Substance Use Topics  . Smoking status: Current Every Day Smoker    Packs/day: 1.00    Types: Cigarettes    Last attempt to quit: 03/09/2005  . Smokeless tobacco: Current User  . Alcohol use No    Allergies:  Allergies  Allergen Reactions  . Celery Oil Swelling    Tongue swelling when eating celery  . Cymbalta [Duloxetine Hcl] Other (See Comments)    "out of body"  . Lamictal [Lamotrigine] Other (See Comments)    unknown  . Prozac [Fluoxetine Hcl] Other (See Comments)    Anger problems    Meds:  Prescriptions Prior to Admission  Medication Sig Dispense Refill Last Dose  . albuterol (PROVENTIL HFA;VENTOLIN HFA) 108 (90 BASE) MCG/ACT inhaler Inhale 1 puff into the lungs every 6 (six) hours as needed for wheezing or shortness of breath.   Past Week at Unknown  time  . citalopram (CELEXA) 20 MG tablet Take 20 mg by mouth daily.   04/01/2016 at Unknown time  . levonorgestrel-ethinyl estradiol (PORTIA-28) 0.15-30 MG-MCG tablet Take 1 tablet by mouth daily.   04/01/2016 at Unknown time  . traZODone (DESYREL) 50 MG tablet Take 0.5 tablets (25 mg total) by mouth at bedtime as needed for sleep. 30 tablet 0 04/01/2016 at Unknown time    I have reviewed patient's Past Medical Hx, Surgical Hx, Family Hx, Social Hx, medications and allergies.  ROS:  Review of Systems  Constitutional: Negative for fever (did have flu 2-3 weeks ago).  Cardiovascular: Negative for chest pain.  Gastrointestinal: Positive for abdominal pain.  Genitourinary: Negative for dysuria.  Musculoskeletal: Positive for back pain.  Neurological: Negative for tingling, weakness, headaches and paresthesias.   Other systems negative     Physical Exam  Patient Vitals for the past 24 hrs:  BP Temp Pulse Resp Height Weight  04/02/16 1142 114/74 98.4 F (36.9 C) 92 18 5\' 10"  (1.778 m) 289 lb 6.4 oz (131.3 kg)   Constitutional: Well-developed, well-nourished female in no acute distress.  Cardiovascular: normal rate and rhythm, no ectopy audible, S1 & S2 heard, no murmur Respiratory: normal effort, no distress. Lungs CTAB with no wheezes or crackles GI: Abd soft, tender over RLQ and LLQ with deep palpation.  Nondistended.  No rebound, No guarding.  Bowel Sounds audible   Negative Murphy sign.   MS: Extremities nontender, no edema, normal ROM Neurologic: Alert and oriented x 4.   Grossly nonfocal. GU: Neg CVAT. Skin:  Warm and Dry Psych:  Affect appropriate.  PELVIC EXAM: Cervix firm, anterior, neg CMT, uterus nontender, nonenlarged, adnexa without tenderness, enlargement, or mass    Labs: Results for orders placed or performed during the hospital encounter of 04/02/16 (from the past 24 hour(s))  CBC with Differential/Platelet     Status: Abnormal   Collection Time: 04/02/16  2:03 PM   Result Value Ref Range   WBC 11.0 (H) 4.0 - 10.5 K/uL   RBC 4.14 3.87 - 5.11 MIL/uL   Hemoglobin 12.2 12.0 - 15.0 g/dL   HCT 16.1 09.6 - 04.5 %   MCV 90.1 78.0 - 100.0 fL   MCH 29.5 26.0 - 34.0 pg   MCHC 32.7 30.0 - 36.0 g/dL   RDW 40.9 81.1 - 91.4 %   Platelets 324 150 - 400 K/uL   Neutrophils Relative % 75 %   Neutro Abs 8.3 (H) 1.7 - 7.7 K/uL   Lymphocytes Relative 20 %   Lymphs  Abs 2.2 0.7 - 4.0 K/uL   Monocytes Relative 4 %   Monocytes Absolute 0.4 0.1 - 1.0 K/uL   Eosinophils Relative 1 %   Eosinophils Absolute 0.1 0.0 - 0.7 K/uL   Basophils Relative 0 %   Basophils Absolute 0.0 0.0 - 0.1 K/uL    Ref. Range 04/01/2016 19:10  Appearance Latest Ref Range: CLEAR  CLEAR  Bacteria, UA Latest Ref Range: NONE SEEN  NONE SEEN  Bilirubin Urine Latest Ref Range: NEGATIVE  NEGATIVE  Color, Urine Latest Ref Range: YELLOW  STRAW (A)  Glucose Latest Ref Range: NEGATIVE mg/dL NEGATIVE  Hgb urine dipstick Latest Ref Range: NEGATIVE  NEGATIVE  Ketones, ur Latest Ref Range: NEGATIVE mg/dL NEGATIVE  Leukocytes, UA Latest Ref Range: NEGATIVE  SMALL (A)  Mucous Unknown PRESENT  Nitrite Latest Ref Range: NEGATIVE  NEGATIVE  pH Latest Ref Range: 5.0 - 8.0  5.0  Protein Latest Ref Range: NEGATIVE mg/dL NEGATIVE  RBC / HPF Latest Ref Range: 0 - 5 RBC/hpf 0-5  Specific Gravity, Urine Latest Ref Range: 1.005 - 1.030  1.016  Squamous Epithelial / LPF Latest Ref Range: NONE SEEN  6-30 (A)  WBC, UA Latest Ref Range: 0 - 5 WBC/hpf 0-5    Imaging:  No results found.  MAU Course/MDM: I have ordered labs as follows:  CBC/diff, UA, declines STD testing (recently had it) Imaging ordered: none Results reviewed.   Consult Dr Juliene Pina with presentation, exam findings and lab results.   Treatments in MAU included Toradol for pain, Zofran for nausea.   Pt stable at time of discharge. Patient requests note for work for last night and today  Assessment: Low back pain Bilateral pelvic pain, possibly  referred pain from back History of sciatica  Plan: Discharge home Recommend Follow up with chiropractor and Dr Billy Coast tomorrow as scheduled  Encouraged to return here or to other Urgent Care/ED if she develops worsening of symptoms, increase in pain, fever, or other concerning symptoms.   Wynelle Bourgeois CNM, MSN Certified Nurse-Midwife 04/02/2016 1:21 PM

## 2016-04-06 ENCOUNTER — Emergency Department (HOSPITAL_COMMUNITY)
Admission: EM | Admit: 2016-04-06 | Discharge: 2016-04-06 | Disposition: A | Discharge status: Home / Self Care | Payer: BLUE CROSS/BLUE SHIELD | Attending: Emergency Medicine | Admitting: Emergency Medicine

## 2016-04-06 ENCOUNTER — Ambulatory Visit (HOSPITAL_COMMUNITY): Payer: BLUE CROSS/BLUE SHIELD | Admitting: Psychiatry

## 2016-04-06 ENCOUNTER — Emergency Department (HOSPITAL_COMMUNITY): Payer: BLUE CROSS/BLUE SHIELD

## 2016-04-06 ENCOUNTER — Encounter (HOSPITAL_COMMUNITY): Payer: Self-pay | Admitting: Emergency Medicine

## 2016-04-06 DIAGNOSIS — J45909 Unspecified asthma, uncomplicated: Secondary | ICD-10-CM | POA: Insufficient documentation

## 2016-04-06 DIAGNOSIS — N1330 Unspecified hydronephrosis: Secondary | ICD-10-CM | POA: Insufficient documentation

## 2016-04-06 DIAGNOSIS — N200 Calculus of kidney: Secondary | ICD-10-CM

## 2016-04-06 DIAGNOSIS — I10 Essential (primary) hypertension: Secondary | ICD-10-CM | POA: Insufficient documentation

## 2016-04-06 DIAGNOSIS — Z79899 Other long term (current) drug therapy: Secondary | ICD-10-CM | POA: Insufficient documentation

## 2016-04-06 DIAGNOSIS — F17210 Nicotine dependence, cigarettes, uncomplicated: Secondary | ICD-10-CM | POA: Insufficient documentation

## 2016-04-06 LAB — COMPREHENSIVE METABOLIC PANEL
ALT: 51 U/L (ref 14–54)
AST: 46 U/L — ABNORMAL HIGH (ref 15–41)
Albumin: 3.8 g/dL (ref 3.5–5.0)
Alkaline Phosphatase: 72 U/L (ref 38–126)
Anion gap: 13 (ref 5–15)
BUN: 17 mg/dL (ref 6–20)
CO2: 21 mmol/L — ABNORMAL LOW (ref 22–32)
Calcium: 9.4 mg/dL (ref 8.9–10.3)
Chloride: 102 mmol/L (ref 101–111)
Creatinine, Ser: 1.01 mg/dL — ABNORMAL HIGH (ref 0.44–1.00)
GFR calc Af Amer: >60 mL/min (ref >60)
GFR calc non Af Amer: >60 mL/min (ref >60)
Glucose, Bld: 154 mg/dL — ABNORMAL HIGH (ref 65–99)
Potassium: 4.3 mmol/L (ref 3.5–5.1)
Sodium: 136 mmol/L (ref 135–145)
Total Bilirubin: 0.5 mg/dL (ref 0.3–1.2)
Total Protein: 7.7 g/dL (ref 6.5–8.1)

## 2016-04-06 LAB — CBC
HCT: 42.7 % (ref 36.0–46.0)
Hemoglobin: 14.3 g/dL (ref 12.0–15.0)
MCH: 30 pg (ref 26.0–34.0)
MCHC: 33.5 g/dL (ref 30.0–36.0)
MCV: 89.7 fL (ref 78.0–100.0)
Platelets: 392 10*3/uL (ref 150–400)
RBC: 4.76 MIL/uL (ref 3.87–5.11)
RDW: 13.5 % (ref 11.5–15.5)
WBC: 21.9 10*3/uL — ABNORMAL HIGH (ref 4.0–10.5)

## 2016-04-06 LAB — URINALYSIS, ROUTINE W REFLEX MICROSCOPIC
Bacteria, UA: NONE SEEN
Bilirubin Urine: NEGATIVE
Glucose, UA: 50 mg/dL — AB
Ketones, ur: 20 mg/dL — AB
Nitrite: NEGATIVE
Protein, ur: 30 mg/dL — AB
Specific Gravity, Urine: 1.033 — ABNORMAL HIGH (ref 1.005–1.030)
pH: 5 (ref 5.0–8.0)

## 2016-04-06 LAB — LIPASE, BLOOD: Lipase: 26 U/L (ref 11–51)

## 2016-04-06 LAB — POC URINE PREG, ED: Preg Test, Ur: NEGATIVE

## 2016-04-06 MED ORDER — MORPHINE SULFATE (PF) 4 MG/ML IV SOLN
4.0000 mg | Freq: Once | INTRAVENOUS | Status: AC
  Administered 2016-04-06: 4 mg via INTRAVENOUS
  Filled 2016-04-06: qty 1

## 2016-04-06 MED ORDER — SODIUM CHLORIDE 0.9 % IV BOLUS (SEPSIS)
1000.0000 mL | Freq: Once | INTRAVENOUS | Status: AC
  Administered 2016-04-06: 1000 mL via INTRAVENOUS

## 2016-04-06 MED ORDER — ONDANSETRON HCL 4 MG/2ML IJ SOLN
4.0000 mg | Freq: Once | INTRAMUSCULAR | Status: AC
  Administered 2016-04-06: 4 mg via INTRAVENOUS
  Filled 2016-04-06: qty 2

## 2016-04-06 MED ORDER — HYDROCODONE-ACETAMINOPHEN 5-325 MG PO TABS: 1.0000 | ORAL_TABLET | Freq: Four times a day (QID) | ORAL | 0 refills | Status: DC | PRN

## 2016-04-06 MED ORDER — NAPROXEN 500 MG PO TABS: 500.0000 mg | ORAL_TABLET | Freq: Two times a day (BID) | ORAL | 0 refills | Status: DC

## 2016-04-06 MED ORDER — IOPAMIDOL (ISOVUE-300) INJECTION 61%
INTRAVENOUS | Status: AC
  Administered 2016-04-06: 100 mL
  Filled 2016-04-06: qty 100

## 2016-04-06 MED ORDER — HYDROMORPHONE HCL 2 MG/ML IJ SOLN
1.0000 mg | Freq: Once | INTRAMUSCULAR | Status: AC
  Administered 2016-04-06: 1 mg via INTRAVENOUS
  Filled 2016-04-06: qty 1

## 2016-04-06 MED ORDER — ONDANSETRON 4 MG PO TBDP
ORAL_TABLET | ORAL | Status: AC
  Filled 2016-04-06: qty 1

## 2016-04-06 MED ORDER — DEXTROSE 5 % IV SOLN
1.0000 g | Freq: Once | INTRAVENOUS | Status: AC
  Administered 2016-04-06: 1 g via INTRAVENOUS
  Filled 2016-04-06: qty 10

## 2016-04-06 MED ORDER — CEPHALEXIN 500 MG PO CAPS: 500.0000 mg | ORAL_CAPSULE | Freq: Three times a day (TID) | ORAL | 0 refills | Status: DC

## 2016-04-06 MED ORDER — ONDANSETRON 4 MG PO TBDP
4.0000 mg | ORAL_TABLET | Freq: Once | ORAL | Status: AC | PRN
  Administered 2016-04-06: 4 mg via ORAL

## 2016-04-06 MED ORDER — KETOROLAC TROMETHAMINE 15 MG/ML IJ SOLN
15.0000 mg | Freq: Once | INTRAMUSCULAR | Status: AC
  Administered 2016-04-06: 15 mg via INTRAVENOUS
  Filled 2016-04-06: qty 1

## 2016-04-06 MED ORDER — OXYCODONE-ACETAMINOPHEN 5-325 MG PO TABS: 1.0000 | ORAL_TABLET | Freq: Once | ORAL | Status: DC

## 2016-04-06 NOTE — ED Provider Notes (Signed)
[facility]-EMERGENCY DEPT Provider Note   CSN: [id number] Arrival date & time: 04/06/16  0146  By signing my name below, I, Javier Docker, attest that this documentation has been prepared under the direction and in the presence of Ross Marcus, MD. Electronically Signed: Javier Docker, ER Scribe. 10/19/2015. 4:09 AM.  History   Chief Complaint Chief Complaint  Patient presents with  . Abdominal Pain   The history is provided by the patient. No language interpreter was used.    HPI Comments: Amber Nielsen is a 31 y.o. female who presents to the Emergency Department complaining of eight days of mild to moderate abdominal pain that became dramatically worse eight hours ago. She endorses associated vomiting, chills, constipation, SOB today. She describes her pain as severe sharp aching abdominal pain. She denies fever, CP. She took 800mg  ibuprofen and tylenol today with minimal relief. She has a PMHx of IBS with associated abdominal pain but her pain today is more severe than she's experienced in the past. Her pain is 10/10. She was seen at [facility] hospital two days ago with the same sx and was diagnosed with a possible low back strain with pain referred to pelvis.   Past Medical History:  Diagnosis Date  . Anxiety   . Asthma    allergy related. rodent animals  . Bipolar 1 disorder (HCC)   . Bipolar 1 disorder (HCC)   . Depression   . Dizziness 07/03/2013  . Gestational diabetes    diet controlled  . H/O varicella   . Headache(784.0)   . HTN (hypertension)   . IBS (irritable bowel syndrome)   . Kidney stones   . Obesity   . Ovarian cyst   . Pregnancy induced hypertension   . Proteinuria complicating pregnancy     Patient Active Problem List   Diagnosis Date Noted  . 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 Cannabis abuse 02/02/2016  . Morbid obesity (HCC) 12/15/2012    Past Surgical History:  Procedure Laterality  Date  . COLONOSCOPY    . ibs    . TOOTH EXTRACTION      OB History    Gravida Para Term Preterm AB Living   3 2 2   1 2    SAB TAB Ectopic Multiple Live Births   1       2       Home Medications    Prior to Admission medications   Medication Sig Start Date End Date Taking? Authorizing Provider  citalopram (CELEXA) 20 MG tablet Take 20 mg by mouth daily.   Yes Historical Provider, MD  traZODone (DESYREL) 50 MG tablet Take 0.5 tablets (25 mg total) by mouth at bedtime as needed for sleep. 02/04/16  Yes Jimmy Footman, MD    Family History Family History  Problem Relation Age of Onset  . Heart disease Father   . Heart attack Father   . Nephrolithiasis Father   . Colon polyps Father   . Mental retardation Maternal Aunt   . Diabetes Maternal Grandmother   . Cancer Maternal Grandmother     ovarian and breast  . Diabetes Maternal Grandfather   . Diabetes Paternal Grandmother   . Hypertension Paternal Grandmother   . Heart disease Paternal Grandmother   . Colon polyps Paternal Grandmother   . Diabetes Paternal Grandfather   . Colon polyps Paternal Uncle     Social History Social History  Substance Use Topics  . Smoking status: Current  Every Day Smoker    Packs/day: 1.00    Types: Cigarettes    Last attempt to quit: 03/09/2005  . Smokeless tobacco: Current User  . Alcohol use No     Allergies   Celery oil; Cymbalta [duloxetine hcl]; Lamictal [lamotrigine]; and Prozac [fluoxetine hcl]   Review of Systems Review of Systems  Constitutional: Positive for chills. Negative for fever.  Respiratory: Negative for shortness of breath.   Cardiovascular: Negative for chest pain.  Gastrointestinal: Positive for abdominal pain, constipation and vomiting.  Genitourinary: Positive for dysuria. Negative for hematuria.  All other systems reviewed and are negative.    Physical Exam Updated Vital Signs BP 121/75   Pulse 80   Temp 97.8 F (36.6 C) (Oral)   Resp  22   LMP 03/08/2016 (Exact Date)   SpO2 95%   Physical Exam  Constitutional: She is oriented to person, place, and time.  Uncomfortable appearing, obese  HENT:  Head: Normocephalic and atraumatic.  Cardiovascular: Normal rate, regular rhythm and normal heart sounds.   No murmur heard. Pulmonary/Chest: Effort normal and breath sounds normal. No respiratory distress. She has no wheezes.  Abdominal: Soft. Bowel sounds are normal. There is no tenderness. There is no guarding.  Genitourinary:  Genitourinary Comments: CVA tenderness  Neurological: She is alert and oriented to person, place, and time.  Skin: Skin is warm and dry.  Psychiatric: She has a normal mood and affect.  Nursing note and vitals reviewed.    ED Treatments / Results  DIAGNOSTIC STUDIES: Oxygen Saturation is 100% on RA, normal by my interpretation.    COORDINATION OF CARE: 3:49 AM Discussed treatment plan with pt at bedside and pt agreed to plan.  Labs (all labs ordered are listed, but only abnormal results are displayed) Labs Reviewed  COMPREHENSIVE METABOLIC PANEL - Abnormal; Notable for the following:       Result Value   CO2 21 (*)    Glucose, Bld 154 (*)    Creatinine, Ser 1.01 (*)    AST 46 (*)    All other components within normal limits  CBC - Abnormal; Notable for the following:    WBC 21.9 (*)    All other components within normal limits  URINALYSIS, ROUTINE W REFLEX MICROSCOPIC - Abnormal; Notable for the following:    APPearance HAZY (*)    Specific Gravity, Urine 1.033 (*)    Glucose, UA 50 (*)    Hgb urine dipstick SMALL (*)    Ketones, ur 20 (*)    Protein, ur 30 (*)    Leukocytes, UA TRACE (*)    Squamous Epithelial / LPF 6-30 (*)    All other components within normal limits  URINE CULTURE  LIPASE, BLOOD  POC URINE PREG, ED    EKG  EKG Interpretation None       Radiology Ct Abdomen Pelvis W Contrast  Result Date: 04/06/2016 CLINICAL DATA:  Left-sided abdominal pain.  EXAM: CT ABDOMEN AND PELVIS WITH CONTRAST TECHNIQUE: Multidetector CT imaging of the abdomen and pelvis was performed using the standard protocol following bolus administration of intravenous contrast. CONTRAST:  100mL ISOVUE-300 IOPAMIDOL (ISOVUE-300) INJECTION 61% COMPARISON:  CT 08/07/2014 FINDINGS: Lower chest: The lung bases are clear. Hepatobiliary: Decreased hepatic density consistent with steatosis. Mild focal fatty sparing adjacent to the gallbladder fossa. No calcified gallstones or biliary dilatation. Pancreas: No ductal dilatation or inflammation. Spleen: Upper normal in size spanning 12.9 cm. No focal abnormality. Adrenals/Urinary Tract: Moderate left hydroureteronephrosis and perinephric edema. Suspected  punctate nonobstructing stone in the distal left ureter just proximal to the ureterovesicular junction. Punctate nonobstructing stone in the lower left kidney. Additional pelvic calcifications are phleboliths. No right hydronephrosis. Urinary bladder is minimally distended without wall thickening. Normal adrenal glands. Stomach/Bowel: Descending and sigmoid colonic diverticulosis without acute inflammation. Normal appendix. Stomach and small bowel are decompressed. Vascular/Lymphatic: No significant vascular findings are present. No enlarged abdominal or pelvic lymph nodes. Reproductive: Uterus and bilateral adnexa are unremarkable. Other: Tiny fat containing umbilical hernia. No free air, free fluid, or intra-abdominal fluid collection. Musculoskeletal: There are no acute or suspicious osseous abnormalities. IMPRESSION: Moderate left hydronephrosis and perinephric edema with suspected punctate stone in the distal left ureter. Additional nonobstructing left nephrolithiasis. Hepatic steatosis. Electronically Signed   By: Rubye Oaks M.D.   On: 04/06/2016 05:19    Procedures Procedures (including critical care time)  Medications Ordered in ED Medications  ondansetron (ZOFRAN-ODT)  disintegrating tablet 4 mg (4 mg Oral Given 04/06/16 0203)  morphine 4 MG/ML injection 4 mg (4 mg Intravenous Given 04/06/16 0417)  ondansetron (ZOFRAN) injection 4 mg (4 mg Intravenous Given 04/06/16 0417)  sodium chloride 0.9 % bolus 1,000 mL (0 mLs Intravenous Stopped 04/06/16 0641)  iopamidol (ISOVUE-300) 61 % injection (100 mLs  Contrast Given 04/06/16 0457)  cefTRIAXone (ROCEPHIN) 1 g in dextrose 5 % 50 mL IVPB (1 g Intravenous New Bag/Given 04/06/16 0643)  ketorolac (TORADOL) 15 MG/ML injection 15 mg (15 mg Intravenous Given 04/06/16 0643)  HYDROmorphone (DILAUDID) injection 1 mg (1 mg Intravenous Given 04/06/16 0642)  sodium chloride 0.9 % bolus 1,000 mL (1,000 mLs Intravenous New Bag/Given 04/06/16 1610)     Initial Impression / Assessment and Plan / ED Course  I have reviewed the triage vital signs and the nursing notes.  Pertinent labs & imaging results that were available during my care of the patient were reviewed by me and considered in my medical decision making (see chart for details).  Clinical Course as of Apr 06 800  Mon Apr 06, 2016  0802 CT Abdomen Pelvis W Contrast [CH]    Clinical Course User Index [CH] Shon Baton, MD    Patient presents with left-sided back and abdominal pain. She is uncomfortable appearing but nontoxic on exam. Lab work from triage notable for white count of 20K. No CVA tenderness. No obvious UTI on urinalysis. Urine culture sent. CT obtained given persistence of pain and exam. As well as white count. CT scan concerning for likely punctate stone left side. She has some hydronephrosis which likely supports recently passed or passing kidney stone. On recheck, patient reports that she feels improved. If able to tolerate fluids, will discharge with pain medication. We'll also provide and bodyaches given patient's white count. She was given Rocephin in the ED.  Final Clinical Impressions(s) / ED Diagnoses   Final diagnoses:  Kidney stone    New  Prescriptions New Prescriptions   No medications on file   I personally performed the services described in this documentation, which was scribed in my presence. The recorded information has been reviewed and is accurate.       Shon Baton, MD 04/06/16 [phone]

## 2016-04-06 NOTE — Discharge Instructions (Signed)
If you develop worsening pain or fever, you should be reevaluated.

## 2016-04-06 NOTE — ED Notes (Signed)
Pt did not need anything at this time  

## 2016-04-06 NOTE — ED Triage Notes (Signed)
Patient with abdominal pain that started earlier this evening, she states that it started at her flank on the right, now it is down all over abdomen.  Patient has IBS with constipation and has taken a suppository to help with going to the bathroom.  Patient did vomit x1 and has not vomited since earlier this evening, but she is nauseated.  Patient is CAOx4, speaking in full sentences.  She states that she can't breath due to the pain.

## 2016-04-06 NOTE — ED Notes (Signed)
Pt ambulated to restroom from room, tolerated well. 

## 2016-04-06 NOTE — ED Notes (Signed)
Patient transported to CT 

## 2016-04-07 LAB — URINE CULTURE

## 2016-08-26 ENCOUNTER — Inpatient Hospital Stay (HOSPITAL_COMMUNITY): Payer: Self-pay | Admitting: Certified Registered"

## 2016-08-26 ENCOUNTER — Emergency Department (HOSPITAL_COMMUNITY): Payer: Self-pay

## 2016-08-26 ENCOUNTER — Encounter (HOSPITAL_COMMUNITY): Payer: Self-pay

## 2016-08-26 ENCOUNTER — Encounter (HOSPITAL_COMMUNITY)
Admission: EM | Disposition: A | Discharge status: Home / Self Care | Payer: Self-pay | Source: Home / Self Care | Attending: Internal Medicine

## 2016-08-26 ENCOUNTER — Inpatient Hospital Stay (HOSPITAL_COMMUNITY)
Admission: EM | Admit: 2016-08-26 | Discharge: 2016-08-30 | DRG: 872 | Disposition: A | Discharge status: Home / Self Care | Payer: Self-pay | Attending: Internal Medicine | Admitting: Internal Medicine

## 2016-08-26 ENCOUNTER — Inpatient Hospital Stay (HOSPITAL_COMMUNITY): Payer: Self-pay

## 2016-08-26 DIAGNOSIS — F17210 Nicotine dependence, cigarettes, uncomplicated: Secondary | ICD-10-CM | POA: Diagnosis present

## 2016-08-26 DIAGNOSIS — F319 Bipolar disorder, unspecified: Secondary | ICD-10-CM | POA: Diagnosis present

## 2016-08-26 DIAGNOSIS — N200 Calculus of kidney: Secondary | ICD-10-CM | POA: Diagnosis not present

## 2016-08-26 DIAGNOSIS — A419 Sepsis, unspecified organism: Principal | ICD-10-CM | POA: Diagnosis present

## 2016-08-26 DIAGNOSIS — N136 Pyonephrosis: Secondary | ICD-10-CM | POA: Diagnosis present

## 2016-08-26 DIAGNOSIS — K760 Fatty (change of) liver, not elsewhere classified: Secondary | ICD-10-CM | POA: Diagnosis present

## 2016-08-26 DIAGNOSIS — Z888 Allergy status to other drugs, medicaments and biological substances status: Secondary | ICD-10-CM

## 2016-08-26 DIAGNOSIS — N132 Hydronephrosis with renal and ureteral calculous obstruction: Secondary | ICD-10-CM | POA: Diagnosis present

## 2016-08-26 DIAGNOSIS — Z79899 Other long term (current) drug therapy: Secondary | ICD-10-CM

## 2016-08-26 DIAGNOSIS — F329 Major depressive disorder, single episode, unspecified: Secondary | ICD-10-CM | POA: Diagnosis present

## 2016-08-26 DIAGNOSIS — F419 Anxiety disorder, unspecified: Secondary | ICD-10-CM | POA: Diagnosis present

## 2016-08-26 DIAGNOSIS — N10 Acute tubulo-interstitial nephritis: Secondary | ICD-10-CM

## 2016-08-26 DIAGNOSIS — Z419 Encounter for procedure for purposes other than remedying health state, unspecified: Secondary | ICD-10-CM

## 2016-08-26 DIAGNOSIS — J45909 Unspecified asthma, uncomplicated: Secondary | ICD-10-CM | POA: Diagnosis present

## 2016-08-26 DIAGNOSIS — N12 Tubulo-interstitial nephritis, not specified as acute or chronic: Secondary | ICD-10-CM

## 2016-08-26 DIAGNOSIS — I10 Essential (primary) hypertension: Secondary | ICD-10-CM | POA: Diagnosis present

## 2016-08-26 DIAGNOSIS — E876 Hypokalemia: Secondary | ICD-10-CM | POA: Diagnosis present

## 2016-08-26 DIAGNOSIS — N390 Urinary tract infection, site not specified: Secondary | ICD-10-CM

## 2016-08-26 DIAGNOSIS — Z6841 Body Mass Index (BMI) 40.0 and over, adult: Secondary | ICD-10-CM

## 2016-08-26 DIAGNOSIS — F1290 Cannabis use, unspecified, uncomplicated: Secondary | ICD-10-CM | POA: Diagnosis present

## 2016-08-26 DIAGNOSIS — D649 Anemia, unspecified: Secondary | ICD-10-CM | POA: Diagnosis present

## 2016-08-26 DIAGNOSIS — K589 Irritable bowel syndrome without diarrhea: Secondary | ICD-10-CM | POA: Diagnosis present

## 2016-08-26 HISTORY — DX: Urinary tract infection, site not specified: N39.0

## 2016-08-26 HISTORY — DX: Sepsis, unspecified organism: A41.9

## 2016-08-26 HISTORY — PX: CYSTOSCOPY W/ URETERAL STENT PLACEMENT: SHX1429

## 2016-08-26 LAB — URINALYSIS, ROUTINE W REFLEX MICROSCOPIC
Bilirubin Urine: NEGATIVE
Glucose, UA: NEGATIVE mg/dL
Ketones, ur: NEGATIVE mg/dL
Nitrite: NEGATIVE
Protein, ur: NEGATIVE mg/dL
Specific Gravity, Urine: 1.013 (ref 1.005–1.030)
pH: 6 (ref 5.0–8.0)

## 2016-08-26 LAB — COMPREHENSIVE METABOLIC PANEL
ALT: 25 U/L (ref 14–54)
AST: 21 U/L (ref 15–41)
Albumin: 3.1 g/dL — ABNORMAL LOW (ref 3.5–5.0)
Alkaline Phosphatase: 67 U/L (ref 38–126)
Anion gap: 8 (ref 5–15)
BUN: 5 mg/dL — ABNORMAL LOW (ref 6–20)
CO2: 26 mmol/L (ref 22–32)
Calcium: 8.5 mg/dL — ABNORMAL LOW (ref 8.9–10.3)
Chloride: 104 mmol/L (ref 101–111)
Creatinine, Ser: 0.74 mg/dL (ref 0.44–1.00)
GFR calc Af Amer: >60 mL/min (ref >60)
GFR calc non Af Amer: >60 mL/min (ref >60)
Glucose, Bld: 98 mg/dL (ref 65–99)
Potassium: 3.2 mmol/L — ABNORMAL LOW (ref 3.5–5.1)
Sodium: 138 mmol/L (ref 135–145)
Total Bilirubin: 1 mg/dL (ref 0.3–1.2)
Total Protein: 6.7 g/dL (ref 6.5–8.1)

## 2016-08-26 LAB — CBC
HCT: 39.8 % (ref 36.0–46.0)
Hemoglobin: 12.8 g/dL (ref 12.0–15.0)
MCH: 29.8 pg (ref 26.0–34.0)
MCHC: 32.2 g/dL (ref 30.0–36.0)
MCV: 92.6 fL (ref 78.0–100.0)
Platelets: 281 10*3/uL (ref 150–400)
RBC: 4.3 MIL/uL (ref 3.87–5.11)
RDW: 13.3 % (ref 11.5–15.5)
WBC: 11.7 10*3/uL — ABNORMAL HIGH (ref 4.0–10.5)

## 2016-08-26 LAB — I-STAT CG4 LACTIC ACID, ED: Lactic Acid, Venous: 1.68 mmol/L (ref 0.5–1.9)

## 2016-08-26 LAB — POC URINE PREG, ED: Preg Test, Ur: NEGATIVE

## 2016-08-26 SURGERY — CYSTOSCOPY, WITH RETROGRADE PYELOGRAM AND URETERAL STENT INSERTION
Anesthesia: General | Site: Ureter | Laterality: Left

## 2016-08-26 SURGERY — CYSTOSCOPY, WITH RETROGRADE PYELOGRAM AND URETERAL STENT INSERTION
Anesthesia: General | Laterality: Left

## 2016-08-26 MED ORDER — ONDANSETRON 4 MG PO TBDP
4.0000 mg | ORAL_TABLET | Freq: Once | ORAL | Status: AC
  Administered 2016-08-26: 4 mg via ORAL
  Filled 2016-08-26: qty 1

## 2016-08-26 MED ORDER — ENOXAPARIN SODIUM 40 MG/0.4ML ~~LOC~~ SOLN
40.0000 mg | SUBCUTANEOUS | Status: DC
  Administered 2016-08-27 – 2016-08-29 (×3): 40 mg via SUBCUTANEOUS
  Filled 2016-08-26 (×3): qty 0.4

## 2016-08-26 MED ORDER — ACETAMINOPHEN 325 MG PO TABS
650.0000 mg | ORAL_TABLET | Freq: Once | ORAL | Status: AC
  Administered 2016-08-26: 650 mg via ORAL
  Filled 2016-08-26: qty 2

## 2016-08-26 MED ORDER — ACETAMINOPHEN 325 MG PO TABS
ORAL_TABLET | ORAL | Status: AC
  Filled 2016-08-26: qty 2

## 2016-08-26 MED ORDER — SODIUM CHLORIDE 0.9 % IV SOLN
INTRAVENOUS | Status: DC
  Administered 2016-08-26: 23:00:00 via INTRAVENOUS

## 2016-08-26 MED ORDER — SODIUM CHLORIDE 0.9 % IV BOLUS (SEPSIS)
1000.0000 mL | Freq: Once | INTRAVENOUS | Status: AC
  Administered 2016-08-26: 1000 mL via INTRAVENOUS

## 2016-08-26 MED ORDER — ONDANSETRON HCL 4 MG/2ML IJ SOLN
INTRAMUSCULAR | Status: AC
  Filled 2016-08-26: qty 2

## 2016-08-26 MED ORDER — DIPHENHYDRAMINE HCL 50 MG/ML IJ SOLN
25.0000 mg | Freq: Once | INTRAMUSCULAR | Status: AC
  Administered 2016-08-26: 25 mg via INTRAVENOUS
  Filled 2016-08-26: qty 1

## 2016-08-26 MED ORDER — ACETAMINOPHEN 325 MG PO TABS
650.0000 mg | ORAL_TABLET | Freq: Once | ORAL | Status: AC | PRN
  Administered 2016-08-26: 650 mg via ORAL

## 2016-08-26 MED ORDER — STERILE WATER FOR IRRIGATION IR SOLN
Status: DC | PRN
  Administered 2016-08-26: 1000 mL via INTRAVESICAL
  Administered 2016-08-26: 1000 mL

## 2016-08-26 MED ORDER — MORPHINE SULFATE (PF) 2 MG/ML IV SOLN: 2.0000 mg | INTRAVENOUS | Status: DC | PRN

## 2016-08-26 MED ORDER — DEXAMETHASONE SODIUM PHOSPHATE 10 MG/ML IJ SOLN
INTRAMUSCULAR | Status: AC
  Filled 2016-08-26: qty 1

## 2016-08-26 MED ORDER — FENTANYL CITRATE (PF) 100 MCG/2ML IJ SOLN: 25.0000 ug | INTRAMUSCULAR | Status: DC | PRN

## 2016-08-26 MED ORDER — PROMETHAZINE HCL 25 MG/ML IJ SOLN: 6.2500 mg | INTRAMUSCULAR | Status: DC | PRN

## 2016-08-26 MED ORDER — MEPERIDINE HCL 25 MG/ML IJ SOLN: 6.2500 mg | INTRAMUSCULAR | Status: DC | PRN

## 2016-08-26 MED ORDER — ONDANSETRON HCL 4 MG/2ML IJ SOLN: 4.0000 mg | Freq: Four times a day (QID) | INTRAMUSCULAR | Status: DC | PRN

## 2016-08-26 MED ORDER — PROPOFOL 10 MG/ML IV BOLUS
INTRAVENOUS | Status: DC | PRN
  Administered 2016-08-26: 200 mg via INTRAVENOUS

## 2016-08-26 MED ORDER — MIDAZOLAM HCL 2 MG/2ML IJ SOLN
INTRAMUSCULAR | Status: AC
  Filled 2016-08-26: qty 2

## 2016-08-26 MED ORDER — DEXTROSE 5 % IV SOLN
1.0000 g | Freq: Once | INTRAVENOUS | Status: AC
  Administered 2016-08-26: 1 g via INTRAVENOUS
  Filled 2016-08-26: qty 10

## 2016-08-26 MED ORDER — PROCHLORPERAZINE EDISYLATE 5 MG/ML IJ SOLN
10.0000 mg | Freq: Once | INTRAMUSCULAR | Status: AC
  Administered 2016-08-26: 10 mg via INTRAVENOUS
  Filled 2016-08-26: qty 2

## 2016-08-26 MED ORDER — FENTANYL CITRATE (PF) 250 MCG/5ML IJ SOLN
INTRAMUSCULAR | Status: AC
  Filled 2016-08-26: qty 5

## 2016-08-26 MED ORDER — KETOROLAC TROMETHAMINE 30 MG/ML IJ SOLN
30.0000 mg | Freq: Once | INTRAMUSCULAR | Status: AC
  Administered 2016-08-26: 30 mg via INTRAVENOUS
  Filled 2016-08-26: qty 1

## 2016-08-26 MED ORDER — POTASSIUM CHLORIDE CRYS ER 20 MEQ PO TBCR: 40.0000 meq | EXTENDED_RELEASE_TABLET | ORAL | Status: AC

## 2016-08-26 MED ORDER — HYDROCODONE-ACETAMINOPHEN 5-325 MG PO TABS
1.0000 | ORAL_TABLET | Freq: Four times a day (QID) | ORAL | Status: DC | PRN
  Administered 2016-08-28 (×2): 2 via ORAL
  Administered 2016-08-28: 1 via ORAL
  Administered 2016-08-29: 2 via ORAL
  Filled 2016-08-26 (×2): qty 2
  Filled 2016-08-26 (×3): qty 1

## 2016-08-26 MED ORDER — PROPOFOL 10 MG/ML IV BOLUS
INTRAVENOUS | Status: AC
  Filled 2016-08-26: qty 20

## 2016-08-26 MED ORDER — ONDANSETRON HCL 4 MG PO TABS
4.0000 mg | ORAL_TABLET | Freq: Four times a day (QID) | ORAL | Status: DC | PRN
  Administered 2016-08-29: 4 mg via ORAL
  Filled 2016-08-26: qty 1

## 2016-08-26 MED ORDER — ONDANSETRON HCL 4 MG/2ML IJ SOLN: 4.0000 mg | Freq: Once | INTRAMUSCULAR | Status: DC

## 2016-08-26 MED ORDER — SUCCINYLCHOLINE CHLORIDE 200 MG/10ML IV SOSY
PREFILLED_SYRINGE | INTRAVENOUS | Status: DC | PRN
  Administered 2016-08-26: 120 mg via INTRAVENOUS

## 2016-08-26 MED ORDER — MIDAZOLAM HCL 2 MG/2ML IJ SOLN: 0.5000 mg | Freq: Once | INTRAMUSCULAR | Status: DC | PRN

## 2016-08-26 MED ORDER — MIDAZOLAM HCL 5 MG/5ML IJ SOLN
INTRAMUSCULAR | Status: DC | PRN
  Administered 2016-08-26 (×2): 2 mg via INTRAVENOUS

## 2016-08-26 MED ORDER — FENTANYL CITRATE (PF) 250 MCG/5ML IJ SOLN
INTRAMUSCULAR | Status: DC | PRN
  Administered 2016-08-26 (×2): 50 ug via INTRAVENOUS

## 2016-08-26 MED ORDER — ALBUTEROL SULFATE (2.5 MG/3ML) 0.083% IN NEBU: 2.5000 mg | INHALATION_SOLUTION | Freq: Four times a day (QID) | RESPIRATORY_TRACT | Status: DC | PRN

## 2016-08-26 MED ORDER — SODIUM CHLORIDE 0.9 % IV SOLN
INTRAVENOUS | Status: DC | PRN
  Administered 2016-08-26: 22:00:00 via INTRAVENOUS

## 2016-08-26 MED ORDER — IOPAMIDOL (ISOVUE-300) INJECTION 61%
INTRAVENOUS | Status: DC | PRN
  Administered 2016-08-26: 10 mL via URETHRAL

## 2016-08-26 MED ORDER — LACTATED RINGERS IV SOLN
INTRAVENOUS | Status: DC | PRN
  Administered 2016-08-26: 23:00:00 via INTRAVENOUS

## 2016-08-26 MED ORDER — TRAZODONE HCL 50 MG PO TABS
25.0000 mg | ORAL_TABLET | Freq: Every evening | ORAL | Status: DC | PRN
  Administered 2016-08-28: 25 mg via ORAL
  Filled 2016-08-26: qty 1

## 2016-08-26 MED ORDER — CITALOPRAM HYDROBROMIDE 10 MG PO TABS
20.0000 mg | ORAL_TABLET | Freq: Every day | ORAL | Status: DC
  Administered 2016-08-27 – 2016-08-30 (×4): 20 mg via ORAL
  Filled 2016-08-26 (×4): qty 2

## 2016-08-26 MED ORDER — ACETAMINOPHEN 650 MG RE SUPP: 650.0000 mg | Freq: Four times a day (QID) | RECTAL | Status: DC | PRN

## 2016-08-26 MED ORDER — ACETAMINOPHEN 325 MG PO TABS
650.0000 mg | ORAL_TABLET | Freq: Four times a day (QID) | ORAL | Status: DC | PRN
  Administered 2016-08-27 – 2016-08-30 (×5): 650 mg via ORAL
  Filled 2016-08-26 (×5): qty 2

## 2016-08-26 MED ORDER — DEXTROSE 5 % IV SOLN
1.0000 g | INTRAVENOUS | Status: DC
  Filled 2016-08-26: qty 10

## 2016-08-26 MED ORDER — IOPAMIDOL (ISOVUE-300) INJECTION 61%
INTRAVENOUS | Status: AC
  Filled 2016-08-26: qty 50

## 2016-08-26 MED ORDER — ONDANSETRON HCL 4 MG/2ML IJ SOLN
INTRAMUSCULAR | Status: DC | PRN
  Administered 2016-08-26: 4 mg via INTRAVENOUS

## 2016-08-26 MED ORDER — DEXAMETHASONE SODIUM PHOSPHATE 10 MG/ML IJ SOLN
INTRAMUSCULAR | Status: DC | PRN
  Administered 2016-08-26: 6 mg via INTRAVENOUS

## 2016-08-26 MED ORDER — LIDOCAINE 2% (20 MG/ML) 5 ML SYRINGE
INTRAMUSCULAR | Status: DC | PRN
  Administered 2016-08-26: 20 mg via INTRAVENOUS

## 2016-08-26 MED ORDER — ALBUTEROL SULFATE HFA 108 (90 BASE) MCG/ACT IN AERS
INHALATION_SPRAY | RESPIRATORY_TRACT | Status: DC | PRN
  Administered 2016-08-26: 4 via RESPIRATORY_TRACT

## 2016-08-26 SURGICAL SUPPLY — 37 items
ADAPTER CATH URET PLST 4-6FR (CATHETERS) IMPLANT
BAG URINE DRAINAGE (UROLOGICAL SUPPLIES) IMPLANT
BAG URO CATCHER STRL LF (MISCELLANEOUS) ×2 IMPLANT
BENZOIN TINCTURE PRP APPL 2/3 (GAUZE/BANDAGES/DRESSINGS) IMPLANT
BLADE 10 SAFETY STRL DISP (BLADE) IMPLANT
BUCKET BIOHAZARD WASTE 5 GAL (MISCELLANEOUS) ×2 IMPLANT
CATH FOLEY 2WAY SLVR  5CC 16FR (CATHETERS)
CATH FOLEY 2WAY SLVR 5CC 16FR (CATHETERS) IMPLANT
CATH INTERMIT  6FR 70CM (CATHETERS) ×2 IMPLANT
CATH URET 5FR 28IN CONE TIP (BALLOONS)
CATH URET 5FR 70CM CONE TIP (BALLOONS) IMPLANT
DRAPE CAMERA CLOSED 9X96 (DRAPES) IMPLANT
GLOVE BIO SURGEON STRL SZ7.5 (GLOVE) ×2 IMPLANT
GLOVE BIOGEL PI IND STRL 7.5 (GLOVE) ×1 IMPLANT
GLOVE BIOGEL PI INDICATOR 7.5 (GLOVE) ×1
GLOVE SURG SS PI 7.5 STRL IVOR (GLOVE) ×2 IMPLANT
GLOVE SURG SS PI 8.0 STRL IVOR (GLOVE) ×2 IMPLANT
GOWN STRL REUS W/ TWL LRG LVL3 (GOWN DISPOSABLE) ×1 IMPLANT
GOWN STRL REUS W/ TWL XL LVL3 (GOWN DISPOSABLE) ×1 IMPLANT
GOWN STRL REUS W/TWL LRG LVL3 (GOWN DISPOSABLE) ×1
GOWN STRL REUS W/TWL XL LVL3 (GOWN DISPOSABLE) ×1
GUIDEWIRE COOK  .035 (WIRE) IMPLANT
GUIDEWIRE STR DUAL SENSOR (WIRE) ×2 IMPLANT
KIT ROOM TURNOVER OR (KITS) ×2 IMPLANT
NS IRRIG 1000ML POUR BTL (IV SOLUTION) ×2 IMPLANT
PACK CYSTO (CUSTOM PROCEDURE TRAY) ×2 IMPLANT
PAD ARMBOARD 7.5X6 YLW CONV (MISCELLANEOUS) ×4 IMPLANT
PLUG CATH AND CAP STER (CATHETERS) IMPLANT
STENT URET 6FRX24 CONTOUR (STENTS) IMPLANT
STENT URET 6FRX26 CONTOUR (STENTS) ×2 IMPLANT
SYRINGE CONTROL L 12CC (SYRINGE) ×2 IMPLANT
SYRINGE TOOMEY DISP (SYRINGE) IMPLANT
TUBE CONNECTING 12X1/4 (SUCTIONS) ×2 IMPLANT
UNDERPAD 30X30 (UNDERPADS AND DIAPERS) ×2 IMPLANT
WATER STERILE IRR 1000ML POUR (IV SOLUTION) ×2 IMPLANT
WATER STERILE IRR 1000ML UROMA (IV SOLUTION) ×2 IMPLANT
WIRE COONS/BENSON .038X145CM (WIRE) IMPLANT

## 2016-08-26 NOTE — ED Notes (Signed)
Pt sitting up in bed watching TV and talking to visitors.  States she is feeling better then when she came in.

## 2016-08-26 NOTE — Interval H&P Note (Signed)
History and Physical Interval Note:  08/26/2016 9:56 PM  Amber Nielsen  has presented today for surgery, with the diagnosis of infected uteral stone  The various methods of treatment have been discussed with the patient and family. After consideration of risks, benefits and other options for treatment, the patient has consented to  Procedure(s): CYSTOSCOPY WITH RETROGRADE PYELOGRAM/URETERAL STENT PLACEMENT (Left) as a surgical intervention .  The patient's history has been reviewed, patient examined, no change in status, stable for surgery.  I have reviewed the patient's chart and labs.  Questions were answered to the patient's satisfaction.     Chelsea AusDAHLSTEDT, Gayleen Sholtz M

## 2016-08-26 NOTE — H&P (View-Only) (Signed)
New Consult Note  Requesting Physician: Abelino DerrickMackuen, Courteney Lyn, *  Service Requesting Consult: Emergency Dept  Urology Consult Attending: Charmel Pronovost Reason for Consult:  Infected stone  Subjective: Amber Nielsen is seen in consultation for reasons noted above. This is a 31 yo patient with a history of anxiety/depression, bipolar disorder, hypertension, IBS, obesity, and nephrolithiasis presenting with left sided back pain since yesterday. Has had associated fevers and malaise, poor PO intake. Nothing has improved the pain. She has had prior kidney stones but has never required surgery. She is not pregnant. She had a head CT that was negative.   Past Medical History: Past Medical History:  Diagnosis Date  . Anxiety   . Asthma    allergy related. rodent animals  . Bipolar 1 disorder (HCC)   . Bipolar 1 disorder (HCC)   . Depression   . Dizziness 07/03/2013  . Gestational diabetes    diet controlled  . H/O varicella   . Headache(784.0)   . HTN (hypertension)   . IBS (irritable bowel syndrome)   . Kidney stones   . Obesity   . Ovarian cyst   . Pregnancy induced hypertension   . Proteinuria complicating pregnancy     Past Surgical History:  Past Surgical History:  Procedure Laterality Date  . COLONOSCOPY    . ibs    . TOOTH EXTRACTION      Medication: Current Facility-Administered Medications  Medication Dose Route Frequency Provider Last Rate Last Dose  . cefTRIAXone (ROCEPHIN) 1 g in dextrose 5 % 50 mL IVPB  1 g Intravenous Once Mackuen, Courteney Lyn, MD 100 mL/hr at 08/26/16 2028 1 g at 08/26/16 2028   Current Outpatient Prescriptions  Medication Sig Dispense Refill  . cephALEXin (KEFLEX) 500 MG capsule Take 1 capsule (500 mg total) by mouth 3 (three) times daily. 21 capsule 0  . citalopram (CELEXA) 20 MG tablet Take 20 mg by mouth daily.    Marland Kitchen. HYDROcodone-acetaminophen (NORCO/VICODIN) 5-325 MG tablet Take 1-2 tablets by mouth every 6 (six) hours as needed. 10  tablet 0  . naproxen (NAPROSYN) 500 MG tablet Take 1 tablet (500 mg total) by mouth 2 (two) times daily. 30 tablet 0  . traZODone (DESYREL) 50 MG tablet Take 0.5 tablets (25 mg total) by mouth at bedtime as needed for sleep. 30 tablet 0    Allergies: Allergies  Allergen Reactions  . Celery Oil Swelling    Tongue swelling when eating celery  . Cymbalta [Duloxetine Hcl] Other (See Comments)    "out of body"  . Lamictal [Lamotrigine] Other (See Comments)    unknown  . Prozac [Fluoxetine Hcl] Other (See Comments)    Anger problems    Social History: Social History  Substance Use Topics  . Smoking status: Current Every Day Smoker    Packs/day: 1.00    Types: Cigarettes    Last attempt to quit: 03/09/2005  . Smokeless tobacco: Current User  . Alcohol use No    Family History Family History  Problem Relation Age of Onset  . Heart disease Father   . Heart attack Father   . Nephrolithiasis Father   . Colon polyps Father   . Mental retardation Maternal Aunt   . Diabetes Maternal Grandmother   . Cancer Maternal Grandmother        ovarian and breast  . Diabetes Maternal Grandfather   . Diabetes Paternal Grandmother   . Hypertension Paternal Grandmother   . Heart disease Paternal Grandmother   . Colon  polyps Paternal Grandmother   . Diabetes Paternal Grandfather   . Colon polyps Paternal Uncle     Review of Systems 10 systems were reviewed and are negative except as noted specifically in the HPI.  Objective: Vital signs in last 24 hours: BP 123/65   Pulse 95   Temp (!) 102 F (38.9 C) (Oral)   Resp 16   Ht 5\' 10"  (1.778 m)   Wt 129.3 kg (285 lb)   LMP 08/23/2016 (Exact Date)   SpO2 97%   BMI 40.89 kg/m   Intake/Output last 3 shifts: No intake/output data recorded.  Physical Exam General: NAD, resting, appropriate HEENT: [place]/AT, EOMI, MMM Pulmonary: Normal work of breathing on RA Cardiovascular: Regular rate & rhythm, HDS, adequate peripheral  perfusion Abdomen: soft, tenderness left abdomen, nondistended GU: left CVA tenderness Extremities: warm and well perfused, no edema Neuro: Appropriate, no focal neurological deficits  Most Recent Labs: Lab Results  Component Value Date   WBC 11.7 (H) 08/26/2016   HGB 12.8 08/26/2016   HCT 39.8 08/26/2016   PLT 281 08/26/2016    Lab Results  Component Value Date   NA 138 08/26/2016   K 3.2 (L) 08/26/2016   CL 104 08/26/2016   CO2 26 08/26/2016   BUN 5 (L) 08/26/2016   CREATININE 0.74 08/26/2016   CALCIUM 8.5 (L) 08/26/2016    Lab Results  Component Value Date   ALKPHOS 67 08/26/2016   BILITOT 1.0 08/26/2016   PROT 6.7 08/26/2016   ALBUMIN 3.1 (L) 08/26/2016   ALT 25 08/26/2016   AST 21 08/26/2016    No results found for: INR, APTT  IMAGING: Dg Chest 2 View  Result Date: 08/26/2016 CLINICAL DATA:  31 year old female with fever, nausea vomiting. EXAM: CHEST  2 VIEW COMPARISON:  Chest radiograph dated 03/08/2013 FINDINGS: The heart size and mediastinal contours are within normal limits. Both lungs are clear. The visualized skeletal structures are unremarkable. IMPRESSION: No active cardiopulmonary disease. Electronically Signed   By: Elgie Collard M.D.   On: 08/26/2016 18:40   Ct Head Wo Contrast  Result Date: 08/26/2016 CLINICAL DATA:  Migraine for 2 days.  Nausea. EXAM: CT HEAD WITHOUT CONTRAST TECHNIQUE: Contiguous axial images were obtained from the base of the skull through the vertex without intravenous contrast. COMPARISON:  None. FINDINGS: Brain: No acute intracranial abnormality. Specifically, no hemorrhage, hydrocephalus, mass lesion, acute infarction, or significant intracranial injury. Vascular: No hyperdense vessel or unexpected calcification. Skull: No acute calvarial abnormality. Sinuses/Orbits: Visualized paranasal sinuses and mastoids clear. Orbital soft tissues unremarkable. Other: None IMPRESSION: Normal study. Electronically Signed   By: Charlett Nose  M.D.   On: 08/26/2016 17:06   Ct Renal Stone Study  Result Date: 08/26/2016 CLINICAL DATA:  31 year old female with low back pain and fever. EXAM: CT ABDOMEN AND PELVIS WITHOUT CONTRAST TECHNIQUE: Multidetector CT imaging of the abdomen and pelvis was performed following the standard protocol without IV contrast. COMPARISON:  Abdominal CT dated 04/06/2016 FINDINGS: Evaluation of this exam is limited in the absence of intravenous contrast. Lower chest: The visualized lung bases are clear. No intra-abdominal free air or free fluid. Hepatobiliary: Diffuse fatty infiltration of the liver. No intrahepatic biliary ductal dilatation. The gallbladder is unremarkable. Pancreas: Unremarkable. No pancreatic ductal dilatation or surrounding inflammatory changes. Spleen: Normal in size without focal abnormality. Adrenals/Urinary Tract: The adrenal glands are unremarkable. There is a 3 mm stone in the distal left ureter close to the left ureterovesical junction. There is mild left hydronephroureter. A  2 mm nonobstructing left renal inferior pole calculus is seen. There is no hydronephrosis or nephrolithiasis on the right. The right ureter and urinary bladder appear unremarkable. Stomach/Bowel: There are small scattered sigmoid diverticula without active inflammatory changes. There is no evidence of bowel obstruction or active inflammation. Normal appendix. Vascular/Lymphatic: The abdominal aorta and IVC are grossly unremarkable on this noncontrast study. No portal venous gas identified. There is no adenopathy. Reproductive: The uterus is anteverted and grossly unremarkable. The ovaries appear unremarkable as well. No pelvic mass. Other: None Musculoskeletal: No acute or significant osseous findings. IMPRESSION: 1. A 3 mm distal left ureteral stone with mild left hydronephrosis. Correlation with urinalysis recommended to exclude superimposed UTI. A 2 mm nonobstructing left renal inferior pole stone is also noted. 2. Fatty  liver. 3. Small scattered colonic diverticula. No bowel obstruction or active inflammation. Normal appendix. Electronically Signed   By: Elgie Collard M.D.   On: 08/26/2016 20:13     Assessment: Patient is a 31 y.o. female with history of nephrolithiasis presenting with left flank pain and fever secondary to a left 3mm distal ureteral stone with sepsis. Actively febrile and tachycardic in the ED.  We discussed the recommended management for drainage of the kidney by ureteral stent placement. Discussed the procedure details, risks, benefits, alternatives, and complications. She is eager to proceed.   Recommendations: 1. OR for emergent cysto left ureteral stent placement 2. Admit to hospitalist medicine for management of sepsis.  3. Patient will require outpatient ureteral stone extraction to be scheduled after discharge.  Discussed with Dr. Retta Diones. Thank you for this consult. Please do not hesitate to contact us with any further questions/concerns.  Buck Mam, MD PGY4 Urology Resident  I have interviewed the pt and agree with the above assessment and plan

## 2016-08-26 NOTE — ED Triage Notes (Signed)
Pt reports fever, nausea, vomiting, muscle pain, neck and back pain; onset yesterday. She also reports on Monday she was having very bad period cramps.

## 2016-08-26 NOTE — H&P (Addendum)
History and Physical    Amber Nielsen ZOX:[id number] DOB: 10/02/1985 DOA: 08/26/2016  Referring MD/NP/PA: Dr. Corlis Leak PCP: Cain Saupe, MD  Patient coming from: Home  Chief Complaint: Fever and headache  HPI: Amber Nielsen is a 31 y.o. female with medical history significant of HTN, asthma, bipolar disorder, obesity, nephrolithiasis and gestational diabetes diet controlled; who presents with complaints of fever and headache since yesterday. Patient has been taking Tylenol without relief of symptoms. She reports associated complaints of left-sided back pain, nausea, vomiting, poor PO intake, cramps, and generalized malaise. Patient is currently also on her menstrual period which started 2 days ago.  ED Course: Upon admission into the emergency department patient was seen to be febrile up to 103.62F, pulse up to 125, and all other vital signs relatively within normal limits. Labs revealed WBC 11.7, potassium 3.2, and lactic acid 1.68. Urinalysis was positive for signs of infection. CT scan of the abdomen revealed a 3 mm distal left ureteral stone with mild left hydronephrosis and a 2 mm nonobstructing left renal inferior pole stone. Dr. Buck Mam of urology was consulted and recommended emergent cystoscopy with left ureteral stent placement. Patient had been given Rocephin, 2 L of normal saline fluids, Tylenol, Benadryl, ketorolac, and compazine while in the ED. [facility] called to admit.  Review of Systems: As per HPI otherwise 10 point review of systems negative.   Past Medical History:  Diagnosis Date  . Anxiety   . Asthma    allergy related. rodent animals  . Bipolar 1 disorder (HCC)   . Bipolar 1 disorder (HCC)   . Depression   . Dizziness 07/03/2013  . Gestational diabetes    diet controlled  . H/O varicella   . Headache(784.0)   . HTN (hypertension)   . IBS (irritable bowel syndrome)   . Kidney stones   . Obesity   . Ovarian cyst   . Pregnancy induced hypertension   .  Proteinuria complicating pregnancy     Past Surgical History:  Procedure Laterality Date  . COLONOSCOPY    . ibs    . TOOTH EXTRACTION       reports that she has been smoking Cigarettes.  She has been smoking about 1.00 pack per day. She uses smokeless tobacco. She reports that she uses drugs. She reports that she does not drink alcohol.  Allergies  Allergen Reactions  . Celery Oil Swelling    Tongue swelling when eating celery  . Cymbalta [Duloxetine Hcl] Other (See Comments)    "out of body"  . Lamictal [Lamotrigine] Other (See Comments)    unknown  . Prozac [Fluoxetine Hcl] Other (See Comments)    Anger problems    Family History  Problem Relation Age of Onset  . Heart disease Father   . Heart attack Father   . Nephrolithiasis Father   . Colon polyps Father   . Mental retardation Maternal Aunt   . Diabetes Maternal Grandmother   . Cancer Maternal Grandmother        ovarian and breast  . Diabetes Maternal Grandfather   . Diabetes Paternal Grandmother   . Hypertension Paternal Grandmother   . Heart disease Paternal Grandmother   . Colon polyps Paternal Grandmother   . Diabetes Paternal Grandfather   . Colon polyps Paternal Uncle     Prior to Admission medications   Medication Sig Start Date End Date Taking? Authorizing Provider  cephALEXin (KEFLEX) 500 MG capsule Take 1 capsule (500 mg total) by mouth 3 (  three) times daily. 04/06/16   Horton, Mayer Masker, MD  citalopram (CELEXA) 20 MG tablet Take 20 mg by mouth daily.    [provider]  HYDROcodone-acetaminophen (NORCO/VICODIN) 5-325 MG tablet Take 1-2 tablets by mouth every 6 (six) hours as needed. 04/06/16   Horton, Mayer Masker, MD  naproxen (NAPROSYN) 500 MG tablet Take 1 tablet (500 mg total) by mouth 2 (two) times daily. 04/06/16   Horton, Mayer Masker, MD  traZODone (DESYREL) 50 MG tablet Take 0.5 tablets (25 mg total) by mouth at bedtime as needed for sleep. 02/04/16   Jimmy Footman, MD     Physical Exam:    Constitutional: Young female who appears acutely ill and appears uncomfortable Vitals:   08/26/16 1900 08/26/16 1930 08/26/16 2031 08/26/16 2055  BP: 115/66 123/65 116/64 119/65  Pulse: (!) 102 95 99 89  Resp: 16   16  Temp:      TempSrc:      SpO2: 97%   97%  Weight:      Height:       Eyes: PERRL, lids and conjunctivae normal ENMT: Mucous membranes are dry. Posterior pharynx clear of any exudate or lesions.Normal dentition.  Neck: normal, supple, no masses, no thyromegaly Respiratory: clear to auscultation bilaterally, no wheezing, no crackles. Normal respiratory effort. No accessory muscle use.  Cardiovascular: Regular rate and rhythm, no murmurs / rubs / gallops. No extremity edema. 2+ pedal pulses. No carotid bruits.  Abdomen: Positive left-sided CVA tenderness, no masses palpated. No hepatosplenomegaly. Bowel sounds positive.  Musculoskeletal: no clubbing / cyanosis. No joint deformity upper and lower extremities. Good ROM, no contractures. Normal muscle tone.  Skin: no rashes, lesions, ulcers. No induration Neurologic: CN 2-12 grossly intact. Sensation intact, DTR normal. Strength 5/5 in all 4.  Psychiatric: Normal judgment and insight. Alert and oriented x 3. Normal mood.     Labs on Admission: I have personally reviewed following labs and imaging studies  CBC:  Recent Labs Lab 08/26/16 1643  WBC 11.7*  HGB 12.8  HCT 39.8  MCV 92.6  PLT 281   Basic Metabolic Panel:  Recent Labs Lab 08/26/16 1643  NA 138  K 3.2*  CL 104  CO2 26  GLUCOSE 98  BUN 5*  CREATININE 0.74  CALCIUM 8.5*   GFR: Estimated Creatinine Clearance: 153.4 mL/min (by C-G formula based on SCr of 0.74 mg/dL). Liver Function Tests:  Recent Labs Lab 08/26/16 1643  AST 21  ALT 25  ALKPHOS 67  BILITOT 1.0  PROT 6.7  ALBUMIN 3.1*   No results for input(s): LIPASE, AMYLASE in the last 168 hours. No results for input(s): AMMONIA in the last 168  hours. Coagulation Profile: No results for input(s): INR, PROTIME in the last 168 hours. Cardiac Enzymes: No results for input(s): CKTOTAL, CKMB, CKMBINDEX, TROPONINI in the last 168 hours. BNP (last 3 results) No results for input(s): PROBNP in the last 8760 hours. HbA1C: No results for input(s): HGBA1C in the last 72 hours. CBG: No results for input(s): GLUCAP in the last 168 hours. Lipid Profile: No results for input(s): CHOL, HDL, LDLCALC, TRIG, CHOLHDL, LDLDIRECT in the last 72 hours. Thyroid Function Tests: No results for input(s): TSH, T4TOTAL, FREET4, T3FREE, THYROIDAB in the last 72 hours. Anemia Panel: No results for input(s): VITAMINB12, FOLATE, FERRITIN, TIBC, IRON, RETICCTPCT in the last 72 hours. Urine analysis:    Component Value Date/Time   COLORURINE YELLOW 08/26/2016 1806   APPEARANCEUR CLOUDY (A) 08/26/2016 1806   LABSPEC 1.013  08/26/2016 1806   PHURINE 6.0 08/26/2016 1806   GLUCOSEU NEGATIVE 08/26/2016 1806   HGBUR LARGE (A) 08/26/2016 1806   BILIRUBINUR NEGATIVE 08/26/2016 1806   KETONESUR NEGATIVE 08/26/2016 1806   PROTEINUR NEGATIVE 08/26/2016 1806   UROBILINOGEN 0.2 08/15/2011 1305   NITRITE NEGATIVE 08/26/2016 1806   LEUKOCYTESUR LARGE (A) 08/26/2016 1806   Sepsis Labs: No results found for this or any previous visit (from the past 240 hour(s)).   Radiological Exams on Admission: Dg Chest 2 View  Result Date: 08/26/2016 CLINICAL DATA:  31 year old female with fever, nausea vomiting. EXAM: CHEST  2 VIEW COMPARISON:  Chest radiograph dated 03/08/2013 FINDINGS: The heart size and mediastinal contours are within normal limits. Both lungs are clear. The visualized skeletal structures are unremarkable. IMPRESSION: No active cardiopulmonary disease. Electronically Signed   By: Elgie Collard M.D.   On: 08/26/2016 18:40   Ct Head Wo Contrast  Result Date: 08/26/2016 CLINICAL DATA:  Migraine for 2 days.  Nausea. EXAM: CT HEAD WITHOUT CONTRAST TECHNIQUE:  Contiguous axial images were obtained from the base of the skull through the vertex without intravenous contrast. COMPARISON:  None. FINDINGS: Brain: No acute intracranial abnormality. Specifically, no hemorrhage, hydrocephalus, mass lesion, acute infarction, or significant intracranial injury. Vascular: No hyperdense vessel or unexpected calcification. Skull: No acute calvarial abnormality. Sinuses/Orbits: Visualized paranasal sinuses and mastoids clear. Orbital soft tissues unremarkable. Other: None IMPRESSION: Normal study. Electronically Signed   By: Charlett Nose M.D.   On: 08/26/2016 17:06   Ct Renal Stone Study  Result Date: 08/26/2016 CLINICAL DATA:  31 year old female with low back pain and fever. EXAM: CT ABDOMEN AND PELVIS WITHOUT CONTRAST TECHNIQUE: Multidetector CT imaging of the abdomen and pelvis was performed following the standard protocol without IV contrast. COMPARISON:  Abdominal CT dated 04/06/2016 FINDINGS: Evaluation of this exam is limited in the absence of intravenous contrast. Lower chest: The visualized lung bases are clear. No intra-abdominal free air or free fluid. Hepatobiliary: Diffuse fatty infiltration of the liver. No intrahepatic biliary ductal dilatation. The gallbladder is unremarkable. Pancreas: Unremarkable. No pancreatic ductal dilatation or surrounding inflammatory changes. Spleen: Normal in size without focal abnormality. Adrenals/Urinary Tract: The adrenal glands are unremarkable. There is a 3 mm stone in the distal left ureter close to the left ureterovesical junction. There is mild left hydronephroureter. A 2 mm nonobstructing left renal inferior pole calculus is seen. There is no hydronephrosis or nephrolithiasis on the right. The right ureter and urinary bladder appear unremarkable. Stomach/Bowel: There are small scattered sigmoid diverticula without active inflammatory changes. There is no evidence of bowel obstruction or active inflammation. Normal appendix.  Vascular/Lymphatic: The abdominal aorta and IVC are grossly unremarkable on this noncontrast study. No portal venous gas identified. There is no adenopathy. Reproductive: The uterus is anteverted and grossly unremarkable. The ovaries appear unremarkable as well. No pelvic mass. Other: None Musculoskeletal: No acute or significant osseous findings. IMPRESSION: 1. A 3 mm distal left ureteral stone with mild left hydronephrosis. Correlation with urinalysis recommended to exclude superimposed UTI. A 2 mm nonobstructing left renal inferior pole stone is also noted. 2. Fatty liver. 3. Small scattered colonic diverticula. No bowel obstruction or active inflammation. Normal appendix. Electronically Signed   By: Elgie Collard M.D.   On: 08/26/2016 20:13      Assessment/Plan Sepsis 2/2  pyelonephritis with left ureteral stone with hydronephrosis: Acute. Patient presents with fever of 103.43F, tachycardia, and elevated WBC of 11.7. Lactic acid reassuring at 1.68. UA positive for signs of  infection and CT scan showing a 3 mm left ureteral stone with mild left hydronephrosis. - Admit to MedSurg. - Sepsis protocol was not initially started prior to patient receiving antibiotics and going to OR - Follow-up urine culture - Hydrocodone prn pain - Continue antibiotics of Rocephin - Appreciate urology consultative services - Recheck WBC in a.m.  Nausea and vomiting: Secondary to above. - Zofran when necessary - IVF NS at 100 ml/hr overnight  Hypokalemia: Acute. Initial potassium 3.2 on admission. - Give 40 mEq of potassium chloride -  continue to monitor and replace as needed   Bipolar - Continue Celexa   Morbid Obesity: BMI 41  DVT prophylaxis: Lovenox    Code Status: full Family Communication: Discussed the plan of care with the patient  Disposition Plan: likely discharge home in a.m. Consults called: Urology Admission status: Patient   Clydie Braunondell A Dartha Rozzell MD [facility] Pager [phone]  [phone]  If 7PM-7AM, please contact night-coverage [email or web address] Password [email or web address]  08/26/2016, 9:11 PM

## 2016-08-26 NOTE — Progress Notes (Signed)
Upon arrival to PACU patient shivering.  Oral temp of 102.9, heart rate ST 130s.  BP 131/88.  Amber NormanKaren Kirby-Graham NP [facility]) notified and stat lactic acid ordered.  Will continue to monitor.

## 2016-08-26 NOTE — Anesthesia Postprocedure Evaluation (Signed)
Anesthesia Post Note  Patient: [name]  Procedure(s) Performed: Procedure(s) (LRB): CYSTOSCOPY WITH RETROGRADE PYELOGRAM/URETERAL STENT PLACEMENT (Left)     Patient location during evaluation: PACU Anesthesia Type: General Level of consciousness: awake and alert, oriented and patient cooperative Pain management: pain level controlled Vital Signs Assessment: post-procedure vital signs reviewed and stable Respiratory status: spontaneous breathing, nonlabored ventilation, respiratory function stable and patient connected to nasal cannula oxygen Cardiovascular status: blood pressure returned to baseline and stable Postop Assessment: no signs of nausea or vomiting Anesthetic complications: no    Last Vitals:  Vitals:   08/26/16 2300 08/26/16 2315  BP: 131/70 140/69  Pulse: (!) 107 (!) 105  Resp: 17 (!) 31  Temp:      Last Pain:  Vitals:   08/26/16 1833  TempSrc: Oral  PainSc:                  Kehaulani Fruin,E. Mandel Seiden

## 2016-08-26 NOTE — ED Notes (Signed)
Pt to short stay 36 

## 2016-08-26 NOTE — Op Note (Signed)
Preoperative Diagnosis: Left ureteral stone with sepsis  Postoperative Diagnosis:  Same  Procedure(s) Performed:   1. Cystourethroscopy 2. Left ureteral catheterization and retrograde pyelogram 3. Left ureteral stent placement, 6 Fr x 26 cm double J type, without string 4. Intraoperative fluoroscopy with interpretation <1hr  Surgeons:   Primary: [name], Freddye Cardamone, MD Resident - Assisting: Valora CorporalLomboy, Jason R, MD   Anesthesia:  General via endotracheal tube Anesthesiologist: Jairo BenJackson, Carswell, MD CRNA: Lucinda Dellecarlo, Valerie M, CRNA   Fluids:  See anesthesia record  Estimated blood loss:  Minimal  Specimens:  None  Cultures:  Urine culture, left renal pelvis  Drains:  None  Implant(s): 6 Fr x 26 cm double J type ureteral stent, left ureter  Complications:  None  Indications: 31 y.o. year-old female with history of recurrent nephrolithiasis presenting with left flank pain and fever/tachycardia. She was previously seen in holding where she provided written informed consent for the procedure after discussion of the risks, benefits, and alternatives. All questions answered. She is eager to proceed.  Findings:  Grossly normal cystoscopy. Successful placement of left ureteral stent  Interpretation of Retrograde Pyelogram: Small radiopaque stone seen in distal ureter. Mild to moderate hydronephrosis. Appropriate placement of stent.   Description:  The patient was correctly identified in the preop holding area where written informed consent as well potential risk and complication reviewed. She agreed. They were brought back to the operative suite where a preinduction timeout was performed. Once correct information was verified, anesthesia was induced  via endotracheal tube. They were then gently placed into dorsal lithotomy position with SCDs in place for VTE prophylaxis. They were prepped and draped in the usual sterile fashion and given appropriate preoperative antibiotics with Rocephin. A  second timeout was then performed.   We inserted a 59F rigid cystoscope per urethra with copious lubrication and normal saline irrigation running. This demonstrated findings as described above. We cannulated the left ureteral orifice with a wire and advanced this up to the kidney under fluoroscopic guidance. We then advanced a 5 Fr open ended catheter to perform a retrograde pyelogram. The catheter was exchanged for the wire. We then advanced a 6 Fr x 26 cm double J type ureteral stent without string attached over the wire and up to the kidney under direct vision and fluoroscopic guidance. Upon removal of the wire, there were appropriate curls proximally and distally. Draining urine was clear and not purulent. The bladder was drained and hardware removed. The patient was awoken from anesthesia and taken to PACU in stable condition.  Post Op Plan:   1. Admit patient to hospitalist for ongoing management of sepsis.  2. Urology will follow along. She will require outpatient treatment of ureteral stone to be scheduled after discharge.   Attestation:  Dr. Retta Dionesahlstedt was present for the entire procedure.    Cecille AverJason R. Judie GrieveLomboy, MD PGY4 Urologic Surgery  I personally attended all aspects  of the above procedure.

## 2016-08-26 NOTE — Anesthesia Preprocedure Evaluation (Addendum)
Anesthesia Evaluation  Patient identified by MRN, date of birth, ID band Patient awake    Reviewed: Allergy & Precautions, NPO status , Patient's Chart, lab work & pertinent test resultsPreop documentation limited or incomplete due to emergent nature of procedure.  History of Anesthesia Complications Negative for: history of anesthetic complications  Airway Mallampati: I  TM Distance: >3 FB Neck ROM: Full    Dental  (+) Dental Advisory Given   Pulmonary Current Smoker,    breath sounds clear to auscultation       Cardiovascular (-) hypertension Rhythm:Regular Rate:Normal     Neuro/Psych Anxiety Depression negative neurological ROS     GI/Hepatic neg GERD  ,(+)     substance abuse  marijuana use, N/v with pyelonephritis   Endo/Other  neg diabetesMorbid obesityH/o gestational DM  Renal/GU      Musculoskeletal   Abdominal (+) + obese,   Peds  Hematology   Anesthesia Other Findings   Reproductive/Obstetrics                            Anesthesia Physical Anesthesia Plan  ASA: II and emergent  Anesthesia Plan: General   Post-op Pain Management:    Induction: Intravenous and Rapid sequence  PONV Risk Score and Plan: 3 and Ondansetron, Dexamethasone and Midazolam  Airway Management Planned: Oral ETT  Additional Equipment:   Intra-op Plan:   Post-operative Plan: Extubation in OR  Informed Consent: I have reviewed the patients History and Physical, chart, labs and discussed the procedure including the risks, benefits and alternatives for the proposed anesthesia with the patient or authorized representative who has indicated his/her understanding and acceptance.   Dental advisory given  Plan Discussed with: CRNA and Surgeon  Anesthesia Plan Comments: (Plan routine monitors, GETA)        Anesthesia Quick Evaluation

## 2016-08-26 NOTE — ED Notes (Signed)
Patient transported to X-ray 

## 2016-08-26 NOTE — Anesthesia Procedure Notes (Signed)
Procedure Name: Intubation Date/Time: 08/26/2016 10:01 PM Performed by: Myna Bright Pre-anesthesia Checklist: Patient identified, Emergency Drugs available, Suction available and Patient being monitored Patient Re-evaluated:Patient Re-evaluated prior to inductionOxygen Delivery Method: Circle system utilized Preoxygenation: Pre-oxygenation with 100% oxygen Intubation Type: IV induction, Rapid sequence and Cricoid Pressure applied Laryngoscope Size: Mac and 3 Grade View: Grade I Tube type: Oral Tube size: 7.0 mm Number of attempts: 1 Airway Equipment and Method: Stylet Placement Confirmation: ETT inserted through vocal cords under direct vision,  positive ETCO2 and breath sounds checked- equal and bilateral Secured at: 21 cm Tube secured with: Tape Dental Injury: Teeth and Oropharynx as per pre-operative assessment

## 2016-08-26 NOTE — ED Provider Notes (Signed)
[facility]-EMERGENCY DEPT Provider Note   CSN: [id number] Arrival date & time: 08/26/16  1247     History   Chief Complaint Chief Complaint  Patient presents with  . Fever    HPI Amber Nielsen is a 31 y.o. female with a h/o of HTN and nephrolithiasis who presents to the emergency department with fever with associated malaise, myalgias, neck, and left-sided back pain that began yesterday. She also complains of associated blurried vision and HA onset today. She reports that she also started her period two days ago and complains of severe cramping associated with her period. She denies diarrhea, constipation, dysuria, vaginal pain, vaginal itching, chest pain, dyspnea, or rash. She treated her sx PTA with Tylenol without relief.   PMH includes morbid obesity, MDD, asthma, bipolar disorder. No h/o of surgery 2/2 to nephrolithiasis.  The history is provided by the patient. No language interpreter was used.    Past Medical History:  Diagnosis Date  . Anxiety   . Asthma    allergy related. rodent animals  . Bipolar 1 disorder (HCC)   . Bipolar 1 disorder (HCC)   . Depression   . Dizziness 07/03/2013  . Gestational diabetes    diet controlled  . H/O varicella   . Headache(784.0)   . HTN (hypertension)   . IBS (irritable bowel syndrome)   . Kidney stones   . Obesity   . Ovarian cyst   . Pregnancy induced hypertension   . Proteinuria complicating pregnancy     Patient Active Problem List   Diagnosis Date Noted  . Hypokalemia 08/27/2016  . Ureteral stone with hydronephrosis 08/27/2016  . Acute pyelonephritis   . Sepsis secondary to UTI (HCC) 08/26/2016  . 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 Cannabis abuse 02/02/2016  . Morbid obesity (HCC) 12/15/2012    Past Surgical History:  Procedure Laterality Date  . COLONOSCOPY    . CYSTOSCOPY W/ URETERAL STENT PLACEMENT Left 08/26/2016   Procedure: CYSTOSCOPY WITH RETROGRADE  PYELOGRAM/URETERAL STENT PLACEMENT;  Surgeon: Marcine Matar, MD;  Location: [facility] OR;  Service: Urology;  Laterality: Left;  . ibs    . TOOTH EXTRACTION      OB History    Gravida Para Term Preterm AB Living   3 2 2   1 2    SAB TAB Ectopic Multiple Live Births   1       2       Home Medications    Prior to Admission medications   Medication Sig Start Date End Date Taking? Authorizing Provider  albuterol (PROVENTIL HFA;VENTOLIN HFA) 108 (90 Base) MCG/ACT inhaler Inhale 2 puffs into the lungs every 6 (six) hours as needed for wheezing or shortness of breath.   Yes [provider]  citalopram (CELEXA) 20 MG tablet Take 20 mg by mouth daily.   Yes [provider]  naproxen (NAPROSYN) 500 MG tablet Take 1 tablet (500 mg total) by mouth 2 (two) times daily. 04/06/16  Yes Horton, Mayer Masker, MD    Family History Family History  Problem Relation Age of Onset  . Heart disease Father   . Heart attack Father   . Nephrolithiasis Father   . Colon polyps Father   . Mental retardation Maternal Aunt   . Diabetes Maternal Grandmother   . Cancer Maternal Grandmother        ovarian and breast  . Diabetes Maternal Grandfather   . Diabetes Paternal Grandmother   . Hypertension Paternal  Grandmother   . Heart disease Paternal Grandmother   . Colon polyps Paternal Grandmother   . Diabetes Paternal Grandfather   . Colon polyps Paternal Uncle     Social History Social History  Substance Use Topics  . Smoking status: Current Every Day Smoker    Packs/day: 1.00    Types: Cigarettes    Last attempt to quit: 03/09/2005  . Smokeless tobacco: Current User  . Alcohol use No     Allergies   Celery oil; Cymbalta [duloxetine hcl]; Lamictal [lamotrigine]; and Prozac [fluoxetine hcl]   Review of Systems Review of Systems  Constitutional: Negative for activity change.  HENT: Negative for sore throat.   Eyes: Positive for visual disturbance.  Respiratory: Negative for cough  and shortness of breath.   Cardiovascular: Negative for chest pain and leg swelling.  Gastrointestinal: Positive for nausea and vomiting. Negative for abdominal pain.  Musculoskeletal: Positive for back pain, myalgias and neck pain.  Skin: Negative for rash.  Allergic/Immunologic: Negative for immunocompromised state.  Neurological: Positive for headaches.  Psychiatric/Behavioral: Negative for confusion.   Physical Exam Updated Vital Signs BP (!) 111/56 (BP Location: Left Arm)   Pulse 75   Temp 98.7 F (37.1 C) (Oral)   Resp 16   Ht 5\' 10"  (1.778 m)   Wt 129.3 kg (285 lb)   LMP 08/23/2016 (Exact Date)   SpO2 100%   BMI 40.89 kg/m   Physical Exam  Constitutional: She is oriented to person, place, and time. No distress.  HENT:  Head: Normocephalic.  Eyes: Conjunctivae are normal.  Neck: Normal range of motion. Neck supple.  No meningeal signs.   Cardiovascular: Regular rhythm, normal heart sounds and intact distal pulses.  Tachycardia present.  Exam reveals no gallop and no friction rub.   No murmur heard. Pulmonary/Chest: Effort normal and breath sounds normal. No respiratory distress. She has no wheezes. She has no rales. She exhibits no tenderness.  Abdominal: Soft. Bowel sounds are normal. She exhibits no distension. There is tenderness. There is no guarding.  Diffuse abdominal tenderness.   Musculoskeletal: Normal range of motion. She exhibits no edema or deformity.  Diffuse TTP over the left paraspinal muscles . No bony midline tenderness. Patient is able to ambulate without difficulty. Bilateral hips and knees are non-tender with FROM. 2+ DP and PT pulses. 5/5 strength of the bilateral lower extremities. No sensory deficits.   Neurological: She is alert and oriented to person, place, and time.  Cranial nerves 2-12 intact. Finger-to-nose is normal. 5/5 motor strength of the bilateral upper and lower extremities. Moves all four extremities. Negative Romberg. Ambulatory  without difficulty. NVI.   Skin: Skin is warm. No rash noted.  Psychiatric: Her behavior is normal.  Nursing note and vitals reviewed.   ED Treatments / Results  Labs (all labs ordered are listed, but only abnormal results are displayed) Labs Reviewed  CBC - Abnormal; Notable for the following:       Result Value   WBC 11.7 (*)    All other components within normal limits  COMPREHENSIVE METABOLIC PANEL - Abnormal; Notable for the following:    Potassium 3.2 (*)    BUN 5 (*)    Calcium 8.5 (*)    Albumin 3.1 (*)    All other components within normal limits  URINALYSIS, ROUTINE W REFLEX MICROSCOPIC - Abnormal; Notable for the following:    APPearance CLOUDY (*)    Hgb urine dipstick LARGE (*)    Leukocytes, UA LARGE (*)  Bacteria, UA MANY (*)    Squamous Epithelial / LPF 6-30 (*)    All other components within normal limits  CBC - Abnormal; Notable for the following:    WBC 12.0 (*)    Hemoglobin 11.8 (*)    All other components within normal limits  BASIC METABOLIC PANEL - Abnormal; Notable for the following:    Potassium 3.3 (*)    Glucose, Bld 182 (*)    Calcium 8.0 (*)    All other components within normal limits  LACTIC ACID, PLASMA - Abnormal; Notable for the following:    Lactic Acid, Venous 2.7 (*)    All other components within normal limits  URINE CULTURE  CULTURE, BLOOD (ROUTINE X 2)  CULTURE, BLOOD (ROUTINE X 2)  LACTIC ACID, PLASMA  MAGNESIUM  COMPREHENSIVE METABOLIC PANEL  CBC  I-STAT CG4 LACTIC ACID, ED  POC URINE PREG, ED  I-STAT CG4 LACTIC ACID, ED    EKG  EKG Interpretation None       Radiology Dg Chest 2 View  Result Date: 08/26/2016 CLINICAL DATA:  31 year old female with fever, nausea vomiting. EXAM: CHEST  2 VIEW COMPARISON:  Chest radiograph dated 03/08/2013 FINDINGS: The heart size and mediastinal contours are within normal limits. Both lungs are clear. The visualized skeletal structures are unremarkable. IMPRESSION: No active  cardiopulmonary disease. Electronically Signed   By: Elgie Collard M.D.   On: 08/26/2016 18:40   Ct Head Wo Contrast  Result Date: 08/26/2016 CLINICAL DATA:  Migraine for 2 days.  Nausea. EXAM: CT HEAD WITHOUT CONTRAST TECHNIQUE: Contiguous axial images were obtained from the base of the skull through the vertex without intravenous contrast. COMPARISON:  None. FINDINGS: Brain: No acute intracranial abnormality. Specifically, no hemorrhage, hydrocephalus, mass lesion, acute infarction, or significant intracranial injury. Vascular: No hyperdense vessel or unexpected calcification. Skull: No acute calvarial abnormality. Sinuses/Orbits: Visualized paranasal sinuses and mastoids clear. Orbital soft tissues unremarkable. Other: None IMPRESSION: Normal study. Electronically Signed   By: Charlett Nose M.D.   On: 08/26/2016 17:06   Dg Cystogram  Result Date: 08/27/2016 CLINICAL DATA:  Intraoperative cystogram EXAM: CYSTOGRAM COMPARISON:  None. FINDINGS: Cystogram was performed by Dr. Retta Diones. No images were saved for interpretation. IMPRESSION: As above. Electronically Signed   By: Charlett Nose M.D.   On: 08/27/2016 08:13   Ct Renal Stone Study  Result Date: 08/26/2016 CLINICAL DATA:  31 year old female with low back pain and fever. EXAM: CT ABDOMEN AND PELVIS WITHOUT CONTRAST TECHNIQUE: Multidetector CT imaging of the abdomen and pelvis was performed following the standard protocol without IV contrast. COMPARISON:  Abdominal CT dated 04/06/2016 FINDINGS: Evaluation of this exam is limited in the absence of intravenous contrast. Lower chest: The visualized lung bases are clear. No intra-abdominal free air or free fluid. Hepatobiliary: Diffuse fatty infiltration of the liver. No intrahepatic biliary ductal dilatation. The gallbladder is unremarkable. Pancreas: Unremarkable. No pancreatic ductal dilatation or surrounding inflammatory changes. Spleen: Normal in size without focal abnormality. Adrenals/Urinary  Tract: The adrenal glands are unremarkable. There is a 3 mm stone in the distal left ureter close to the left ureterovesical junction. There is mild left hydronephroureter. A 2 mm nonobstructing left renal inferior pole calculus is seen. There is no hydronephrosis or nephrolithiasis on the right. The right ureter and urinary bladder appear unremarkable. Stomach/Bowel: There are small scattered sigmoid diverticula without active inflammatory changes. There is no evidence of bowel obstruction or active inflammation. Normal appendix. Vascular/Lymphatic: The abdominal aorta and IVC are grossly unremarkable  on this noncontrast study. No portal venous gas identified. There is no adenopathy. Reproductive: The uterus is anteverted and grossly unremarkable. The ovaries appear unremarkable as well. No pelvic mass. Other: None Musculoskeletal: No acute or significant osseous findings. IMPRESSION: 1. A 3 mm distal left ureteral stone with mild left hydronephrosis. Correlation with urinalysis recommended to exclude superimposed UTI. A 2 mm nonobstructing left renal inferior pole stone is also noted. 2. Fatty liver. 3. Small scattered colonic diverticula. No bowel obstruction or active inflammation. Normal appendix. Electronically Signed   By: Elgie Collard M.D.   On: 08/26/2016 20:13    Procedures Procedures (including critical care time)  Medications Ordered in ED Medications  potassium chloride SA (K-DUR,KLOR-CON) CR tablet 40 mEq ( Oral MAR Unhold 08/27/16 0004)  HYDROcodone-acetaminophen (NORCO/VICODIN) 5-325 MG per tablet 1-2 tablet (not administered)  citalopram (CELEXA) tablet 20 mg (20 mg Oral Given 08/27/16 0839)  traZODone (DESYREL) tablet 25 mg (not administered)  enoxaparin (LOVENOX) injection 40 mg (40 mg Subcutaneous Given 08/27/16 1335)  ondansetron (ZOFRAN) tablet 4 mg (not administered)    Or  ondansetron (ZOFRAN) injection 4 mg (not administered)  acetaminophen (TYLENOL) tablet 650 mg (650 mg  Oral Given 08/27/16 1648)    Or  acetaminophen (TYLENOL) suppository 650 mg ( Rectal See Alternative 08/27/16 1648)  albuterol (PROVENTIL) (2.5 MG/3ML) 0.083% nebulizer solution 2.5 mg (not administered)  morphine 4 MG/ML injection 2 mg (2 mg Intravenous Given 08/27/16 2105)  0.9 % NaCl with KCl 20 mEq/ L  infusion ( Intravenous New Bag/Given 08/27/16 1635)  cefTRIAXone (ROCEPHIN) 2 g in dextrose 5 % 50 mL IVPB (0 g Intravenous Stopped 08/27/16 1811)  phenazopyridine (PYRIDIUM) tablet 200 mg (200 mg Oral Given 08/27/16 1741)  loratadine (CLARITIN) tablet 10 mg (10 mg Oral Given 08/27/16 1401)  MUSCLE RUB CREA 1 application (not administered)  acetaminophen (TYLENOL) tablet 650 mg ( Oral Duplicate 08/26/16 1300)  ondansetron (ZOFRAN-ODT) disintegrating tablet 4 mg (4 mg Oral Given 08/26/16 1724)  acetaminophen (TYLENOL) tablet 650 mg (650 mg Oral Given 08/26/16 1758)  sodium chloride 0.9 % bolus 1,000 mL (0 mLs Intravenous Stopped 08/26/16 1929)  prochlorperazine (COMPAZINE) injection 10 mg (10 mg Intravenous Given 08/26/16 1926)  ketorolac (TORADOL) 30 MG/ML injection 30 mg (30 mg Intravenous Given 08/26/16 1925)  diphenhydrAMINE (BENADRYL) injection 25 mg (25 mg Intravenous Given 08/26/16 1926)  sodium chloride 0.9 % bolus 1,000 mL (1,000 mLs Intravenous New Bag/Given 08/26/16 1951)  cefTRIAXone (ROCEPHIN) 1 g in dextrose 5 % 50 mL IVPB (0 g Intravenous Stopped 08/26/16 2058)  sodium chloride 0.9 % bolus 1,000 mL (0 mLs Intravenous Stopped 08/27/16 0143)     Initial Impression / Assessment and Plan / ED Course  I have reviewed the triage vital signs and the nursing notes.  Pertinent labs & imaging results that were available during my care of the patient were reviewed by me and considered in my medical decision making (see chart for details).  CRITICAL CARE Performed by: Barkley Boards Total critical care time: 30 minutes Critical care time was exclusive of separately billable procedures and treating  other patients. Critical care was necessary to treat or prevent imminent or life-threatening deterioration. Critical care was time spent personally by me on the following activities: development of treatment plan with patient and/or surrogate as well as nursing, discussions with consultants, evaluation of patient's response to treatment, examination of patient, obtaining history from patient or surrogate, ordering and performing treatments and interventions, ordering and review of laboratory studies,  ordering and review of radiographic studies, pulse oximetry and re-evaluation of patient's condition.      Patient with a h/o of nephrolithiasis presenting with fever of 103.4 and >102 despite tylenol administration. Lactate 1.68. Leukocytosis at 11.7. Patient c/o stiff neck, visual changes, and HA. CT head negative. No h/o of recent time spent outdoors, pets, or tick bites. No rash; unlikely tickborne illness. Source of infections unknown. Oropharynx unremarkable, unlikely streptococcal infection. CXR negative for acute processes.  UA suspicious for UTI despite UTI symptoms CT renal stone study demonstrating left-sided hydronephrosis and nephrolithiasis. Discussed the patient with Dr. Corlis LeakMackuen, attending physician. Will consult urology for sepsis 2/2 to pyelonephritis with left ureteral stone with hydronephrosis. Patient care transferred to Dr. Corlis LeakMackuen at the end of my shift. Patient presentation, ED course, and plan of care discussed with review of all pertinent labs and imaging. Please see his/her note for further details regarding further ED course and disposition.   Final Clinical Impressions(s) / ED Diagnoses   Final diagnoses:  Kidney stone  Acute pyelonephritis    New Prescriptions Current Discharge Medication List       Barkley BoardsMcDonald, Mia A, PA-C 08/28/16 0026    McDonald, Mia A, PA-C 08/28/16 0026    Abelino DerrickMackuen, Courteney Lyn, MD 08/28/16 1523

## 2016-08-26 NOTE — Consult Note (Signed)
New Consult Note  Requesting Physician: Abelino DerrickMackuen, Courteney Lyn, *  Service Requesting Consult: Emergency Dept  Urology Consult Attending: Takeya Marquis Reason for Consult:  Infected stone  Subjective: Amber Nielsen is seen in consultation for reasons noted above. This is a 31 yo patient with a history of anxiety/depression, bipolar disorder, hypertension, IBS, obesity, and nephrolithiasis presenting with left sided back pain since yesterday. Has had associated fevers and malaise, poor PO intake. Nothing has improved the pain. She has had prior kidney stones but has never required surgery. She is not pregnant. She had a head CT that was negative.   Past Medical History: Past Medical History:  Diagnosis Date  . Anxiety   . Asthma    allergy related. rodent animals  . Bipolar 1 disorder (HCC)   . Bipolar 1 disorder (HCC)   . Depression   . Dizziness 07/03/2013  . Gestational diabetes    diet controlled  . H/O varicella   . Headache(784.0)   . HTN (hypertension)   . IBS (irritable bowel syndrome)   . Kidney stones   . Obesity   . Ovarian cyst   . Pregnancy induced hypertension   . Proteinuria complicating pregnancy     Past Surgical History:  Past Surgical History:  Procedure Laterality Date  . COLONOSCOPY    . ibs    . TOOTH EXTRACTION      Medication: Current Facility-Administered Medications  Medication Dose Route Frequency Provider Last Rate Last Dose  . cefTRIAXone (ROCEPHIN) 1 g in dextrose 5 % 50 mL IVPB  1 g Intravenous Once Mackuen, Courteney Lyn, MD 100 mL/hr at 08/26/16 2028 1 g at 08/26/16 2028   Current Outpatient Prescriptions  Medication Sig Dispense Refill  . cephALEXin (KEFLEX) 500 MG capsule Take 1 capsule (500 mg total) by mouth 3 (three) times daily. 21 capsule 0  . citalopram (CELEXA) 20 MG tablet Take 20 mg by mouth daily.    Marland Kitchen. HYDROcodone-acetaminophen (NORCO/VICODIN) 5-325 MG tablet Take 1-2 tablets by mouth every 6 (six) hours as needed. 10  tablet 0  . naproxen (NAPROSYN) 500 MG tablet Take 1 tablet (500 mg total) by mouth 2 (two) times daily. 30 tablet 0  . traZODone (DESYREL) 50 MG tablet Take 0.5 tablets (25 mg total) by mouth at bedtime as needed for sleep. 30 tablet 0    Allergies: Allergies  Allergen Reactions  . Celery Oil Swelling    Tongue swelling when eating celery  . Cymbalta [Duloxetine Hcl] Other (See Comments)    "out of body"  . Lamictal [Lamotrigine] Other (See Comments)    unknown  . Prozac [Fluoxetine Hcl] Other (See Comments)    Anger problems    Social History: Social History  Substance Use Topics  . Smoking status: Current Every Day Smoker    Packs/day: 1.00    Types: Cigarettes    Last attempt to quit: 03/09/2005  . Smokeless tobacco: Current User  . Alcohol use No    Family History Family History  Problem Relation Age of Onset  . Heart disease Father   . Heart attack Father   . Nephrolithiasis Father   . Colon polyps Father   . Mental retardation Maternal Aunt   . Diabetes Maternal Grandmother   . Cancer Maternal Grandmother        ovarian and breast  . Diabetes Maternal Grandfather   . Diabetes Paternal Grandmother   . Hypertension Paternal Grandmother   . Heart disease Paternal Grandmother   . Colon  polyps Paternal Grandmother   . Diabetes Paternal Grandfather   . Colon polyps Paternal Uncle     Review of Systems 10 systems were reviewed and are negative except as noted specifically in the HPI.  Objective: Vital signs in last 24 hours: BP 123/65   Pulse 95   Temp (!) 102 F (38.9 C) (Oral)   Resp 16   Ht 5\' 10"  (1.778 m)   Wt 129.3 kg (285 lb)   LMP 08/23/2016 (Exact Date)   SpO2 97%   BMI 40.89 kg/m   Intake/Output last 3 shifts: No intake/output data recorded.  Physical Exam General: NAD, resting, appropriate HEENT: [place]/AT, EOMI, MMM Pulmonary: Normal work of breathing on RA Cardiovascular: Regular rate & rhythm, HDS, adequate peripheral  perfusion Abdomen: soft, tenderness left abdomen, nondistended GU: left CVA tenderness Extremities: warm and well perfused, no edema Neuro: Appropriate, no focal neurological deficits  Most Recent Labs: Lab Results  Component Value Date   WBC 11.7 (H) 08/26/2016   HGB 12.8 08/26/2016   HCT 39.8 08/26/2016   PLT 281 08/26/2016    Lab Results  Component Value Date   NA 138 08/26/2016   K 3.2 (L) 08/26/2016   CL 104 08/26/2016   CO2 26 08/26/2016   BUN 5 (L) 08/26/2016   CREATININE 0.74 08/26/2016   CALCIUM 8.5 (L) 08/26/2016    Lab Results  Component Value Date   ALKPHOS 67 08/26/2016   BILITOT 1.0 08/26/2016   PROT 6.7 08/26/2016   ALBUMIN 3.1 (L) 08/26/2016   ALT 25 08/26/2016   AST 21 08/26/2016    No results found for: INR, APTT  IMAGING: Dg Chest 2 View  Result Date: 08/26/2016 CLINICAL DATA:  31 year old female with fever, nausea vomiting. EXAM: CHEST  2 VIEW COMPARISON:  Chest radiograph dated 03/08/2013 FINDINGS: The heart size and mediastinal contours are within normal limits. Both lungs are clear. The visualized skeletal structures are unremarkable. IMPRESSION: No active cardiopulmonary disease. Electronically Signed   By: Elgie Collard M.D.   On: 08/26/2016 18:40   Ct Head Wo Contrast  Result Date: 08/26/2016 CLINICAL DATA:  Migraine for 2 days.  Nausea. EXAM: CT HEAD WITHOUT CONTRAST TECHNIQUE: Contiguous axial images were obtained from the base of the skull through the vertex without intravenous contrast. COMPARISON:  None. FINDINGS: Brain: No acute intracranial abnormality. Specifically, no hemorrhage, hydrocephalus, mass lesion, acute infarction, or significant intracranial injury. Vascular: No hyperdense vessel or unexpected calcification. Skull: No acute calvarial abnormality. Sinuses/Orbits: Visualized paranasal sinuses and mastoids clear. Orbital soft tissues unremarkable. Other: None IMPRESSION: Normal study. Electronically Signed   By: Charlett Nose  M.D.   On: 08/26/2016 17:06   Ct Renal Stone Study  Result Date: 08/26/2016 CLINICAL DATA:  31 year old female with low back pain and fever. EXAM: CT ABDOMEN AND PELVIS WITHOUT CONTRAST TECHNIQUE: Multidetector CT imaging of the abdomen and pelvis was performed following the standard protocol without IV contrast. COMPARISON:  Abdominal CT dated 04/06/2016 FINDINGS: Evaluation of this exam is limited in the absence of intravenous contrast. Lower chest: The visualized lung bases are clear. No intra-abdominal free air or free fluid. Hepatobiliary: Diffuse fatty infiltration of the liver. No intrahepatic biliary ductal dilatation. The gallbladder is unremarkable. Pancreas: Unremarkable. No pancreatic ductal dilatation or surrounding inflammatory changes. Spleen: Normal in size without focal abnormality. Adrenals/Urinary Tract: The adrenal glands are unremarkable. There is a 3 mm stone in the distal left ureter close to the left ureterovesical junction. There is mild left hydronephroureter. A  2 mm nonobstructing left renal inferior pole calculus is seen. There is no hydronephrosis or nephrolithiasis on the right. The right ureter and urinary bladder appear unremarkable. Stomach/Bowel: There are small scattered sigmoid diverticula without active inflammatory changes. There is no evidence of bowel obstruction or active inflammation. Normal appendix. Vascular/Lymphatic: The abdominal aorta and IVC are grossly unremarkable on this noncontrast study. No portal venous gas identified. There is no adenopathy. Reproductive: The uterus is anteverted and grossly unremarkable. The ovaries appear unremarkable as well. No pelvic mass. Other: None Musculoskeletal: No acute or significant osseous findings. IMPRESSION: 1. A 3 mm distal left ureteral stone with mild left hydronephrosis. Correlation with urinalysis recommended to exclude superimposed UTI. A 2 mm nonobstructing left renal inferior pole stone is also noted. 2. Fatty  liver. 3. Small scattered colonic diverticula. No bowel obstruction or active inflammation. Normal appendix. Electronically Signed   By: Elgie Collard M.D.   On: 08/26/2016 20:13     Assessment: Patient is a 31 y.o. female with history of nephrolithiasis presenting with left flank pain and fever secondary to a left 3mm distal ureteral stone with sepsis. Actively febrile and tachycardic in the ED.  We discussed the recommended management for drainage of the kidney by ureteral stent placement. Discussed the procedure details, risks, benefits, alternatives, and complications. She is eager to proceed.   Recommendations: 1. OR for emergent cysto left ureteral stent placement 2. Admit to hospitalist medicine for management of sepsis.  3. Patient will require outpatient ureteral stone extraction to be scheduled after discharge.  Discussed with Dr. Retta Diones. Thank you for this consult. Please do not hesitate to contact us with any further questions/concerns.  Buck Mam, MD PGY4 Urology Resident  I have interviewed the pt and agree with the above assessment and plan

## 2016-08-26 NOTE — Transfer of Care (Signed)
Immediate Anesthesia Transfer of Care Note  Patient: [name]  Procedure(s) Performed: Procedure(s): CYSTOSCOPY WITH RETROGRADE PYELOGRAM/URETERAL STENT PLACEMENT (Left)  Patient Location: PACU  Anesthesia Type:General  Level of Consciousness: awake, alert , oriented and patient cooperative  Airway & Oxygen Therapy: Patient Spontanous Breathing and Patient connected to face mask oxygen  Post-op Assessment: Report given to RN, Post -op Vital signs reviewed and stable and Patient moving all extremities  Post vital signs: Reviewed and stable  Last Vitals:  Vitals:   08/26/16 2031 08/26/16 2055  BP: 116/64 119/65  Pulse: 99 89  Resp:  16  Temp:      Last Pain:  Vitals:   08/26/16 1833  TempSrc: Oral  PainSc:          Complications: No apparent anesthesia complications

## 2016-08-27 ENCOUNTER — Encounter (HOSPITAL_COMMUNITY): Payer: Self-pay | Admitting: Urology

## 2016-08-27 DIAGNOSIS — N10 Acute tubulo-interstitial nephritis: Secondary | ICD-10-CM

## 2016-08-27 DIAGNOSIS — N132 Hydronephrosis with renal and ureteral calculous obstruction: Secondary | ICD-10-CM

## 2016-08-27 DIAGNOSIS — E876 Hypokalemia: Secondary | ICD-10-CM | POA: Diagnosis not present

## 2016-08-27 DIAGNOSIS — A419 Sepsis, unspecified organism: Secondary | ICD-10-CM | POA: Diagnosis not present

## 2016-08-27 DIAGNOSIS — N390 Urinary tract infection, site not specified: Secondary | ICD-10-CM | POA: Diagnosis not present

## 2016-08-27 HISTORY — DX: Hydronephrosis with renal and ureteral calculous obstruction: N13.2

## 2016-08-27 LAB — BASIC METABOLIC PANEL
Anion gap: 5 (ref 5–15)
BUN: 7 mg/dL (ref 6–20)
CO2: 25 mmol/L (ref 22–32)
Calcium: 8 mg/dL — ABNORMAL LOW (ref 8.9–10.3)
Chloride: 108 mmol/L (ref 101–111)
Creatinine, Ser: 0.71 mg/dL (ref 0.44–1.00)
GFR calc Af Amer: >60 mL/min (ref >60)
GFR calc non Af Amer: >60 mL/min (ref >60)
Glucose, Bld: 182 mg/dL — ABNORMAL HIGH (ref 65–99)
Potassium: 3.3 mmol/L — ABNORMAL LOW (ref 3.5–5.1)
Sodium: 138 mmol/L (ref 135–145)

## 2016-08-27 LAB — LACTIC ACID, PLASMA
Lactic Acid, Venous: 2.7 mmol/L (ref 0.5–1.9)
Lactic Acid, Venous: 1.1 mmol/L (ref 0.5–1.9)

## 2016-08-27 LAB — CBC
HCT: 36.4 % (ref 36.0–46.0)
Hemoglobin: 11.8 g/dL — ABNORMAL LOW (ref 12.0–15.0)
MCH: 29.8 pg (ref 26.0–34.0)
MCHC: 32.4 g/dL (ref 30.0–36.0)
MCV: 91.9 fL (ref 78.0–100.0)
Platelets: 257 10*3/uL (ref 150–400)
RBC: 3.96 MIL/uL (ref 3.87–5.11)
RDW: 13.2 % (ref 11.5–15.5)
WBC: 12 10*3/uL — ABNORMAL HIGH (ref 4.0–10.5)

## 2016-08-27 LAB — MAGNESIUM: Magnesium: 2 mg/dL (ref 1.7–2.4)

## 2016-08-27 MED ORDER — PHENAZOPYRIDINE HCL 200 MG PO TABS
200.0000 mg | ORAL_TABLET | Freq: Three times a day (TID) | ORAL | Status: DC
  Administered 2016-08-27 – 2016-08-29 (×7): 200 mg via ORAL
  Filled 2016-08-27 (×8): qty 1

## 2016-08-27 MED ORDER — MUSCLE RUB 10-15 % EX CREA
1.0000 "application " | TOPICAL_CREAM | CUTANEOUS | Status: DC | PRN
  Filled 2016-08-27: qty 85

## 2016-08-27 MED ORDER — LORATADINE 10 MG PO TABS
10.0000 mg | ORAL_TABLET | Freq: Every day | ORAL | Status: DC | PRN
  Administered 2016-08-27: 10 mg via ORAL
  Filled 2016-08-27: qty 1

## 2016-08-27 MED ORDER — SODIUM CHLORIDE 0.9 % IV BOLUS (SEPSIS)
1000.0000 mL | Freq: Once | INTRAVENOUS | Status: AC
  Administered 2016-08-27: 1000 mL via INTRAVENOUS

## 2016-08-27 MED ORDER — DEXTROSE 5 % IV SOLN
2.0000 g | INTRAVENOUS | Status: DC
  Administered 2016-08-27 – 2016-08-29 (×3): 2 g via INTRAVENOUS
  Filled 2016-08-27 (×4): qty 2

## 2016-08-27 MED ORDER — MORPHINE SULFATE (PF) 4 MG/ML IV SOLN
2.0000 mg | INTRAVENOUS | Status: DC | PRN
  Administered 2016-08-27 – 2016-08-30 (×5): 2 mg via INTRAVENOUS
  Filled 2016-08-27 (×5): qty 1

## 2016-08-27 MED ORDER — POTASSIUM CHLORIDE IN NACL 20-0.9 MEQ/L-% IV SOLN
INTRAVENOUS | Status: DC
  Administered 2016-08-27 – 2016-08-28 (×4): via INTRAVENOUS
  Filled 2016-08-27 (×4): qty 1000

## 2016-08-27 NOTE — Plan of Care (Signed)
Problem: Safety: Goal: Ability to remain free from injury will improve Outcome: Progressing No falls during this admission. Call bell within reach. Bed in low and locked position. Clean and clear environment maintained. Nonskid footwear being utilized. 3/4 siderails in use. Patient verbalized understanding of safety instruction. Family at bedside.  Problem: Pain Management: Goal: General experience of comfort will improve Outcome: Progressing Patient denies pain at this time. Vital signs are stable.

## 2016-08-27 NOTE — Plan of Care (Signed)
Problem: Pain Managment: Goal: General experience of comfort will improve Outcome: Progressing Pt started on pyridium for discomfort while voiding, controlled with prn pain meds

## 2016-08-27 NOTE — Progress Notes (Signed)
[facility] PROGRESS NOTE  Amber MinorKellie S Nielsen ZOX:[id number] DOB: 05/04/1985 DOA: 08/26/2016   PCP: Cain SaupeFulp, Cammie, MD     Assessment/Plan: Principal Problem:   Sepsis secondary to UTI [facility](HCC) Active Problems:   Morbid obesity (HCC)   Hypokalemia   Ureteral stone with hydronephrosis   Acute pyelonephritis   31 y.o. female with medical history significant of HTN, asthma, bipolar disorder, obesity, nephrolithiasis and gestational diabetes diet controlled; who presents with complaints of fever and headache  6/20. Patient found to be tachycardic and febrile. Found to have a ureteral stone, status post cystoscopy and left ureteral stent placement.  Assessment and plan  Sepsis secondary to pyelonephritis complicated by nephrolithiasis 2/2  pyelonephritis with left ureteral stone with hydronephrosis: Initially had 103.42F, tachycardia, and elevated WBC of 11.7. Lactic acid reassuring at 1.68. UA positive for signs of infection and CT scan showing a 3 mm left ureteral stone with mild left hydronephrosis. - Status post cystourethroscopy, left ureteral catheterization and stent placement by  [name], Stephen, MD - Continue inpatient, IV fluids - Follow-up urine culture - Hydrocodone prn pain - Continue Rocephin, increased to 2 g daily given complicated UTI - Appreciate urology consultative services Pyridium 200 mg 3 times a day for 2 days  Nausea and vomiting: Secondary to above. - Zofran when necessary   Asthma Stable   Hypokalemia: Replete, check magnesium   Bipolar - Continue Celexa   Morbid Obesity: BMI 41    DVT prophylaxsis Lovenox  Code Status:  Full code    Family Communication: Discussed in detail with the patient, all imaging results, lab results explained to the patient   Disposition Plan:  1-2 days      Consultants:  Urology  Procedures:  None  Antibiotics: Anti-infectives    Start     Dose/Rate Route Frequency Ordered Stop   08/27/16  1800  cefTRIAXone (ROCEPHIN) 1 g in dextrose 5 % 50 mL IVPB     1 g 100 mL/hr over 30 Minutes Intravenous Every 24 hours 08/26/16 2222     08/26/16 2030  cefTRIAXone (ROCEPHIN) 1 g in dextrose 5 % 50 mL IVPB     1 g 100 mL/hr over 30 Minutes Intravenous  Once 08/26/16 2020 08/26/16 2058         HPI/Subjective: Continues to have low-grade fever, blood pressure soft, Complaining of burning sensation when she urinates  Objective: Vitals:   08/26/16 2345 08/27/16 0011 08/27/16 0550 08/27/16 0632  BP: 132/71 124/67 (!) 93/49 112/60  Pulse: (!) 104 91 (!) 58 60  Resp: (!) 25 16 16    Temp: (!) 100.4 F (38 C) 99 F (37.2 C) 97.9 F (36.6 C)   TempSrc:  Oral Oral   SpO2: 95% 96% 97%   Weight:      Height:        Intake/Output Summary (Last 24 hours) at 08/27/16 0817 Last data filed at 08/27/16 0659  Gross per 24 hour  Intake          2813.33 ml  Output              155 ml  Net          2658.33 ml    Exam:  Examination:  General exam: Appears calm and comfortable  Respiratory system: Clear to auscultation. Respiratory effort normal. Cardiovascular system: S1 & S2 heard, RRR. No JVD, murmurs, rubs, gallops or clicks. No pedal edema. Gastrointestinal system: Left-sided flank pain, otherwise soft and  nontender. No organomegaly or masses felt. Normal bowel sounds heard. Central nervous system: Alert and oriented. No focal neurological deficits. Extremities: Symmetric 5 x 5 power. Skin: No rashes, lesions or ulcers Psychiatry: Judgement and insight appear normal. Mood & affect appropriate.     Data Reviewed: I have personally reviewed following labs and imaging studies  Micro Results No results found for this or any previous visit (from the past 240 hour(s)).  Radiology Reports Dg Chest 2 View  Result Date: 08/26/2016 CLINICAL DATA:  31 year old female with fever, nausea vomiting. EXAM: CHEST  2 VIEW COMPARISON:  Chest radiograph dated 03/08/2013 FINDINGS: The heart  size and mediastinal contours are within normal limits. Both lungs are clear. The visualized skeletal structures are unremarkable. IMPRESSION: No active cardiopulmonary disease. Electronically Signed   By: Elgie Collard M.D.   On: 08/26/2016 18:40   Ct Head Wo Contrast  Result Date: 08/26/2016 CLINICAL DATA:  Migraine for 2 days.  Nausea. EXAM: CT HEAD WITHOUT CONTRAST TECHNIQUE: Contiguous axial images were obtained from the base of the skull through the vertex without intravenous contrast. COMPARISON:  None. FINDINGS: Brain: No acute intracranial abnormality. Specifically, no hemorrhage, hydrocephalus, mass lesion, acute infarction, or significant intracranial injury. Vascular: No hyperdense vessel or unexpected calcification. Skull: No acute calvarial abnormality. Sinuses/Orbits: Visualized paranasal sinuses and mastoids clear. Orbital soft tissues unremarkable. Other: None IMPRESSION: Normal study. Electronically Signed   By: Charlett Nose M.D.   On: 08/26/2016 17:06   Dg Cystogram  Result Date: 08/27/2016 CLINICAL DATA:  Intraoperative cystogram EXAM: CYSTOGRAM COMPARISON:  None. FINDINGS: Cystogram was performed by Dr. Retta Diones. No images were saved for interpretation. IMPRESSION: As above. Electronically Signed   By: Charlett Nose M.D.   On: 08/27/2016 08:13   Ct Renal Stone Study  Result Date: 08/26/2016 CLINICAL DATA:  31 year old female with low back pain and fever. EXAM: CT ABDOMEN AND PELVIS WITHOUT CONTRAST TECHNIQUE: Multidetector CT imaging of the abdomen and pelvis was performed following the standard protocol without IV contrast. COMPARISON:  Abdominal CT dated 04/06/2016 FINDINGS: Evaluation of this exam is limited in the absence of intravenous contrast. Lower chest: The visualized lung bases are clear. No intra-abdominal free air or free fluid. Hepatobiliary: Diffuse fatty infiltration of the liver. No intrahepatic biliary ductal dilatation. The gallbladder is unremarkable. Pancreas:  Unremarkable. No pancreatic ductal dilatation or surrounding inflammatory changes. Spleen: Normal in size without focal abnormality. Adrenals/Urinary Tract: The adrenal glands are unremarkable. There is a 3 mm stone in the distal left ureter close to the left ureterovesical junction. There is mild left hydronephroureter. A 2 mm nonobstructing left renal inferior pole calculus is seen. There is no hydronephrosis or nephrolithiasis on the right. The right ureter and urinary bladder appear unremarkable. Stomach/Bowel: There are small scattered sigmoid diverticula without active inflammatory changes. There is no evidence of bowel obstruction or active inflammation. Normal appendix. Vascular/Lymphatic: The abdominal aorta and IVC are grossly unremarkable on this noncontrast study. No portal venous gas identified. There is no adenopathy. Reproductive: The uterus is anteverted and grossly unremarkable. The ovaries appear unremarkable as well. No pelvic mass. Other: None Musculoskeletal: No acute or significant osseous findings. IMPRESSION: 1. A 3 mm distal left ureteral stone with mild left hydronephrosis. Correlation with urinalysis recommended to exclude superimposed UTI. A 2 mm nonobstructing left renal inferior pole stone is also noted. 2. Fatty liver. 3. Small scattered colonic diverticula. No bowel obstruction or active inflammation. Normal appendix. Electronically Signed   By: Ceasar Mons.D.  On: 08/26/2016 20:13     CBC  Recent Labs Lab 08/26/16 1643 08/27/16 0320  WBC 11.7* 12.0*  HGB 12.8 11.8*  HCT 39.8 36.4  PLT 281 257  MCV 92.6 91.9  MCH 29.8 29.8  MCHC 32.2 32.4  RDW 13.3 13.2    Chemistries   Recent Labs Lab 08/26/16 1643 08/27/16 0320  NA 138 138  K 3.2* 3.3*  CL 104 108  CO2 26 25  GLUCOSE 98 182*  BUN 5* 7  CREATININE 0.74 0.71  CALCIUM 8.5* 8.0*  AST 21  --   ALT 25  --   ALKPHOS 67  --   BILITOT 1.0  --     ------------------------------------------------------------------------------------------------------------------ estimated creatinine clearance is 153.4 mL/min (by C-G formula based on SCr of 0.71 mg/dL). ------------------------------------------------------------------------------------------------------------------ No results for input(s): HGBA1C in the last 72 hours. ------------------------------------------------------------------------------------------------------------------ No results for input(s): CHOL, HDL, LDLCALC, TRIG, CHOLHDL, LDLDIRECT in the last 72 hours. ------------------------------------------------------------------------------------------------------------------ No results for input(s): TSH, T4TOTAL, T3FREE, THYROIDAB in the last 72 hours.  Invalid input(s): FREET3 ------------------------------------------------------------------------------------------------------------------ No results for input(s): VITAMINB12, FOLATE, FERRITIN, TIBC, IRON, RETICCTPCT in the last 72 hours.  Coagulation profile No results for input(s): INR, PROTIME in the last 168 hours.  No results for input(s): DDIMER in the last 72 hours.  Cardiac Enzymes No results for input(s): CKMB, TROPONINI, MYOGLOBIN in the last 168 hours.  Invalid input(s): CK ------------------------------------------------------------------------------------------------------------------ Invalid input(s): POCBNP   CBG: No results for input(s): GLUCAP in the last 168 hours.     Studies: Dg Chest 2 View  Result Date: 08/26/2016 CLINICAL DATA:  31 year old female with fever, nausea vomiting. EXAM: CHEST  2 VIEW COMPARISON:  Chest radiograph dated 03/08/2013 FINDINGS: The heart size and mediastinal contours are within normal limits. Both lungs are clear. The visualized skeletal structures are unremarkable. IMPRESSION: No active cardiopulmonary disease. Electronically Signed   By: Elgie Collard M.D.   On:  08/26/2016 18:40   Ct Head Wo Contrast  Result Date: 08/26/2016 CLINICAL DATA:  Migraine for 2 days.  Nausea. EXAM: CT HEAD WITHOUT CONTRAST TECHNIQUE: Contiguous axial images were obtained from the base of the skull through the vertex without intravenous contrast. COMPARISON:  None. FINDINGS: Brain: No acute intracranial abnormality. Specifically, no hemorrhage, hydrocephalus, mass lesion, acute infarction, or significant intracranial injury. Vascular: No hyperdense vessel or unexpected calcification. Skull: No acute calvarial abnormality. Sinuses/Orbits: Visualized paranasal sinuses and mastoids clear. Orbital soft tissues unremarkable. Other: None IMPRESSION: Normal study. Electronically Signed   By: Charlett Nose M.D.   On: 08/26/2016 17:06   Dg Cystogram  Result Date: 08/27/2016 CLINICAL DATA:  Intraoperative cystogram EXAM: CYSTOGRAM COMPARISON:  None. FINDINGS: Cystogram was performed by Dr. Retta Diones. No images were saved for interpretation. IMPRESSION: As above. Electronically Signed   By: Charlett Nose M.D.   On: 08/27/2016 08:13   Ct Renal Stone Study  Result Date: 08/26/2016 CLINICAL DATA:  31 year old female with low back pain and fever. EXAM: CT ABDOMEN AND PELVIS WITHOUT CONTRAST TECHNIQUE: Multidetector CT imaging of the abdomen and pelvis was performed following the standard protocol without IV contrast. COMPARISON:  Abdominal CT dated 04/06/2016 FINDINGS: Evaluation of this exam is limited in the absence of intravenous contrast. Lower chest: The visualized lung bases are clear. No intra-abdominal free air or free fluid. Hepatobiliary: Diffuse fatty infiltration of the liver. No intrahepatic biliary ductal dilatation. The gallbladder is unremarkable. Pancreas: Unremarkable. No pancreatic ductal dilatation or surrounding inflammatory changes. Spleen: Normal in size without focal abnormality. Adrenals/Urinary Tract:  The adrenal glands are unremarkable. There is a 3 mm stone in the distal  left ureter close to the left ureterovesical junction. There is mild left hydronephroureter. A 2 mm nonobstructing left renal inferior pole calculus is seen. There is no hydronephrosis or nephrolithiasis on the right. The right ureter and urinary bladder appear unremarkable. Stomach/Bowel: There are small scattered sigmoid diverticula without active inflammatory changes. There is no evidence of bowel obstruction or active inflammation. Normal appendix. Vascular/Lymphatic: The abdominal aorta and IVC are grossly unremarkable on this noncontrast study. No portal venous gas identified. There is no adenopathy. Reproductive: The uterus is anteverted and grossly unremarkable. The ovaries appear unremarkable as well. No pelvic mass. Other: None Musculoskeletal: No acute or significant osseous findings. IMPRESSION: 1. A 3 mm distal left ureteral stone with mild left hydronephrosis. Correlation with urinalysis recommended to exclude superimposed UTI. A 2 mm nonobstructing left renal inferior pole stone is also noted. 2. Fatty liver. 3. Small scattered colonic diverticula. No bowel obstruction or active inflammation. Normal appendix. Electronically Signed   By: Elgie Collard M.D.   On: 08/26/2016 20:13      No results found for: HGBA1C Lab Results  Component Value Date   LDLCALC 94 07/03/2013   CREATININE 0.71 08/27/2016       Scheduled Meds: . citalopram  20 mg Oral Daily  . enoxaparin (LOVENOX) injection  40 mg Subcutaneous Q24H  . potassium chloride  40 mEq Oral STAT   Continuous Infusions: . 0.9 % NaCl with KCl 20 mEq / L    . cefTRIAXone (ROCEPHIN)  IV       LOS: 1 day    Time spent: >30 MINS    Richarda Overlie  [facility] Pager [phone]. If 7PM-7AM, please contact night-coverage at [email or web address], password [facility] 08/27/2016, 8:17 AM  LOS: 1 day

## 2016-08-28 LAB — COMPREHENSIVE METABOLIC PANEL
ALT: 23 U/L (ref 14–54)
AST: 21 U/L (ref 15–41)
Albumin: 2.5 g/dL — ABNORMAL LOW (ref 3.5–5.0)
Alkaline Phosphatase: 46 U/L (ref 38–126)
Anion gap: 7 (ref 5–15)
BUN: 6 mg/dL (ref 6–20)
CO2: 23 mmol/L (ref 22–32)
Calcium: 7.8 mg/dL — ABNORMAL LOW (ref 8.9–10.3)
Chloride: 109 mmol/L (ref 101–111)
Creatinine, Ser: 0.74 mg/dL (ref 0.44–1.00)
GFR calc Af Amer: >60 mL/min (ref >60)
GFR calc non Af Amer: >60 mL/min (ref >60)
Glucose, Bld: 121 mg/dL — ABNORMAL HIGH (ref 65–99)
Potassium: 3.2 mmol/L — ABNORMAL LOW (ref 3.5–5.1)
Sodium: 139 mmol/L (ref 135–145)
Total Bilirubin: 0.2 mg/dL — ABNORMAL LOW (ref 0.3–1.2)
Total Protein: 5.8 g/dL — ABNORMAL LOW (ref 6.5–8.1)

## 2016-08-28 LAB — CBC
HCT: 32.8 % — ABNORMAL LOW (ref 36.0–46.0)
Hemoglobin: 10.4 g/dL — ABNORMAL LOW (ref 12.0–15.0)
MCH: 30.1 pg (ref 26.0–34.0)
MCHC: 31.7 g/dL (ref 30.0–36.0)
MCV: 95.1 fL (ref 78.0–100.0)
Platelets: 272 10*3/uL (ref 150–400)
RBC: 3.45 MIL/uL — ABNORMAL LOW (ref 3.87–5.11)
RDW: 13.7 % (ref 11.5–15.5)
WBC: 10.4 10*3/uL (ref 4.0–10.5)

## 2016-08-28 LAB — URINE CULTURE: Special Requests: NORMAL

## 2016-08-28 MED ORDER — POTASSIUM CHLORIDE IN NACL 40-0.9 MEQ/L-% IV SOLN
INTRAVENOUS | Status: DC
  Administered 2016-08-28 – 2016-08-29 (×4): 100 mL/h via INTRAVENOUS
  Filled 2016-08-28 (×5): qty 1000

## 2016-08-28 MED ORDER — KETOROLAC TROMETHAMINE 30 MG/ML IJ SOLN
30.0000 mg | Freq: Once | INTRAMUSCULAR | Status: AC
  Administered 2016-08-28: 30 mg via INTRAVENOUS
  Filled 2016-08-28: qty 1

## 2016-08-28 MED ORDER — KETOROLAC TROMETHAMINE 60 MG/2ML IM SOLN
60.0000 mg | Freq: Once | INTRAMUSCULAR | Status: AC
  Administered 2016-08-28: 60 mg via INTRAMUSCULAR
  Filled 2016-08-28: qty 2

## 2016-08-28 MED ORDER — ACETAMINOPHEN 325 MG PO TABS
325.0000 mg | ORAL_TABLET | ORAL | Status: AC
  Administered 2016-08-28: 325 mg via ORAL
  Filled 2016-08-28: qty 1

## 2016-08-28 MED ORDER — METOCLOPRAMIDE HCL 5 MG/ML IJ SOLN
10.0000 mg | Freq: Once | INTRAMUSCULAR | Status: AC
  Administered 2016-08-28: 10 mg via INTRAVENOUS
  Filled 2016-08-28: qty 2

## 2016-08-28 MED ORDER — OXYBUTYNIN CHLORIDE 5 MG PO TABS
5.0000 mg | ORAL_TABLET | Freq: Three times a day (TID) | ORAL | Status: DC | PRN
Start: 2016-08-28 — End: 2016-08-30
  Administered 2016-08-28 (×2): 5 mg via ORAL
  Filled 2016-08-28 (×2): qty 1

## 2016-08-28 NOTE — Discharge Instructions (Signed)

## 2016-08-28 NOTE — Progress Notes (Signed)
2 Days Post-Op Subjective: Patient reports HA  Objective: Vital signs in last 24 hours: Temp:  [98.4 F (36.9 C)-103.4 F (39.7 C)] 100.9 F (38.3 C) (06/22 0622) Pulse Rate:  [71-94] 94 (06/22 0433) Resp:  [18] 18 (06/22 0433) BP: (111-126)/(56-71) 112/68 (06/22 0433) SpO2:  [99 %-100 %] 99 % (06/22 0433)  Intake/Output from previous day: 06/21 0701 - 06/22 0700 In: 1718.8 [P.O.:500; I.V.:1168.8; IV Piggyback:50] Out: -  Intake/Output this shift: No intake/output data recorded.  Physical Exam:  Constitutional: Vital signs reviewed. WD WN in NAD   Eyes: PERRL, No scleral icterus.   Cardiovascular: RRR Pulmonary/Chest: Normal effort    Lab Results:  Recent Labs  08/26/16 1643 08/27/16 0320 08/28/16 0559  HGB 12.8 11.8* 10.4*  HCT 39.8 36.4 32.8*   BMET  Recent Labs  08/27/16 0320 08/28/16 0559  NA 138 139  K 3.3* 3.2*  CL 108 109  CO2 25 23  GLUCOSE 182* 121*  BUN 7 6  CREATININE 0.71 0.74  CALCIUM 8.0* 7.8*   No results for input(s): LABPT, INR in the last 72 hours. No results for input(s): LABURIN in the last 72 hours. Results for orders placed or performed during the hospital encounter of 04/06/16  Urine culture     Status: Abnormal   Collection Time: 04/06/16  2:10 AM  Result Value Ref Range Status   Specimen Description URINE, RANDOM  Final   Special Requests NONE  Final   Culture MULTIPLE SPECIES PRESENT, SUGGEST RECOLLECTION (A)  Final   Report Status 04/07/2016 FINAL  Final    Studies/Results: Dg Chest 2 View  Result Date: 08/26/2016 CLINICAL DATA:  31 year old female with fever, nausea vomiting. EXAM: CHEST  2 VIEW COMPARISON:  Chest radiograph dated 03/08/2013 FINDINGS: The heart size and mediastinal contours are within normal limits. Both lungs are clear. The visualized skeletal structures are unremarkable. IMPRESSION: No active cardiopulmonary disease. Electronically Signed   By: Elgie CollardArash  Radparvar M.D.   On: 08/26/2016 18:40   Ct Head  Wo Contrast  Result Date: 08/26/2016 CLINICAL DATA:  Migraine for 2 days.  Nausea. EXAM: CT HEAD WITHOUT CONTRAST TECHNIQUE: Contiguous axial images were obtained from the base of the skull through the vertex without intravenous contrast. COMPARISON:  None. FINDINGS: Brain: No acute intracranial abnormality. Specifically, no hemorrhage, hydrocephalus, mass lesion, acute infarction, or significant intracranial injury. Vascular: No hyperdense vessel or unexpected calcification. Skull: No acute calvarial abnormality. Sinuses/Orbits: Visualized paranasal sinuses and mastoids clear. Orbital soft tissues unremarkable. Other: None IMPRESSION: Normal study. Electronically Signed   By: Charlett NoseKevin  Dover M.D.   On: 08/26/2016 17:06   Dg Cystogram  Result Date: 08/27/2016 CLINICAL DATA:  Intraoperative cystogram EXAM: CYSTOGRAM COMPARISON:  None. FINDINGS: Cystogram was performed by Dr. Retta Dionesahlstedt. No images were saved for interpretation. IMPRESSION: As above. Electronically Signed   By: Charlett NoseKevin  Dover M.D.   On: 08/27/2016 08:13   Ct Renal Stone Study  Result Date: 08/26/2016 CLINICAL DATA:  31 year old female with low back pain and fever. EXAM: CT ABDOMEN AND PELVIS WITHOUT CONTRAST TECHNIQUE: Multidetector CT imaging of the abdomen and pelvis was performed following the standard protocol without IV contrast. COMPARISON:  Abdominal CT dated 04/06/2016 FINDINGS: Evaluation of this exam is limited in the absence of intravenous contrast. Lower chest: The visualized lung bases are clear. No intra-abdominal free air or free fluid. Hepatobiliary: Diffuse fatty infiltration of the liver. No intrahepatic biliary ductal dilatation. The gallbladder is unremarkable. Pancreas: Unremarkable. No pancreatic ductal dilatation or surrounding inflammatory  changes. Spleen: Normal in size without focal abnormality. Adrenals/Urinary Tract: The adrenal glands are unremarkable. There is a 3 mm stone in the distal left ureter close to the left  ureterovesical junction. There is mild left hydronephroureter. A 2 mm nonobstructing left renal inferior pole calculus is seen. There is no hydronephrosis or nephrolithiasis on the right. The right ureter and urinary bladder appear unremarkable. Stomach/Bowel: There are small scattered sigmoid diverticula without active inflammatory changes. There is no evidence of bowel obstruction or active inflammation. Normal appendix. Vascular/Lymphatic: The abdominal aorta and IVC are grossly unremarkable on this noncontrast study. No portal venous gas identified. There is no adenopathy. Reproductive: The uterus is anteverted and grossly unremarkable. The ovaries appear unremarkable as well. No pelvic mass. Other: None Musculoskeletal: No acute or significant osseous findings. IMPRESSION: 1. A 3 mm distal left ureteral stone with mild left hydronephrosis. Correlation with urinalysis recommended to exclude superimposed UTI. A 2 mm nonobstructing left renal inferior pole stone is also noted. 2. Fatty liver. 3. Small scattered colonic diverticula. No bowel obstruction or active inflammation. Normal appendix. Electronically Signed   By: Elgie Collard M.D.   On: 08/26/2016 20:13    Assessment/Plan:   POD 2 left stent for stone/pyonephrosis. Still has fever (expected--will take a few days to return to nml)  OK to d/c once culture back and fever gone--I have told her to f/u after d/c to discuss stone mgmt  Added ditropan for urgency from stent   LOS: 2 days   Marcine Matar M 08/28/2016, 7:47 AM

## 2016-08-28 NOTE — Progress Notes (Signed)
[facility] PROGRESS NOTE  Amber Nielsen:[id number] DOB: 26-May-1985 DOA: 08/26/2016   PCP: Cain Saupe, MD     Assessment/Plan: Principal Problem:   Sepsis secondary to UTI [facility]) Active Problems:   Morbid obesity (HCC)   Hypokalemia   Ureteral stone with hydronephrosis   Acute pyelonephritis   31 y.o. female with medical history significant of HTN, asthma, bipolar disorder, obesity, nephrolithiasis and gestational diabetes diet controlled; who presents with complaints of fever and headache  6/20. Patient found to be tachycardic and febrile. Found to have a ureteral stone, status post cystoscopy and left ureteral stent placement.  Assessment and plan Sepsis secondary to pyelonephritis complicated by nephrolithiasis Continues to have high-grade fever today, tachycardia, as per urology fever is expected for a few days postprocedure. If fever persists patient would need repeat CT imaging 6/23 UA positive for signs of infection and CT scan showing a 3 mm left ureteral stone with mild left hydronephrosis.  - Status post cystourethroscopy, left ureteral catheterization and stent placement by  Marcine Matar, MD 6/20 - Continue inpatient, IV fluids - Urine culture nonspecific, blood culture on 6/20 pending - Hydrocodone prn pain - Continue Rocephin,   2 g daily given complicated UTI - Appreciate urology consultative services Pyridium 200 mg 3 times a day for 2 days  Headache This is related to   high fever of 103, will treat with Toradol as well as Reglan IV   Nausea and vomiting: Secondary to above. - Zofran when necessary   Asthma Stable   Hypokalemia: Repleted  Bipolar - Continue Celexa   Morbid Obesity: BMI 41    DVT prophylaxsis Lovenox  Code Status:  Full code    Family Communication: Discussed in detail with the patient, all imaging results, lab results explained to the patient   Disposition Plan:  1-2 days       Consultants:  Urology  Procedures:  None  Antibiotics: Anti-infectives    Start     Dose/Rate Route Frequency Ordered Stop   08/27/16 1800  cefTRIAXone (ROCEPHIN) 1 g in dextrose 5 % 50 mL IVPB  Status:  Discontinued     1 g 100 mL/hr over 30 Minutes Intravenous Every 24 hours 08/26/16 2222 08/27/16 0823   08/27/16 1800  cefTRIAXone (ROCEPHIN) 2 g in dextrose 5 % 50 mL IVPB     2 g 100 mL/hr over 30 Minutes Intravenous Every 24 hours 08/27/16 0823     08/26/16 2030  cefTRIAXone (ROCEPHIN) 1 g in dextrose 5 % 50 mL IVPB     1 g 100 mL/hr over 30 Minutes Intravenous  Once 08/26/16 2020 08/26/16 2058         HPI/Subjective:  High-grade fever of 103, severe headache this morning  Objective: Vitals:   08/28/16 0433 08/28/16 0457 08/28/16 0540 08/28/16 0622  BP: 112/68     Pulse: 94     Resp: 18     Temp: (!) 102.9 F (39.4 C) (!) 103.2 F (39.6 C) (!) 103.4 F (39.7 C) (!) 100.9 F (38.3 C)  TempSrc: Oral Oral Rectal Oral  SpO2: 99%     Weight:      Height:        Intake/Output Summary (Last 24 hours) at 08/28/16 0829 Last data filed at 08/27/16 1800  Gross per 24 hour  Intake          1718.75 ml  Output  0 ml  Net          1718.75 ml    Exam:  Examination:  General exam:Uncomfortable due to headache  Respiratory system: Clear to auscultation. Respiratory effort normal. Cardiovascular system: S1 & S2 heard, RRR. No JVD, murmurs, rubs, gallops or clicks. No pedal edema. Gastrointestinal system: Left-sided flank pain, otherwise soft and nontender. No organomegaly or masses felt. Normal bowel sounds heard. Central nervous system: Alert and oriented. No focal neurological deficits. Extremities: Symmetric 5 x 5 power. Skin: No rashes, lesions or ulcers Psychiatry: Judgement and insight appear normal. Mood & affect appropriate.     Data Reviewed: I have personally reviewed following labs and imaging studies  Micro Results Recent Results  (from the past 240 hour(s))  Urine culture     Status: Abnormal   Collection Time: 08/26/16  6:06 PM  Result Value Ref Range Status   Specimen Description URINE, RANDOM  Final   Special Requests Normal  Final   Culture MULTIPLE SPECIES PRESENT, SUGGEST RECOLLECTION (A)  Final   Report Status 08/28/2016 FINAL  Final    Radiology Reports Dg Chest 2 View  Result Date: 08/26/2016 CLINICAL DATA:  31 year old female with fever, nausea vomiting. EXAM: CHEST  2 VIEW COMPARISON:  Chest radiograph dated 03/08/2013 FINDINGS: The heart size and mediastinal contours are within normal limits. Both lungs are clear. The visualized skeletal structures are unremarkable. IMPRESSION: No active cardiopulmonary disease. Electronically Signed   By: Elgie Collard M.D.   On: 08/26/2016 18:40   Ct Head Wo Contrast  Result Date: 08/26/2016 CLINICAL DATA:  Migraine for 2 days.  Nausea. EXAM: CT HEAD WITHOUT CONTRAST TECHNIQUE: Contiguous axial images were obtained from the base of the skull through the vertex without intravenous contrast. COMPARISON:  None. FINDINGS: Brain: No acute intracranial abnormality. Specifically, no hemorrhage, hydrocephalus, mass lesion, acute infarction, or significant intracranial injury. Vascular: No hyperdense vessel or unexpected calcification. Skull: No acute calvarial abnormality. Sinuses/Orbits: Visualized paranasal sinuses and mastoids clear. Orbital soft tissues unremarkable. Other: None IMPRESSION: Normal study. Electronically Signed   By: Charlett Nose M.D.   On: 08/26/2016 17:06   Dg Cystogram  Result Date: 08/27/2016 CLINICAL DATA:  Intraoperative cystogram EXAM: CYSTOGRAM COMPARISON:  None. FINDINGS: Cystogram was performed by Dr. Retta Diones. No images were saved for interpretation. IMPRESSION: As above. Electronically Signed   By: Charlett Nose M.D.   On: 08/27/2016 08:13   Ct Renal Stone Study  Result Date: 08/26/2016 CLINICAL DATA:  31 year old female with low back pain and  fever. EXAM: CT ABDOMEN AND PELVIS WITHOUT CONTRAST TECHNIQUE: Multidetector CT imaging of the abdomen and pelvis was performed following the standard protocol without IV contrast. COMPARISON:  Abdominal CT dated 04/06/2016 FINDINGS: Evaluation of this exam is limited in the absence of intravenous contrast. Lower chest: The visualized lung bases are clear. No intra-abdominal free air or free fluid. Hepatobiliary: Diffuse fatty infiltration of the liver. No intrahepatic biliary ductal dilatation. The gallbladder is unremarkable. Pancreas: Unremarkable. No pancreatic ductal dilatation or surrounding inflammatory changes. Spleen: Normal in size without focal abnormality. Adrenals/Urinary Tract: The adrenal glands are unremarkable. There is a 3 mm stone in the distal left ureter close to the left ureterovesical junction. There is mild left hydronephroureter. A 2 mm nonobstructing left renal inferior pole calculus is seen. There is no hydronephrosis or nephrolithiasis on the right. The right ureter and urinary bladder appear unremarkable. Stomach/Bowel: There are small scattered sigmoid diverticula without active inflammatory changes. There is no evidence of bowel  obstruction or active inflammation. Normal appendix. Vascular/Lymphatic: The abdominal aorta and IVC are grossly unremarkable on this noncontrast study. No portal venous gas identified. There is no adenopathy. Reproductive: The uterus is anteverted and grossly unremarkable. The ovaries appear unremarkable as well. No pelvic mass. Other: None Musculoskeletal: No acute or significant osseous findings. IMPRESSION: 1. A 3 mm distal left ureteral stone with mild left hydronephrosis. Correlation with urinalysis recommended to exclude superimposed UTI. A 2 mm nonobstructing left renal inferior pole stone is also noted. 2. Fatty liver. 3. Small scattered colonic diverticula. No bowel obstruction or active inflammation. Normal appendix. Electronically Signed   By: Elgie Collard M.D.   On: 08/26/2016 20:13     CBC  Recent Labs Lab 08/26/16 1643 08/27/16 0320 08/28/16 0559  WBC 11.7* 12.0* 10.4  HGB 12.8 11.8* 10.4*  HCT 39.8 36.4 32.8*  PLT 281 257 272  MCV 92.6 91.9 95.1  MCH 29.8 29.8 30.1  MCHC 32.2 32.4 31.7  RDW 13.3 13.2 13.7    Chemistries   Recent Labs Lab 08/26/16 1643 08/27/16 0320 08/28/16 0559  NA 138 138 139  K 3.2* 3.3* 3.2*  CL 104 108 109  CO2 26 25 23   GLUCOSE 98 182* 121*  BUN 5* 7 6  CREATININE 0.74 0.71 0.74  CALCIUM 8.5* 8.0* 7.8*  MG  --  2.0  --   AST 21  --  21  ALT 25  --  23  ALKPHOS 67  --  46  BILITOT 1.0  --  0.2*   ------------------------------------------------------------------------------------------------------------------ estimated creatinine clearance is 153.4 mL/min (by C-G formula based on SCr of 0.74 mg/dL). ------------------------------------------------------------------------------------------------------------------ No results for input(s): HGBA1C in the last 72 hours. ------------------------------------------------------------------------------------------------------------------ No results for input(s): CHOL, HDL, LDLCALC, TRIG, CHOLHDL, LDLDIRECT in the last 72 hours. ------------------------------------------------------------------------------------------------------------------ No results for input(s): TSH, T4TOTAL, T3FREE, THYROIDAB in the last 72 hours.  Invalid input(s): FREET3 ------------------------------------------------------------------------------------------------------------------ No results for input(s): VITAMINB12, FOLATE, FERRITIN, TIBC, IRON, RETICCTPCT in the last 72 hours.  Coagulation profile No results for input(s): INR, PROTIME in the last 168 hours.  No results for input(s): DDIMER in the last 72 hours.  Cardiac Enzymes No results for input(s): CKMB, TROPONINI, MYOGLOBIN in the last 168 hours.  Invalid input(s):  CK ------------------------------------------------------------------------------------------------------------------ Invalid input(s): POCBNP   CBG: No results for input(s): GLUCAP in the last 168 hours.     Studies: Dg Chest 2 View  Result Date: 08/26/2016 CLINICAL DATA:  31 year old female with fever, nausea vomiting. EXAM: CHEST  2 VIEW COMPARISON:  Chest radiograph dated 03/08/2013 FINDINGS: The heart size and mediastinal contours are within normal limits. Both lungs are clear. The visualized skeletal structures are unremarkable. IMPRESSION: No active cardiopulmonary disease. Electronically Signed   By: Elgie Collard M.D.   On: 08/26/2016 18:40   Ct Head Wo Contrast  Result Date: 08/26/2016 CLINICAL DATA:  Migraine for 2 days.  Nausea. EXAM: CT HEAD WITHOUT CONTRAST TECHNIQUE: Contiguous axial images were obtained from the base of the skull through the vertex without intravenous contrast. COMPARISON:  None. FINDINGS: Brain: No acute intracranial abnormality. Specifically, no hemorrhage, hydrocephalus, mass lesion, acute infarction, or significant intracranial injury. Vascular: No hyperdense vessel or unexpected calcification. Skull: No acute calvarial abnormality. Sinuses/Orbits: Visualized paranasal sinuses and mastoids clear. Orbital soft tissues unremarkable. Other: None IMPRESSION: Normal study. Electronically Signed   By: Charlett Nose M.D.   On: 08/26/2016 17:06   Dg Cystogram  Result Date: 08/27/2016 CLINICAL DATA:  Intraoperative cystogram EXAM: CYSTOGRAM COMPARISON:  None. FINDINGS: Cystogram was performed by Dr. Retta Dionesahlstedt. No images were saved for interpretation. IMPRESSION: As above. Electronically Signed   By: Charlett NoseKevin  Dover M.D.   On: 08/27/2016 08:13   Ct Renal Stone Study  Result Date: 08/26/2016 CLINICAL DATA:  31 year old female with low back pain and fever. EXAM: CT ABDOMEN AND PELVIS WITHOUT CONTRAST TECHNIQUE: Multidetector CT imaging of the abdomen and pelvis was  performed following the standard protocol without IV contrast. COMPARISON:  Abdominal CT dated 04/06/2016 FINDINGS: Evaluation of this exam is limited in the absence of intravenous contrast. Lower chest: The visualized lung bases are clear. No intra-abdominal free air or free fluid. Hepatobiliary: Diffuse fatty infiltration of the liver. No intrahepatic biliary ductal dilatation. The gallbladder is unremarkable. Pancreas: Unremarkable. No pancreatic ductal dilatation or surrounding inflammatory changes. Spleen: Normal in size without focal abnormality. Adrenals/Urinary Tract: The adrenal glands are unremarkable. There is a 3 mm stone in the distal left ureter close to the left ureterovesical junction. There is mild left hydronephroureter. A 2 mm nonobstructing left renal inferior pole calculus is seen. There is no hydronephrosis or nephrolithiasis on the right. The right ureter and urinary bladder appear unremarkable. Stomach/Bowel: There are small scattered sigmoid diverticula without active inflammatory changes. There is no evidence of bowel obstruction or active inflammation. Normal appendix. Vascular/Lymphatic: The abdominal aorta and IVC are grossly unremarkable on this noncontrast study. No portal venous gas identified. There is no adenopathy. Reproductive: The uterus is anteverted and grossly unremarkable. The ovaries appear unremarkable as well. No pelvic mass. Other: None Musculoskeletal: No acute or significant osseous findings. IMPRESSION: 1. A 3 mm distal left ureteral stone with mild left hydronephrosis. Correlation with urinalysis recommended to exclude superimposed UTI. A 2 mm nonobstructing left renal inferior pole stone is also noted. 2. Fatty liver. 3. Small scattered colonic diverticula. No bowel obstruction or active inflammation. Normal appendix. Electronically Signed   By: Elgie CollardArash  Radparvar M.D.   On: 08/26/2016 20:13      No results found for: HGBA1C Lab Results  Component Value Date    LDLCALC 94 07/03/2013   CREATININE 0.74 08/28/2016       Scheduled Meds: . citalopram  20 mg Oral Daily  . enoxaparin (LOVENOX) injection  40 mg Subcutaneous Q24H  . ketorolac  60 mg Intramuscular Once  . metoCLOPramide (REGLAN) injection  10 mg Intravenous Once  . phenazopyridine  200 mg Oral TID WC   Continuous Infusions: . 0.9 % NaCl with KCl 20 mEq / L 125 mL/hr at 08/28/16 0814  . cefTRIAXone (ROCEPHIN)  IV Stopped (08/27/16 1811)     LOS: 2 days    Time spent: >30 MINS    Richarda OverlieNayana Natori Gudino  [facility] Pager [phone]. If 7PM-7AM, please contact night-coverage at [email or web address], password [email or web address] 08/28/2016, 8:29 AM  LOS: 2 days

## 2016-08-28 NOTE — Plan of Care (Signed)
Problem: Pain Managment: Goal: General experience of comfort will improve Given Morphine IV as needed twice as of this writing

## 2016-08-29 ENCOUNTER — Inpatient Hospital Stay (HOSPITAL_COMMUNITY): Payer: Self-pay

## 2016-08-29 DIAGNOSIS — N200 Calculus of kidney: Secondary | ICD-10-CM

## 2016-08-29 LAB — COMPREHENSIVE METABOLIC PANEL
ALT: 20 U/L (ref 14–54)
AST: 15 U/L (ref 15–41)
Albumin: 2.5 g/dL — ABNORMAL LOW (ref 3.5–5.0)
Alkaline Phosphatase: 52 U/L (ref 38–126)
Anion gap: 5 (ref 5–15)
BUN: 6 mg/dL (ref 6–20)
CO2: 26 mmol/L (ref 22–32)
Calcium: 8.3 mg/dL — ABNORMAL LOW (ref 8.9–10.3)
Chloride: 108 mmol/L (ref 101–111)
Creatinine, Ser: 0.69 mg/dL (ref 0.44–1.00)
GFR calc Af Amer: >60 mL/min (ref >60)
GFR calc non Af Amer: >60 mL/min (ref >60)
Glucose, Bld: 102 mg/dL — ABNORMAL HIGH (ref 65–99)
Potassium: 3.6 mmol/L (ref 3.5–5.1)
Sodium: 139 mmol/L (ref 135–145)
Total Bilirubin: 0.5 mg/dL (ref 0.3–1.2)
Total Protein: 6.1 g/dL — ABNORMAL LOW (ref 6.5–8.1)

## 2016-08-29 MED ORDER — IOPAMIDOL (ISOVUE-300) INJECTION 61%
INTRAVENOUS | Status: AC
  Administered 2016-08-29: 100 mL
  Filled 2016-08-29: qty 100

## 2016-08-29 MED ORDER — KETOROLAC TROMETHAMINE 30 MG/ML IJ SOLN
30.0000 mg | Freq: Four times a day (QID) | INTRAMUSCULAR | Status: DC | PRN
  Administered 2016-08-29 – 2016-08-30 (×4): 30 mg via INTRAVENOUS
  Filled 2016-08-29 (×4): qty 1

## 2016-08-29 MED ORDER — IOPAMIDOL (ISOVUE-300) INJECTION 61%
INTRAVENOUS | Status: AC
  Administered 2016-08-29: 30 mL
  Filled 2016-08-29: qty 30

## 2016-08-29 MED ORDER — POTASSIUM CHLORIDE CRYS ER 20 MEQ PO TBCR
40.0000 meq | EXTENDED_RELEASE_TABLET | Freq: Once | ORAL | Status: DC
  Filled 2016-08-29: qty 2

## 2016-08-29 NOTE — Progress Notes (Signed)
Patient is going off the unit for CT

## 2016-08-29 NOTE — Progress Notes (Signed)
[facility] PROGRESS NOTE  Amber Nielsen ZOX:[id number] DOB: Mar 14, 1985 DOA: 08/26/2016   PCP: Cain Saupe, MD     Assessment/Plan: Principal Problem:   Sepsis secondary to UTI [facility]) Active Problems:   Morbid obesity (HCC)   Hypokalemia   Ureteral stone with hydronephrosis   Acute pyelonephritis   Kidney stone   31 y.o. female with medical history significant of HTN, asthma, bipolar disorder, obesity, nephrolithiasis and gestational diabetes diet controlled; who presents with complaints of fever and headache  6/20. Patient found to be tachycardic and febrile. Found to have a ureteral stone, status post cystoscopy and left ureteral stent placement. Continues to have intermittent fever  Assessment and plan Sepsis secondary to pyelonephritis complicated by nephrolithiasis Continues to have high-grade fever today, tachycardia, as per urology fever is expected for few days postprocedure  . We will repeat CT imaging 6/23 to rule out perinephric abscess Initial  CT scan showed a 3 mm left ureteral stone with mild left hydronephrosis.  Now Status post cystourethroscopy, left ureteral catheterization and stent placement by  Marcine Matar, MD 6/20 - Continue inpatient, IV fluids - Urine culture nonspecific, blood culture on 6/20 pending - Hydrocodone prn pain - Continue Rocephin,   2 g daily given complicated UTI - Appreciate urology follow-up on a regular basis Completed Pyridium 200 mg 3 times a day for 2 days   Headache This is related to   high fever of 103, continue as needed Toradol   Monitor renal fn  Nausea and vomiting: Secondary to above. - Zofran when necessary   Asthma Stable   Hypokalemia: Repleted  Bipolar - Continue Celexa   Morbid Obesity: BMI 41    DVT prophylaxsis Lovenox  Code Status:  Full code    Family Communication: Discussed in detail with the patient, all imaging results, lab results explained to the patient    Disposition Plan:  1-2 days      Consultants:  Urology  Procedures:  None  Antibiotics: Anti-infectives    Start     Dose/Rate Route Frequency Ordered Stop   08/27/16 1800  cefTRIAXone (ROCEPHIN) 1 g in dextrose 5 % 50 mL IVPB  Status:  Discontinued     1 g 100 mL/hr over 30 Minutes Intravenous Every 24 hours 08/26/16 2222 08/27/16 0823   08/27/16 1800  cefTRIAXone (ROCEPHIN) 2 g in dextrose 5 % 50 mL IVPB     2 g 100 mL/hr over 30 Minutes Intravenous Every 24 hours 08/27/16 0823     08/26/16 2030  cefTRIAXone (ROCEPHIN) 1 g in dextrose 5 % 50 mL IVPB     1 g 100 mL/hr over 30 Minutes Intravenous  Once 08/26/16 2020 08/26/16 2058         HPI/Subjective:  High-grade fever of 103, severe headache this morning  Objective: Vitals:   08/28/16 1500 08/28/16 1818 08/28/16 2122 08/29/16 0551  BP: 126/71  128/68 120/69  Pulse: 85  98 89  Resp: 18  18 18   Temp: 99.4 F (37.4 C) 100 F (37.8 C) (!) 101.9 F (38.8 C) 100 F (37.8 C)  TempSrc: Oral Oral Oral Oral  SpO2: 98%  96% 90%  Weight:      Height:        Intake/Output Summary (Last 24 hours) at 08/29/16 0900 Last data filed at 08/29/16 0600  Gross per 24 hour  Intake          2811.67 ml  Output  0 ml  Net          2811.67 ml    Exam:  Examination:  General exam:Uncomfortable due to headache  Respiratory system: Clear to auscultation. Respiratory effort normal. Cardiovascular system: S1 & S2 heard, RRR. No JVD, murmurs, rubs, gallops or clicks. No pedal edema. Gastrointestinal system: Left-sided flank pain, otherwise soft and nontender. No organomegaly or masses felt. Normal bowel sounds heard. Central nervous system: Alert and oriented. No focal neurological deficits. Extremities: Symmetric 5 x 5 power. Skin: No rashes, lesions or ulcers Psychiatry: Judgement and insight appear normal. Mood & affect appropriate.     Data Reviewed: I have personally reviewed following labs and  imaging studies  Micro Results Recent Results (from the past 240 hour(s))  Urine culture     Status: Abnormal   Collection Time: 08/26/16  6:06 PM  Result Value Ref Range Status   Specimen Description URINE, RANDOM  Final   Special Requests Normal  Final   Culture MULTIPLE SPECIES PRESENT, SUGGEST RECOLLECTION (A)  Final   Report Status 08/28/2016 FINAL  Final  Culture, blood (x 2)     Status: None (Preliminary result)   Collection Time: 08/26/16 11:08 PM  Result Value Ref Range Status   Specimen Description BLOOD LEFT ARM  Final   Special Requests   Final    BOTTLES DRAWN AEROBIC AND ANAEROBIC Blood Culture adequate volume   Culture NO GROWTH 1 DAY  Final   Report Status PENDING  Incomplete  Culture, blood (x 2)     Status: None (Preliminary result)   Collection Time: 08/26/16 11:09 PM  Result Value Ref Range Status   Specimen Description BLOOD LEFT HAND  Final   Special Requests IN PEDIATRIC BOTTLE Blood Culture adequate volume  Final   Culture NO GROWTH 1 DAY  Final   Report Status PENDING  Incomplete    Radiology Reports Dg Chest 2 View  Result Date: 08/26/2016 CLINICAL DATA:  31 year old female with fever, nausea vomiting. EXAM: CHEST  2 VIEW COMPARISON:  Chest radiograph dated 03/08/2013 FINDINGS: The heart size and mediastinal contours are within normal limits. Both lungs are clear. The visualized skeletal structures are unremarkable. IMPRESSION: No active cardiopulmonary disease. Electronically Signed   By: Elgie Collard M.D.   On: 08/26/2016 18:40   Ct Head Wo Contrast  Result Date: 08/26/2016 CLINICAL DATA:  Migraine for 2 days.  Nausea. EXAM: CT HEAD WITHOUT CONTRAST TECHNIQUE: Contiguous axial images were obtained from the base of the skull through the vertex without intravenous contrast. COMPARISON:  None. FINDINGS: Brain: No acute intracranial abnormality. Specifically, no hemorrhage, hydrocephalus, mass lesion, acute infarction, or significant intracranial injury.  Vascular: No hyperdense vessel or unexpected calcification. Skull: No acute calvarial abnormality. Sinuses/Orbits: Visualized paranasal sinuses and mastoids clear. Orbital soft tissues unremarkable. Other: None IMPRESSION: Normal study. Electronically Signed   By: Charlett Nose M.D.   On: 08/26/2016 17:06   Dg Cystogram  Result Date: 08/27/2016 CLINICAL DATA:  Intraoperative cystogram EXAM: CYSTOGRAM COMPARISON:  None. FINDINGS: Cystogram was performed by Dr. Retta Diones. No images were saved for interpretation. IMPRESSION: As above. Electronically Signed   By: Charlett Nose M.D.   On: 08/27/2016 08:13   Ct Renal Stone Study  Result Date: 08/26/2016 CLINICAL DATA:  31 year old female with low back pain and fever. EXAM: CT ABDOMEN AND PELVIS WITHOUT CONTRAST TECHNIQUE: Multidetector CT imaging of the abdomen and pelvis was performed following the standard protocol without IV contrast. COMPARISON:  Abdominal CT dated 04/06/2016 FINDINGS:  Evaluation of this exam is limited in the absence of intravenous contrast. Lower chest: The visualized lung bases are clear. No intra-abdominal free air or free fluid. Hepatobiliary: Diffuse fatty infiltration of the liver. No intrahepatic biliary ductal dilatation. The gallbladder is unremarkable. Pancreas: Unremarkable. No pancreatic ductal dilatation or surrounding inflammatory changes. Spleen: Normal in size without focal abnormality. Adrenals/Urinary Tract: The adrenal glands are unremarkable. There is a 3 mm stone in the distal left ureter close to the left ureterovesical junction. There is mild left hydronephroureter. A 2 mm nonobstructing left renal inferior pole calculus is seen. There is no hydronephrosis or nephrolithiasis on the right. The right ureter and urinary bladder appear unremarkable. Stomach/Bowel: There are small scattered sigmoid diverticula without active inflammatory changes. There is no evidence of bowel obstruction or active inflammation. Normal  appendix. Vascular/Lymphatic: The abdominal aorta and IVC are grossly unremarkable on this noncontrast study. No portal venous gas identified. There is no adenopathy. Reproductive: The uterus is anteverted and grossly unremarkable. The ovaries appear unremarkable as well. No pelvic mass. Other: None Musculoskeletal: No acute or significant osseous findings. IMPRESSION: 1. A 3 mm distal left ureteral stone with mild left hydronephrosis. Correlation with urinalysis recommended to exclude superimposed UTI. A 2 mm nonobstructing left renal inferior pole stone is also noted. 2. Fatty liver. 3. Small scattered colonic diverticula. No bowel obstruction or active inflammation. Normal appendix. Electronically Signed   By: Elgie Collard M.D.   On: 08/26/2016 20:13     CBC  Recent Labs Lab 08/26/16 1643 08/27/16 0320 08/28/16 0559  WBC 11.7* 12.0* 10.4  HGB 12.8 11.8* 10.4*  HCT 39.8 36.4 32.8*  PLT 281 257 272  MCV 92.6 91.9 95.1  MCH 29.8 29.8 30.1  MCHC 32.2 32.4 31.7  RDW 13.3 13.2 13.7    Chemistries   Recent Labs Lab 08/26/16 1643 08/27/16 0320 08/28/16 0559  NA 138 138 139  K 3.2* 3.3* 3.2*  CL 104 108 109  CO2 26 25 23   GLUCOSE 98 182* 121*  BUN 5* 7 6  CREATININE 0.74 0.71 0.74  CALCIUM 8.5* 8.0* 7.8*  MG  --  2.0  --   AST 21  --  21  ALT 25  --  23  ALKPHOS 67  --  46  BILITOT 1.0  --  0.2*   ------------------------------------------------------------------------------------------------------------------ estimated creatinine clearance is 153.4 mL/min (by C-G formula based on SCr of 0.74 mg/dL). ------------------------------------------------------------------------------------------------------------------ No results for input(s): HGBA1C in the last 72 hours. ------------------------------------------------------------------------------------------------------------------ No results for input(s): CHOL, HDL, LDLCALC, TRIG, CHOLHDL, LDLDIRECT in the last 72  hours. ------------------------------------------------------------------------------------------------------------------ No results for input(s): TSH, T4TOTAL, T3FREE, THYROIDAB in the last 72 hours.  Invalid input(s): FREET3 ------------------------------------------------------------------------------------------------------------------ No results for input(s): VITAMINB12, FOLATE, FERRITIN, TIBC, IRON, RETICCTPCT in the last 72 hours.  Coagulation profile No results for input(s): INR, PROTIME in the last 168 hours.  No results for input(s): DDIMER in the last 72 hours.  Cardiac Enzymes No results for input(s): CKMB, TROPONINI, MYOGLOBIN in the last 168 hours.  Invalid input(s): CK ------------------------------------------------------------------------------------------------------------------ Invalid input(s): POCBNP   CBG: No results for input(s): GLUCAP in the last 168 hours.     Studies: No results found.    No results found for: HGBA1C Lab Results  Component Value Date   LDLCALC 94 07/03/2013   CREATININE 0.74 08/28/2016       Scheduled Meds: . citalopram  20 mg Oral Daily  . enoxaparin (LOVENOX) injection  40 mg Subcutaneous Q24H  . phenazopyridine  200 mg Oral TID WC   Continuous Infusions: . 0.9 % NaCl with KCl 40 mEq / L 100 mL/hr (08/28/16 2158)  . cefTRIAXone (ROCEPHIN)  IV Stopped (08/28/16 1730)     LOS: 3 days    Time spent: >30 MINS    Richarda OverlieNayana Graceyn Fodor  [facility] Pager [phone]. If 7PM-7AM, please contact night-coverage at [email or web address], password [email or web address] 08/29/2016, 9:00 AM  LOS: 3 days

## 2016-08-30 DIAGNOSIS — E876 Hypokalemia: Secondary | ICD-10-CM

## 2016-08-30 DIAGNOSIS — A419 Sepsis, unspecified organism: Principal | ICD-10-CM

## 2016-08-30 DIAGNOSIS — N390 Urinary tract infection, site not specified: Secondary | ICD-10-CM

## 2016-08-30 DIAGNOSIS — N10 Acute tubulo-interstitial nephritis: Secondary | ICD-10-CM

## 2016-08-30 LAB — COMPREHENSIVE METABOLIC PANEL
ALT: 20 U/L (ref 14–54)
AST: 16 U/L (ref 15–41)
Albumin: 2.3 g/dL — ABNORMAL LOW (ref 3.5–5.0)
Alkaline Phosphatase: 47 U/L (ref 38–126)
Anion gap: 4 — ABNORMAL LOW (ref 5–15)
BUN: 9 mg/dL (ref 6–20)
CO2: 29 mmol/L (ref 22–32)
Calcium: 8.3 mg/dL — ABNORMAL LOW (ref 8.9–10.3)
Chloride: 107 mmol/L (ref 101–111)
Creatinine, Ser: 0.7 mg/dL (ref 0.44–1.00)
GFR calc Af Amer: >60 mL/min (ref >60)
GFR calc non Af Amer: >60 mL/min (ref >60)
Glucose, Bld: 104 mg/dL — ABNORMAL HIGH (ref 65–99)
Potassium: 3.4 mmol/L — ABNORMAL LOW (ref 3.5–5.1)
Sodium: 140 mmol/L (ref 135–145)
Total Bilirubin: 0.2 mg/dL — ABNORMAL LOW (ref 0.3–1.2)
Total Protein: 5.8 g/dL — ABNORMAL LOW (ref 6.5–8.1)

## 2016-08-30 MED ORDER — CEPHALEXIN 500 MG PO CAPS
500.0000 mg | ORAL_CAPSULE | Freq: Two times a day (BID) | ORAL | Status: DC
  Administered 2016-08-30: 500 mg via ORAL
  Filled 2016-08-30: qty 1

## 2016-08-30 MED ORDER — POTASSIUM CHLORIDE CRYS ER 20 MEQ PO TBCR
20.0000 meq | EXTENDED_RELEASE_TABLET | Freq: Once | ORAL | Status: AC
  Administered 2016-08-30: 20 meq via ORAL
  Filled 2016-08-30: qty 1

## 2016-08-30 MED ORDER — PRO-STAT SUGAR FREE PO LIQD: 30.0000 mL | Freq: Two times a day (BID) | ORAL | 0 refills | Status: DC

## 2016-08-30 MED ORDER — CEPHALEXIN 500 MG PO CAPS: 500.0000 mg | ORAL_CAPSULE | Freq: Two times a day (BID) | ORAL | 0 refills | Status: DC

## 2016-08-30 MED ORDER — PRO-STAT SUGAR FREE PO LIQD: 30.0000 mL | Freq: Two times a day (BID) | ORAL | Status: DC

## 2016-08-30 NOTE — Discharge Summary (Signed)
Amber MinorKellie S Nielsen, is a 31 y.o. female  DOB 01/05/1986  MRN [id number].  Admission date:  08/26/2016  Admitting Physician  Clydie Braunondell A Smith, MD  Discharge Date:  08/30/2016   Primary MD  Cain SaupeFulp, Cammie, MD  Recommendations for primary care physician for things to follow:   Sepsis secondary to pyelonephritis complicated by nephrolithiasis Blood culture x2 ngtd CT scan 6/23=> no pyelo Keflex 500mg  po bid x 7 days Please f/u with urology regarding ureteral stent  Hypokalemia:  Repleted Cmp in 1 week by PCP  Headache Resolved  Nausea and vomiting secondary to pyelo Resolved   Asthma Stable    Bipolar Continue Celexa   Morbid Obesity: BMI 41  Protein Calorie Malnutrition prostat   Admission Diagnosis  Kidney stone [N20.0] Acute pyelonephritis [N10] Sepsis secondary to UTI (HCC) [A41.9, N39.0]   Discharge Diagnosis  Kidney stone [N20.0] Acute pyelonephritis [N10] Sepsis secondary to UTI (HCC) [A41.9, N39.0]    Principal Problem:   Sepsis secondary to UTI [facility](HCC) Active Problems:   Morbid obesity (HCC)   Hypokalemia   Ureteral stone with hydronephrosis   Acute pyelonephritis   Kidney stone      Past Medical History:  Diagnosis Date  . Anxiety   . Asthma    allergy related. rodent animals  . Bipolar 1 disorder (HCC)   . Bipolar 1 disorder (HCC)   . Depression   . Dizziness 07/03/2013  . Gestational diabetes    diet controlled  . H/O varicella   . Headache(784.0)   . HTN (hypertension)   . IBS (irritable bowel syndrome)   . Kidney stones   . Obesity   . Ovarian cyst   . Pregnancy induced hypertension   . Proteinuria complicating pregnancy     Past Surgical History:  Procedure Laterality Date  . COLONOSCOPY    . CYSTOSCOPY W/ URETERAL STENT PLACEMENT Left 08/26/2016   Procedure: CYSTOSCOPY WITH RETROGRADE PYELOGRAM/URETERAL STENT PLACEMENT;  Surgeon:  [name], Stephen, MD;  Location: [facility] OR;  Service: Urology;  Laterality: Left;  . ibs    . TOOTH EXTRACTION         HPI  from the history and physical done on the day of admission:     31 y.o. female with medical history significant of HTN, asthma, bipolar disorder, obesity, nephrolithiasis and gestational diabetes diet controlled; who presents with complaints of fever and headache since yesterday. Patient has been taking Tylenol without relief of symptoms. She reports associated complaints of left-sided back pain, nausea, vomiting, poor PO intake, cramps, and generalized malaise. Patient is currently also on her menstrual period which started 2 days ago.  ED Course: Upon admission into the emergency department patient was seen to be febrile up to 103.40F, pulse up to 125, and all other vital signs relatively within normal limits. Labs revealed WBC 11.7, potassium 3.2, and lactic acid 1.68. Urinalysis was positive for signs of infection. CT scan of the abdomen revealed a 3 mm distal left ureteral stone with mild left hydronephrosis  and a 2 mm nonobstructing left renal inferior pole stone. Dr. Buck Mam of urology was consulted and recommended emergent cystoscopy with left ureteral stent placement. Patient had been given Rocephin, 2 L of normal saline fluids, Tylenol, Benadryl, ketorolac, and compazine while in the ED. [facility] called to admit.  Review of Systems: As per HPI otherwise 10 point review of systems negative.      Hospital Course:        Pt admitted for UTI, nephrolithiasis.  Pt was febrile up to 103,  S/p cystoscopy with left ureteral stent placement on 6/20 by Emeline Gins.  Pt tx with rocephin 2gm iv qday.  Blood culture x2  6/20=>no growth, Urine culture 6/20=> multiple species.  Pt has been hypokalemic during her stay requiring repletion.  K =3.4 (6/24).  Pt has been afebrile for the past 24 hours.  Pt was anemic which has been stable.  Pt feels like she has been  stable and would like to go home today.    Follow UP  Follow-up Information    Marcine Matar, MD Follow up.   Specialty:  Urology Why:  call after discharge to set up appt with Dr D or Nurse practitioner to discuss stone extraction Contact information: [address] [place] [address] [phone]        Cain Saupe, MD Follow up in 1 week(s).   Specialty:  Family Medicine Contact information: [address] [place] [address] [phone]            Consults obtained - urology  Discharge Condition: stable  Diet and Activity recommendation: See Discharge Instructions below  Discharge Instructions       Discharge Medications     Allergies as of 08/30/2016      Reactions   Celery Oil Swelling   Tongue swelling when eating celery   Cymbalta [duloxetine Hcl] Other (See Comments)   "out of body"   Lamictal [lamotrigine] Other (See Comments)   unknown   Prozac [fluoxetine Hcl] Other (See Comments)   Anger problems      Medication List    TAKE these medications   albuterol 108 (90 Base) MCG/ACT inhaler Commonly known as:  PROVENTIL HFA;VENTOLIN HFA Inhale 2 puffs into the lungs every 6 (six) hours as needed for wheezing or shortness of breath.   cephALEXin 500 MG capsule Commonly known as:  KEFLEX Take 1 capsule (500 mg total) by mouth every 12 (twelve) hours.   citalopram 20 MG tablet Commonly known as:  CELEXA Take 20 mg by mouth daily.   feeding supplement (PRO-STAT SUGAR FREE 64) Liqd Take 30 mLs by mouth 2 (two) times daily.   naproxen 500 MG tablet Commonly known as:  NAPROSYN Take 1 tablet (500 mg total) by mouth 2 (two) times daily.       Major procedures and Radiology Reports - PLEASE review detailed and final reports for all details, in brief -      Dg Chest 2 View  Result Date: 08/26/2016 CLINICAL DATA:  31 year old female with fever, nausea vomiting. EXAM: CHEST  2 VIEW COMPARISON:  Chest radiograph dated  03/08/2013 FINDINGS: The heart size and mediastinal contours are within normal limits. Both lungs are clear. The visualized skeletal structures are unremarkable. IMPRESSION: No active cardiopulmonary disease. Electronically Signed   By: Elgie Collard M.D.   On: 08/26/2016 18:40   Ct Head Wo Contrast  Result Date: 08/26/2016 CLINICAL DATA:  Migraine for 2 days.  Nausea. EXAM: CT HEAD WITHOUT  CONTRAST TECHNIQUE: Contiguous axial images were obtained from the base of the skull through the vertex without intravenous contrast. COMPARISON:  None. FINDINGS: Brain: No acute intracranial abnormality. Specifically, no hemorrhage, hydrocephalus, mass lesion, acute infarction, or significant intracranial injury. Vascular: No hyperdense vessel or unexpected calcification. Skull: No acute calvarial abnormality. Sinuses/Orbits: Visualized paranasal sinuses and mastoids clear. Orbital soft tissues unremarkable. Other: None IMPRESSION: Normal study. Electronically Signed   By: Charlett Nose M.D.   On: 08/26/2016 17:06   Ct Abdomen Pelvis W Contrast  Result Date: 08/29/2016 CLINICAL DATA:  Found to have a ureteral stone, status post cystoscopy and left ureteral stent placement/ 100 cc Isovue 300^146mL ISOVUE-300 IOPAMIDOL (ISOVUE-300) INJECTION 61% EXAM: CT ABDOMEN AND PELVIS WITH CONTRAST TECHNIQUE: Multidetector CT imaging of the abdomen and pelvis was performed using the standard protocol following bolus administration of intravenous contrast. CONTRAST:  ISOVUE-300 IOPAMIDOL (ISOVUE-300) INJECTION 61% COMPARISON:  CT abdomen dated 08/26/2016. FINDINGS: Lower chest: Bilateral pleural effusions, likely small, incompletely imaged. Hepatobiliary: Liver is low in density suggesting fatty infiltration. No focal liver abnormality is seen. Gallbladder is unremarkable. No bile duct dilatation seen. Pancreas: Unremarkable. No pancreatic ductal dilatation or surrounding inflammatory changes. Spleen: Normal in size without  focal abnormality. Adrenals/Urinary Tract: Adrenal glands appear normal. Interval placement of a left-sided nephroureteral stent. Stent appears appropriately positioned with proximal portion coiled in the renal pelvis and the lower portion coiled in the bladder. No significant residual hydronephrosis appreciated. Right kidney is unremarkable without mass, stone or hydronephrosis. Bladder appears normal, partially decompressed. Stomach/Bowel: Bowel is normal in caliber. No bowel wall thickening or evidence of bowel wall inflammation. Appendix is normal. Vascular/Lymphatic: No significant vascular findings are present. No enlarged abdominal or pelvic lymph nodes. Reproductive: Uterus and bilateral adnexa are unremarkable. Other: No free fluid or abscess collection. No free intraperitoneal air. Musculoskeletal: No acute or suspicious osseous finding. Superficial soft tissues are unremarkable. IMPRESSION: 1. Interval placement of a left-sided nephroureteral stent which appears appropriately positioned. No complicating features. No residual hydronephrosis or ureteral stone identified. 2. Pleural effusions at each lung base, new compared to the previous study, both likely small in size but incompletely imaged. 3. Fatty infiltration of the liver. Electronically Signed   By: Bary Richard M.D.   On: 08/29/2016 21:20   Dg Cystogram  Result Date: 08/27/2016 CLINICAL DATA:  Intraoperative cystogram EXAM: CYSTOGRAM COMPARISON:  None. FINDINGS: Cystogram was performed by Dr. Retta Diones. No images were saved for interpretation. IMPRESSION: As above. Electronically Signed   By: Charlett Nose M.D.   On: 08/27/2016 08:13   Ct Renal Stone Study  Result Date: 08/26/2016 CLINICAL DATA:  31 year old female with low back pain and fever. EXAM: CT ABDOMEN AND PELVIS WITHOUT CONTRAST TECHNIQUE: Multidetector CT imaging of the abdomen and pelvis was performed following the standard protocol without IV contrast. COMPARISON:  Abdominal  CT dated 04/06/2016 FINDINGS: Evaluation of this exam is limited in the absence of intravenous contrast. Lower chest: The visualized lung bases are clear. No intra-abdominal free air or free fluid. Hepatobiliary: Diffuse fatty infiltration of the liver. No intrahepatic biliary ductal dilatation. The gallbladder is unremarkable. Pancreas: Unremarkable. No pancreatic ductal dilatation or surrounding inflammatory changes. Spleen: Normal in size without focal abnormality. Adrenals/Urinary Tract: The adrenal glands are unremarkable. There is a 3 mm stone in the distal left ureter close to the left ureterovesical junction. There is mild left hydronephroureter. A 2 mm nonobstructing left renal inferior pole calculus is seen. There is no hydronephrosis or nephrolithiasis on  the right. The right ureter and urinary bladder appear unremarkable. Stomach/Bowel: There are small scattered sigmoid diverticula without active inflammatory changes. There is no evidence of bowel obstruction or active inflammation. Normal appendix. Vascular/Lymphatic: The abdominal aorta and IVC are grossly unremarkable on this noncontrast study. No portal venous gas identified. There is no adenopathy. Reproductive: The uterus is anteverted and grossly unremarkable. The ovaries appear unremarkable as well. No pelvic mass. Other: None Musculoskeletal: No acute or significant osseous findings. IMPRESSION: 1. A 3 mm distal left ureteral stone with mild left hydronephrosis. Correlation with urinalysis recommended to exclude superimposed UTI. A 2 mm nonobstructing left renal inferior pole stone is also noted. 2. Fatty liver. 3. Small scattered colonic diverticula. No bowel obstruction or active inflammation. Normal appendix. Electronically Signed   By: Elgie Collard M.D.   On: 08/26/2016 20:13    Micro Results     Recent Results (from the past 240 hour(s))  Urine culture     Status: Abnormal   Collection Time: 08/26/16  6:06 PM  Result Value Ref  Range Status   Specimen Description URINE, RANDOM  Final   Special Requests Normal  Final   Culture MULTIPLE SPECIES PRESENT, SUGGEST RECOLLECTION (A)  Final   Report Status 08/28/2016 FINAL  Final  Culture, blood (x 2)     Status: None (Preliminary result)   Collection Time: 08/26/16 11:08 PM  Result Value Ref Range Status   Specimen Description BLOOD LEFT ARM  Final   Special Requests   Final    BOTTLES DRAWN AEROBIC AND ANAEROBIC Blood Culture adequate volume   Culture NO GROWTH 3 DAYS  Final   Report Status PENDING  Incomplete  Culture, blood (x 2)     Status: None (Preliminary result)   Collection Time: 08/26/16 11:09 PM  Result Value Ref Range Status   Specimen Description BLOOD LEFT HAND  Final   Special Requests IN PEDIATRIC BOTTLE Blood Culture adequate volume  Final   Culture NO GROWTH 3 DAYS  Final   Report Status PENDING  Incomplete       Today   Subjective    Amber Nielsen today has been afebrile. Denies n/v, flank pain, dysuria, hematuria.  ,no chest abdominal pain,no new weakness tingling or numbness, feels much better wants to go home today.   Objective   Blood pressure 119/77, pulse 67, temperature 97.9 F (36.6 C), temperature source Oral, resp. rate 18, height 5\' 10"  (1.778 m), weight 129.3 kg (285 lb), last menstrual period 08/23/2016, SpO2 90 %.   Intake/Output Summary (Last 24 hours) at 08/30/16 1220 Last data filed at 08/29/16 1731  Gross per 24 hour  Intake          1481.67 ml  Output                0 ml  Net          1481.67 ml    Exam Awake Alert, Oriented x 3, No new F.N deficits, Normal affect [place].AT,PERRAL Supple Neck,No JVD, No cervical lymphadenopathy appriciated.  Symmetrical Chest wall movement, Good air movement bilaterally, CTAB RRR,No Gallops,Rubs or new Murmurs, No Parasternal Heave +ve B.Sounds, Abd Soft, Non tender, No organomegaly appriciated, No rebound -guarding or rigidity. No Cyanosis, Clubbing or edema, No new Rash or  bruise No cva tenderness   Data Review   CBC w Diff:  Lab Results  Component Value Date   WBC 10.4 08/28/2016   HGB 10.4 (L) 08/28/2016   HCT 32.8 (L)  08/28/2016   PLT 272 08/28/2016   LYMPHOPCT 20 04/02/2016   MONOPCT 4 04/02/2016   EOSPCT 1 04/02/2016   BASOPCT 0 04/02/2016    CMP:  Lab Results  Component Value Date   NA 140 08/30/2016   K 3.4 (L) 08/30/2016   CL 107 08/30/2016   CO2 29 08/30/2016   BUN 9 08/30/2016   CREATININE 0.70 08/30/2016   PROT 5.8 (L) 08/30/2016   ALBUMIN 2.3 (L) 08/30/2016   BILITOT 0.2 (L) 08/30/2016   ALKPHOS 47 08/30/2016   AST 16 08/30/2016   ALT 20 08/30/2016  .   Total Time in preparing paper work, data evaluation and todays exam - 35 minutes  Pearson Grippe M.D on 08/30/2016 at 12:20 PM  [facility]   Office  [phone]

## 2016-08-30 NOTE — Progress Notes (Signed)
Patient discharge instructions reviewed with patient. Patient verbalizes understanding. Patient belongings with patient. Patient's family driving her home. Patient discharged via wheelchair. Patient is not in distress.

## 2016-09-01 LAB — CULTURE, BLOOD (ROUTINE X 2)
Culture: NO GROWTH
Culture: NO GROWTH
Special Requests: ADEQUATE
Special Requests: ADEQUATE

## 2016-09-04 ENCOUNTER — Other Ambulatory Visit: Payer: Self-pay | Admitting: Urology

## 2016-09-07 ENCOUNTER — Encounter (HOSPITAL_COMMUNITY): Payer: Self-pay | Admitting: *Deleted

## 2016-09-08 ENCOUNTER — Inpatient Hospital Stay (HOSPITAL_COMMUNITY): Admission: RE | Admit: 2016-09-08 | Payer: Self-pay | Source: Ambulatory Visit

## 2016-09-08 NOTE — Progress Notes (Signed)
Patient had appointment for lab work this am but called and stated she could not make it that she could have labs done morning of surgery. Called and left message to patient that I received message and what time she needed to be here on Monday 09/14/2016 at 1230 pm [facility].

## 2016-09-08 NOTE — Progress Notes (Signed)
Patient called back to let me know she could come in Friday sometime to get labs done. I informed her to just come in on Monday and we will draw labs then.

## 2016-09-11 ENCOUNTER — Encounter (HOSPITAL_COMMUNITY): Payer: BLUE CROSS/BLUE SHIELD

## 2016-09-14 ENCOUNTER — Encounter (HOSPITAL_COMMUNITY): Payer: Self-pay | Admitting: Internal Medicine

## 2016-09-14 ENCOUNTER — Ambulatory Visit (HOSPITAL_COMMUNITY): Payer: Self-pay

## 2016-09-14 ENCOUNTER — Ambulatory Visit (HOSPITAL_COMMUNITY): Payer: Self-pay | Admitting: Anesthesiology

## 2016-09-14 ENCOUNTER — Ambulatory Visit (HOSPITAL_COMMUNITY)
Admission: RE | Admit: 2016-09-14 | Discharge: 2016-09-14 | Disposition: A | Discharge status: Home / Self Care | Payer: Self-pay | Source: Ambulatory Visit | Attending: Urology | Admitting: Urology

## 2016-09-14 ENCOUNTER — Encounter (HOSPITAL_COMMUNITY)
Admission: RE | Disposition: A | Discharge status: Home / Self Care | Payer: Self-pay | Source: Ambulatory Visit | Attending: Urology

## 2016-09-14 DIAGNOSIS — I10 Essential (primary) hypertension: Secondary | ICD-10-CM | POA: Insufficient documentation

## 2016-09-14 DIAGNOSIS — Z6841 Body Mass Index (BMI) 40.0 and over, adult: Secondary | ICD-10-CM | POA: Insufficient documentation

## 2016-09-14 DIAGNOSIS — K589 Irritable bowel syndrome without diarrhea: Secondary | ICD-10-CM | POA: Insufficient documentation

## 2016-09-14 DIAGNOSIS — E669 Obesity, unspecified: Secondary | ICD-10-CM | POA: Insufficient documentation

## 2016-09-14 DIAGNOSIS — F17210 Nicotine dependence, cigarettes, uncomplicated: Secondary | ICD-10-CM | POA: Insufficient documentation

## 2016-09-14 DIAGNOSIS — F419 Anxiety disorder, unspecified: Secondary | ICD-10-CM | POA: Insufficient documentation

## 2016-09-14 DIAGNOSIS — F319 Bipolar disorder, unspecified: Secondary | ICD-10-CM | POA: Insufficient documentation

## 2016-09-14 DIAGNOSIS — N83209 Unspecified ovarian cyst, unspecified side: Secondary | ICD-10-CM | POA: Insufficient documentation

## 2016-09-14 DIAGNOSIS — J45909 Unspecified asthma, uncomplicated: Secondary | ICD-10-CM | POA: Insufficient documentation

## 2016-09-14 DIAGNOSIS — N200 Calculus of kidney: Secondary | ICD-10-CM | POA: Insufficient documentation

## 2016-09-14 HISTORY — PX: CYSTOSCOPY WITH RETROGRADE PYELOGRAM, URETEROSCOPY AND STENT PLACEMENT: SHX5789

## 2016-09-14 LAB — BASIC METABOLIC PANEL
Anion gap: 7 (ref 5–15)
BUN: 13 mg/dL (ref 6–20)
CO2: 27 mmol/L (ref 22–32)
Calcium: 9.1 mg/dL (ref 8.9–10.3)
Chloride: 103 mmol/L (ref 101–111)
Creatinine, Ser: 0.49 mg/dL (ref 0.44–1.00)
GFR calc Af Amer: >60 mL/min (ref >60)
GFR calc non Af Amer: >60 mL/min (ref >60)
Glucose, Bld: 90 mg/dL (ref 65–99)
Potassium: 3.8 mmol/L (ref 3.5–5.1)
Sodium: 137 mmol/L (ref 135–145)

## 2016-09-14 LAB — CBC
HCT: 38.6 % (ref 36.0–46.0)
Hemoglobin: 12.9 g/dL (ref 12.0–15.0)
MCH: 30.4 pg (ref 26.0–34.0)
MCHC: 33.4 g/dL (ref 30.0–36.0)
MCV: 91 fL (ref 78.0–100.0)
Platelets: 434 10*3/uL — ABNORMAL HIGH (ref 150–400)
RBC: 4.24 MIL/uL (ref 3.87–5.11)
RDW: 13.4 % (ref 11.5–15.5)
WBC: 10.4 10*3/uL (ref 4.0–10.5)

## 2016-09-14 LAB — HCG, SERUM, QUALITATIVE: Preg, Serum: NEGATIVE

## 2016-09-14 SURGERY — CYSTOURETEROSCOPY, WITH RETROGRADE PYELOGRAM AND STENT INSERTION
Anesthesia: General | Laterality: Left

## 2016-09-14 MED ORDER — PROPOFOL 10 MG/ML IV BOLUS
INTRAVENOUS | Status: AC
  Filled 2016-09-14: qty 40

## 2016-09-14 MED ORDER — MIDAZOLAM HCL 5 MG/5ML IJ SOLN
INTRAMUSCULAR | Status: DC | PRN
  Administered 2016-09-14: 2 mg via INTRAVENOUS

## 2016-09-14 MED ORDER — FENTANYL CITRATE (PF) 100 MCG/2ML IJ SOLN
INTRAMUSCULAR | Status: DC | PRN
  Administered 2016-09-14 (×3): 50 ug via INTRAVENOUS

## 2016-09-14 MED ORDER — MEPERIDINE HCL 50 MG/ML IJ SOLN: 6.2500 mg | INTRAMUSCULAR | Status: DC | PRN

## 2016-09-14 MED ORDER — METOCLOPRAMIDE HCL 5 MG/ML IJ SOLN: 10.0000 mg | Freq: Once | INTRAMUSCULAR | Status: DC | PRN

## 2016-09-14 MED ORDER — HYDROCODONE-ACETAMINOPHEN 5-325 MG PO TABS: 1.0000 | ORAL_TABLET | Freq: Four times a day (QID) | ORAL | 0 refills | Status: DC | PRN

## 2016-09-14 MED ORDER — FENTANYL CITRATE (PF) 100 MCG/2ML IJ SOLN: 25.0000 ug | INTRAMUSCULAR | Status: DC | PRN

## 2016-09-14 MED ORDER — 0.9 % SODIUM CHLORIDE (POUR BTL) OPTIME
TOPICAL | Status: DC | PRN
  Administered 2016-09-14: 1000 mL

## 2016-09-14 MED ORDER — DEXAMETHASONE SODIUM PHOSPHATE 10 MG/ML IJ SOLN
INTRAMUSCULAR | Status: AC
  Filled 2016-09-14: qty 1

## 2016-09-14 MED ORDER — ONDANSETRON HCL 4 MG/2ML IJ SOLN
INTRAMUSCULAR | Status: DC | PRN
  Administered 2016-09-14: 4 mg via INTRAVENOUS

## 2016-09-14 MED ORDER — PROPOFOL 10 MG/ML IV BOLUS
INTRAVENOUS | Status: DC | PRN
  Administered 2016-09-14: 200 mg via INTRAVENOUS

## 2016-09-14 MED ORDER — LACTATED RINGERS IV SOLN: INTRAVENOUS | Status: DC

## 2016-09-14 MED ORDER — LIDOCAINE HCL (CARDIAC) 10 MG/ML IV SOLN
INTRAVENOUS | Status: DC | PRN
  Administered 2016-09-14: 75 mg via INTRAVENOUS

## 2016-09-14 MED ORDER — ONDANSETRON HCL 4 MG/2ML IJ SOLN
INTRAMUSCULAR | Status: AC
  Filled 2016-09-14: qty 2

## 2016-09-14 MED ORDER — DEXAMETHASONE SODIUM PHOSPHATE 10 MG/ML IJ SOLN
INTRAMUSCULAR | Status: DC | PRN
  Administered 2016-09-14: 10 mg via INTRAVENOUS

## 2016-09-14 MED ORDER — MIDAZOLAM HCL 2 MG/2ML IJ SOLN
INTRAMUSCULAR | Status: AC
  Filled 2016-09-14: qty 2

## 2016-09-14 MED ORDER — SODIUM CHLORIDE 0.9 % IR SOLN
Status: DC | PRN
  Administered 2016-09-14: 4000 mL via INTRAVESICAL

## 2016-09-14 MED ORDER — CIPROFLOXACIN IN D5W 400 MG/200ML IV SOLN
400.0000 mg | INTRAVENOUS | Status: AC
  Administered 2016-09-14: 400 mg via INTRAVENOUS
  Filled 2016-09-14: qty 200

## 2016-09-14 MED ORDER — FENTANYL CITRATE (PF) 250 MCG/5ML IJ SOLN
INTRAMUSCULAR | Status: AC
  Filled 2016-09-14: qty 5

## 2016-09-14 MED ORDER — LACTATED RINGERS IV SOLN
INTRAVENOUS | Status: DC
  Administered 2016-09-14 (×2): via INTRAVENOUS

## 2016-09-14 MED ORDER — TAMSULOSIN HCL 0.4 MG PO CAPS: 0.4000 mg | ORAL_CAPSULE | Freq: Every day | ORAL | 0 refills | Status: AC

## 2016-09-14 SURGICAL SUPPLY — 19 items
BAG URO CATCHER STRL LF (MISCELLANEOUS) ×2 IMPLANT
BASKET LASER NITINOL 1.9FR (BASKET) ×2 IMPLANT
BASKET ZERO TIP NITINOL 2.4FR (BASKET) ×2 IMPLANT
CATH INTERMIT  6FR 70CM (CATHETERS) IMPLANT
CLOTH BEACON ORANGE TIMEOUT ST (SAFETY) ×2 IMPLANT
COVER SURGICAL LIGHT HANDLE (MISCELLANEOUS) ×2 IMPLANT
FIBER LASER FLEXIVA 365 (UROLOGICAL SUPPLIES) IMPLANT
FIBER LASER TRAC TIP (UROLOGICAL SUPPLIES) IMPLANT
GLOVE BIOGEL M 8.0 STRL (GLOVE) ×2 IMPLANT
GOWN STRL REUS W/ TWL XL LVL3 (GOWN DISPOSABLE) ×1 IMPLANT
GOWN STRL REUS W/TWL LRG LVL3 (GOWN DISPOSABLE) ×4 IMPLANT
GOWN STRL REUS W/TWL XL LVL3 (GOWN DISPOSABLE) ×1
GUIDEWIRE ANG ZIPWIRE 038X150 (WIRE) ×2 IMPLANT
GUIDEWIRE STR DUAL SENSOR (WIRE) ×4 IMPLANT
IV NS 1000ML (IV SOLUTION) ×1
IV NS 1000ML BAXH (IV SOLUTION) ×1 IMPLANT
MANIFOLD NEPTUNE II (INSTRUMENTS) ×2 IMPLANT
PACK CYSTO (CUSTOM PROCEDURE TRAY) ×2 IMPLANT
TUBING CONNECTING 10 (TUBING) ×2 IMPLANT

## 2016-09-14 NOTE — Anesthesia Postprocedure Evaluation (Signed)
Anesthesia Post Note  Patient: Amber Nielsen  Procedure(s) Performed: Procedure(s) (LRB): CYSTOSCOPY WITH URETEROSCOPY , STONE EXTRACTION AND STENT REMOVAL (Left)     Patient location during evaluation: PACU Anesthesia Type: General Level of consciousness: awake and alert Pain management: pain level controlled Vital Signs Assessment: post-procedure vital signs reviewed and stable Respiratory status: spontaneous breathing, nonlabored ventilation, respiratory function stable and patient connected to nasal cannula oxygen Cardiovascular status: blood pressure returned to baseline and stable Postop Assessment: no signs of nausea or vomiting Anesthetic complications: no    Last Vitals:  Vitals:   09/14/16 1711 09/14/16 1731  BP: 119/62 123/69  Pulse: 84 89  Resp: 16 16  Temp: 36.7 C 36.8 C    Last Pain:  Vitals:   09/14/16 1731  TempSrc: Axillary  PainSc:                  Phillips Groutarignan, Karo Rog

## 2016-09-14 NOTE — Transfer of Care (Signed)
Immediate Anesthesia Transfer of Care Note  Patient: Amber Nielsen  Procedure(s) Performed: Procedure(s): CYSTOSCOPY WITH URETEROSCOPY , STONE EXTRACTION AND STENT REMOVAL (Left)  Patient Location: PACU  Anesthesia Type:General  Level of Consciousness: awake, alert , oriented and patient cooperative  Airway & Oxygen Therapy: Patient Spontanous Breathing and Patient connected to face mask oxygen  Post-op Assessment: Report given to RN, Post -op Vital signs reviewed and stable and Patient moving all extremities X 4  Post vital signs: stable  Last Vitals:  Vitals:   09/14/16 1301  BP: 121/71  Pulse: 92  Resp: 18  Temp: 37.2 C    Last Pain:  Vitals:   09/14/16 1318  TempSrc:   PainSc: 0-No pain      Patients Stated Pain Goal: 3 (09/14/16 1318)  Complications: No apparent anesthesia complications

## 2016-09-14 NOTE — Interval H&P Note (Signed)
History and Physical Interval Note:  09/14/2016 2:59 PM  Amber Nielsen  has presented today for surgery, with the diagnosis of NEPHROLITHIASIS  The various methods of treatment have been discussed with the patient and family. After consideration of risks, benefits and other options for treatment, the patient has consented to  Procedure(s): CYSTOSCOPY WITH RETROGRADE PYELOGRAM, URETEROSCOPY , STONE EXTRACTION AND STENT PLACEMENT (Left) POSSIBLE HOLMIUM LASER APPLICATION (Left) as a surgical intervention .  The patient's history has been reviewed, patient examined, no change in status, stable for surgery.  I have reviewed the patient's chart and labs.  Questions were answered to the patient's satisfaction.     [name]

## 2016-09-14 NOTE — Anesthesia Procedure Notes (Signed)
Procedure Name: LMA Insertion Date/Time: 09/14/2016 3:46 PM Performed by: Anastasio ChampionEVANS, Jafar Poffenberger E Pre-anesthesia Checklist: Patient identified, Emergency Drugs available, Suction available and Patient being monitored Patient Re-evaluated:Patient Re-evaluated prior to inductionOxygen Delivery Method: Circle system utilized Preoxygenation: Pre-oxygenation with 100% oxygen Intubation Type: IV induction Ventilation: Mask ventilation without difficulty LMA: LMA with gastric port inserted LMA Size: 4.0 Tube type: Oral Number of attempts: 1 Placement Confirmation: positive ETCO2 Tube secured with: Tape Dental Injury: Teeth and Oropharynx as per pre-operative assessment

## 2016-09-14 NOTE — Op Note (Signed)
Operative Note  Date: 09/14/2016   Preoperative Diagnosis: Left nephrolithiasis    Postoperative Diagnosis: Same   Procedure(s) Performed: 1. Cystourethroscopy 2. Left ureteral stent removal 3. Left flexible ureteroscopy with basket extraction 4. Intraoperative fluoroscopy with interpretation, <1 hour  Surgeons: Surgeon(s): [name], Julee Stoll, MD  Resident Assistant: Callie FieldingPauline Filippou, MD  Anesthesia: LMA  Indications:  [name] w/ history of nephrolithiasis who presented 6/20 with infected obstructing left distal ureteral stone. Imaging demonstrated a 3mm left distal ureteral stone, in addition to a 2mm left lower pole non obstructing stone. She underwent left ureteral stent placement on 08/26/16. All cultures were negative, in addition to a preop culture obtained in the office last week. She presents today for left ureteroscopic stone extraction and left ureteral stent exchange.  The risks and benefits of the procedure were discussed with the patient who wishes to proceed.  Findings: Normal cystourethroscopy. Left ureteral stent in place without encrustation. Left kidney ureter demonstrated a single soft ureteral stone, basket extracted and sent for analysis. Thorough pyeloscopy did not demonstrate any additional stones.. All significantly sized stones successfully removed and sent for analysis.  Ureter patent at completion of procedure without any trauma or bullous edema.   Procedure Details:       The patient was correctly identified in the preoperative holding area where written informed consent as well potential risks and complications were reviewed. The patient was brought back to the operative suite where a preinduction timeout was performed. After correct information was verified, general anesthesia was induced via LMA.  The patient was then gently placed in dorsal lithotomy position.  Sequential compression devices were placed for VTE prophylaxis. The patient was prepped and draped  in the usual sterile fashion and appropriate periprocedural antibiotics were administered. A second timeout was performed.         We began by inserting a 22 French rigid cystoscope per urethra with copious lubrication and normal saline irrigation running. The bladder and urethra appeared grossly normal. We turned our attention to the left ureteral orifice, with J stent curl emanating without encrustation. We then grasped the stent tip with a flexible grasper and brought it to the urethral meatus.  This was canulated with a sensor wire without difficulty and stent was removed under fluoroscopic guidance.   We then passed a flexible ureteroscope into the UO and ureter alongside our wire without any resistance. In the distal ureter, we encountered our stone. Using a 0-tip nitinol basket, this was extracted in its entirety and sent for stone analysis. The ureteroscope was then advanced alongside the sensor wire into the renal collecting system and performed pan pyeloscopy as described above. No additional renal stones were noted after thorough pyeloscopy. Multiple Randall plaques were noted.  The patient was felt to be stone-free at the end of the procedure.    We performed a pull-out ureteroscopy which was negative for additional stone fragments. The ureter appeared nicely patent without any evidence of narrowing or trauma. Thus we elected to not leave a ureteral stent.   The bladder was emptied and the patient was awoken from anesthesia and taken to the recovery area.  Estimated Blood Loss:  Minimal         Drains: None         Total IV Fluids: See anesthesia record         Specimens: Stone for stone analysis         Implants: None  Complications:  None         Disposition: PACU - hemodynamically stable.         Condition: stable  Post-Op Plan/Instructions: 1)Discharge home when meets PACU criteria and voids x 1  2)Return to clinic in 6 to 8 weeks with renal ultrasound  beforehand.  Callie Fielding, MD Urologic Surgery Resident   I participated in all aspects of this procedure and agree with the above note

## 2016-09-14 NOTE — H&P (Signed)
Urology Admission H&P  Chief Complaint:  nephrolithiasis  History of Present Illness: [name] w/ history of nephrolithiasis who presented 6/20 with infected obstructing left distal ureteral stone. Imaging demonstrated a 3mm left distal ureteral stone, in addition to a 2mm left lower pole non obstructing stone. She underwent left ureteral stent placement on 08/26/16. All cultures were negative, in addition to a preop culture obtained in the office last week. She presents today for left ureteroscopic stone extraction and left ureteral stent exchange.   Past Medical History:  Diagnosis Date  . Anxiety   . Asthma    allergy related. rodent animals  . Bipolar 1 disorder (HCC)   . Bipolar 1 disorder (HCC)   . Depression   . Dizziness 07/03/2013  . Gestational diabetes    diet controlled  . H/O varicella   . Headache(784.0)   . HTN (hypertension)   . IBS (irritable bowel syndrome)   . Kidney stones   . Obesity   . Ovarian cyst   . Pregnancy induced hypertension   . Proteinuria complicating pregnancy    Past Surgical History:  Procedure Laterality Date  . COLONOSCOPY    . CYSTOSCOPY W/ URETERAL STENT PLACEMENT Left 08/26/2016   Procedure: CYSTOSCOPY WITH RETROGRADE PYELOGRAM/URETERAL STENT PLACEMENT;  Surgeon: [name], Coren Sagan, MD;  [facility];  Service: Urology;  Laterality: Left;  . ibs    . TOOTH EXTRACTION      Home Medications:  No prescriptions prior to admission.   Allergies:  Allergies  Allergen Reactions  . Celery Oil Swelling    Tongue swelling when eating celery  . Cymbalta [Duloxetine Hcl] Other (See Comments)    "out of body"  . Lamictal [Lamotrigine] Other (See Comments)    unknown  . Prozac [Fluoxetine Hcl] Other (See Comments)    Anger problems    Family History  Problem Relation Age of Onset  . Heart disease Father   . Heart attack Father   . Nephrolithiasis Father   . Colon polyps Father   . Mental retardation Maternal Aunt   . Diabetes  Maternal Grandmother   . Cancer Maternal Grandmother        ovarian and breast  . Diabetes Maternal Grandfather   . Diabetes Paternal Grandmother   . Hypertension Paternal Grandmother   . Heart disease Paternal Grandmother   . Colon polyps Paternal Grandmother   . Diabetes Paternal Grandfather   . Colon polyps Paternal Uncle    Social History:  reports that she has been smoking Cigarettes.  She has been smoking about 1.00 pack per day. She has never used smokeless tobacco. She reports that she uses drugs. She reports that she does not drink alcohol.  ROS  Physical Exam:  Vital signs and physical exam to be obtained in preop holding    Physical Exam  Laboratory Data:  No results found for this or any previous visit (from the past 24 hour(s)). No results found for this or any previous visit (from the past 240 hour(s)). Creatinine: No results for input(s): CREATININE in the last 168 hours. Baseline Creatinine: 0.7 (08/30/16)  Impression/Assessment:  Recurrent nephrolithiasis s/p left ureteral stent placement for distal obstructing ureteral stone  Plan:  Left ureteroscopic stone extraction  I agree with the above assessment and plan.  I spoken with the patient about this, and I will participate in this procedure.  FILIPPOU, PAULINE L 09/14/2016, 8:36 AM

## 2016-09-14 NOTE — Interval H&P Note (Signed)
History and Physical Interval Note:  09/14/2016 3:06 PM  Amber Nielsen  has presented today for surgery, with the diagnosis of NEPHROLITHIASIS  The various methods of treatment have been discussed with the patient and family. After consideration of risks, benefits and other options for treatment, the patient has consented to  Procedure(s): CYSTOSCOPY WITH RETROGRADE PYELOGRAM, URETEROSCOPY , STONE EXTRACTION AND STENT PLACEMENT (Left) POSSIBLE HOLMIUM LASER APPLICATION (Left) as a surgical intervention .  The patient's history has been reviewed, patient examined, no change in status, stable for surgery.  I have reviewed the patient's chart and labs.  Questions were answered to the patient's satisfaction.     Chelsea AusDAHLSTEDT, Fatumata Kashani M

## 2016-09-14 NOTE — Anesthesia Preprocedure Evaluation (Signed)
Anesthesia Evaluation  Patient identified by MRN, date of birth, ID band Patient awake    Reviewed: Allergy & Precautions, NPO status , Patient's Chart, lab work & pertinent test results  Airway Mallampati: II  TM Distance: >3 FB Neck ROM: Full    Dental no notable dental hx.    Pulmonary asthma , Current Smoker,    Pulmonary exam normal breath sounds clear to auscultation       Cardiovascular hypertension, Normal cardiovascular exam Rhythm:Regular Rate:Normal     Neuro/Psych negative neurological ROS  negative psych ROS   GI/Hepatic negative GI ROS, Neg liver ROS,   Endo/Other  diabetes, GestationalMorbid obesity  Renal/GU negative Renal ROS  negative genitourinary   Musculoskeletal negative musculoskeletal ROS (+)   Abdominal   Peds negative pediatric ROS (+)  Hematology negative hematology ROS (+)   Anesthesia Other Findings   Reproductive/Obstetrics negative OB ROS                            Anesthesia Physical Anesthesia Plan  ASA: III  Anesthesia Plan: General   Post-op Pain Management:    Induction: Intravenous  PONV Risk Score and Plan: 2 and Ondansetron and Dexamethasone  Airway Management Planned: LMA  Additional Equipment:   Intra-op Plan:   Post-operative Plan: Extubation in OR  Informed Consent: I have reviewed the patients History and Physical, chart, labs and discussed the procedure including the risks, benefits and alternatives for the proposed anesthesia with the patient or authorized representative who has indicated his/her understanding and acceptance.   Dental advisory given  Plan Discussed with: CRNA  Anesthesia Plan Comments:         Anesthesia Quick Evaluation

## 2016-09-14 NOTE — Discharge Instructions (Signed)
1. You may see some blood in the urine and may have some burning with urination for 48-72 hours. You also may notice that you have to urinate more frequently or urgently after your procedure which is normal.  °2. You should call should you develop an inability urinate, fever > 101, persistent nausea and vomiting that prevents you from eating or drinking to stay hydrated.  °3. If you have a stent, you will likely urinate more frequently and urgently until the stent is removed and you may experience some discomfort/pain in the lower abdomen and flank especially when urinating. You may take pain medication prescribed to you if needed for pain. You may also intermittently have blood in the urine until the stent is removed. °4. If you have a catheter, you will be taught how to take care of the catheter by the nursing staff prior to discharge from the hospital.  You may periodically feel a strong urge to void with the catheter in place.  This is a bladder spasm and most often can occur when having a bowel movement or moving around. It is typically self-limited and usually will stop after a few minutes.  You may use some Vaseline or Neosporin around the tip of the catheter to reduce friction at the tip of the penis. You may also see some blood in the urine.  A very small amount of blood can make the urine look quite red.  As long as the catheter is draining well, there usually is not a problem.  However, if the catheter is not draining well and is bloody, you should call the office ([phone]) to notify us. ° ° °General Anesthesia, Adult, Care After °These instructions provide you with information about caring for yourself after your procedure. Your health care provider may also give you more specific instructions. Your treatment has been planned according to current medical practices, but problems sometimes occur. Call your health care provider if you have any problems or questions after your procedure. °What can I  expect after the procedure? °After the procedure, it is common to have: °· Vomiting. °· A sore throat. °· Mental slowness. ° °It is common to feel: °· Nauseous. °· Cold or shivery. °· Sleepy. °· Tired. °· Sore or achy, even in parts of your body where you did not have surgery. ° °Follow these instructions at home: °For at least 24 hours after the procedure: °· Do not: °? Participate in activities where you could fall or become injured. °? Drive. °? Use heavy machinery. °? Drink alcohol. °? Take sleeping pills or medicines that cause drowsiness. °? Make important decisions or sign legal documents. °? Take care of children on your own. °· Rest. °Eating and drinking °· If you vomit, drink water, juice, or soup when you can drink without vomiting. °· Drink enough fluid to keep your urine clear or pale yellow. °· Make sure you have little or no nausea before eating solid foods. °· Follow the diet recommended by your health care provider. °General instructions °· Have a responsible adult stay with you until you are awake and alert. °· Return to your normal activities as told by your health care provider. Ask your health care provider what activities are safe for you. °· Take over-the-counter and prescription medicines only as told by your health care provider. °· If you smoke, do not smoke without supervision. °· Keep all follow-up visits as told by your health care provider. This is important. °Contact a health care provider if: °·   You continue to have nausea or vomiting at home, and medicines are not helpful. °· You cannot drink fluids or start eating again. °· You cannot urinate after 8-12 hours. °· You develop a skin rash. °· You have fever. °· You have increasing redness at the site of your procedure. °Get help right away if: °· You have difficulty breathing. °· You have chest pain. °· You have unexpected bleeding. °· You feel that you are having a life-threatening or urgent problem. °This information is not intended  to replace advice given to you by your health care provider. Make sure you discuss any questions you have with your health care provider. °Document Released: 06/01/2000 Document Revised: 07/29/2015 Document Reviewed: 02/07/2015 °Elsevier Interactive Patient Education © 2018 [employer] ° °

## 2016-09-15 ENCOUNTER — Encounter (HOSPITAL_COMMUNITY): Payer: Self-pay | Admitting: Urology

## 2016-09-24 LAB — STONE ANALYSIS
Ammonium Acid Urate: 25 %
Ca Oxalate,Monohydr.: 15 %
Ca phos cry stone ql IR: 60 %
Stone Weight KSTONE: 6.1 mg

## 2016-12-27 ENCOUNTER — Inpatient Hospital Stay (HOSPITAL_COMMUNITY)
Admission: AD | Admit: 2016-12-27 | Discharge: 2016-12-27 | Disposition: A | Discharge status: Home / Self Care | Payer: Medicaid Other | Source: Ambulatory Visit | Attending: Obstetrics & Gynecology | Admitting: Obstetrics & Gynecology

## 2016-12-27 ENCOUNTER — Inpatient Hospital Stay (HOSPITAL_COMMUNITY)
Admission: AD | Admit: 2016-12-27 | Discharge: 2016-12-27 | Discharge status: Absent without leave | Payer: Self-pay | Attending: Obstetrics & Gynecology | Admitting: Obstetrics & Gynecology

## 2016-12-27 DIAGNOSIS — O26891 Other specified pregnancy related conditions, first trimester: Secondary | ICD-10-CM | POA: Insufficient documentation

## 2016-12-27 DIAGNOSIS — Z3A01 Less than 8 weeks gestation of pregnancy: Secondary | ICD-10-CM | POA: Diagnosis not present

## 2016-12-27 NOTE — MAU Note (Signed)
Had a + home pregnancy test. Switched ob doctors and new doctors wont start progesterone until they receive information from old doctors.  Has history of loss due to low progesterone with 1st.  2 and 3 pregnancy started on provera around 5-6 weeks and had successful outcomes

## 2016-12-27 NOTE — MAU Provider Note (Signed)
Ezequiel GanserKellie Deeb 05/13/1985 reports [id number] CC: wants to be prescribed progesterone as she thinks her levels are low S: Comes to MAU today as she is pregnant and in prenatal care with [facility] [place].  Denies having any pain or bleeding today.  Wants a progesterone level done as she has had low progesterone in previous pregnancies and was given progesterone supplements with her second and third pregnancies which were live births.  Has asked her current OB about this but they are waiting to get her records before prescribing progesterone in this pregnancy.  Client is worried as her first pregnancy was a miscarriage and she had "low progesterone" in that pregnancy also.  Discussed with client that this is considered an emergency room and she does not have physical problems today.  Advised that we would not check her progesterone level today as we are not her prenatal care provider.  Advised her to see her provider.  She has no history under today's account and medical record number - note in chart saying there is a record to merge with today's visit.  Searched her DOB and found record under the same name.  Brief review of client's history - her previous diagnoses include: Kidney stone in July 2018 Pyelonephritis and stent placed in June 2018 Generalized anxiety disorder Depression with behavioral health admission in November 2017 Obesity O: There were no vitals filed for this visit.  Assessment and Plan: Client seen in Triage. Medical screening exam done and client is stable to see her provider. Her best care is to have her labs done at her current provider's office where appropriate follow up can be done rather than an emergency setting.  Nolene Bernheimerri Osborn Pullin, RN, MSN, NP-BC

## 2017-01-05 ENCOUNTER — Inpatient Hospital Stay (HOSPITAL_COMMUNITY): Payer: Medicaid Other

## 2017-01-05 ENCOUNTER — Inpatient Hospital Stay (HOSPITAL_COMMUNITY)
Admission: AD | Admit: 2017-01-05 | Discharge: 2017-01-05 | Disposition: A | Discharge status: Home / Self Care | Payer: Medicaid Other | Source: Ambulatory Visit | Attending: Obstetrics and Gynecology | Admitting: Obstetrics and Gynecology

## 2017-01-05 ENCOUNTER — Encounter (HOSPITAL_COMMUNITY): Payer: Self-pay | Admitting: *Deleted

## 2017-01-05 DIAGNOSIS — Z833 Family history of diabetes mellitus: Secondary | ICD-10-CM | POA: Insufficient documentation

## 2017-01-05 DIAGNOSIS — O26891 Other specified pregnancy related conditions, first trimester: Secondary | ICD-10-CM | POA: Insufficient documentation

## 2017-01-05 DIAGNOSIS — F319 Bipolar disorder, unspecified: Secondary | ICD-10-CM | POA: Insufficient documentation

## 2017-01-05 DIAGNOSIS — Z818 Family history of other mental and behavioral disorders: Secondary | ICD-10-CM | POA: Diagnosis not present

## 2017-01-05 DIAGNOSIS — O99211 Obesity complicating pregnancy, first trimester: Secondary | ICD-10-CM | POA: Diagnosis not present

## 2017-01-05 DIAGNOSIS — Z8619 Personal history of other infectious and parasitic diseases: Secondary | ICD-10-CM | POA: Diagnosis not present

## 2017-01-05 DIAGNOSIS — Z3A01 Less than 8 weeks gestation of pregnancy: Secondary | ICD-10-CM | POA: Insufficient documentation

## 2017-01-05 DIAGNOSIS — Z841 Family history of disorders of kidney and ureter: Secondary | ICD-10-CM | POA: Diagnosis not present

## 2017-01-05 DIAGNOSIS — F419 Anxiety disorder, unspecified: Secondary | ICD-10-CM | POA: Insufficient documentation

## 2017-01-05 DIAGNOSIS — R109 Unspecified abdominal pain: Secondary | ICD-10-CM | POA: Insufficient documentation

## 2017-01-05 DIAGNOSIS — Z888 Allergy status to other drugs, medicaments and biological substances status: Secondary | ICD-10-CM | POA: Insufficient documentation

## 2017-01-05 DIAGNOSIS — Z960 Presence of urogenital implants: Secondary | ICD-10-CM | POA: Diagnosis not present

## 2017-01-05 DIAGNOSIS — O99341 Other mental disorders complicating pregnancy, first trimester: Secondary | ICD-10-CM | POA: Diagnosis not present

## 2017-01-05 DIAGNOSIS — O3680X0 Pregnancy with inconclusive fetal viability, not applicable or unspecified: Secondary | ICD-10-CM

## 2017-01-05 DIAGNOSIS — Z9889 Other specified postprocedural states: Secondary | ICD-10-CM | POA: Diagnosis not present

## 2017-01-05 DIAGNOSIS — Z3201 Encounter for pregnancy test, result positive: Secondary | ICD-10-CM | POA: Insufficient documentation

## 2017-01-05 DIAGNOSIS — Z8249 Family history of ischemic heart disease and other diseases of the circulatory system: Secondary | ICD-10-CM | POA: Insufficient documentation

## 2017-01-05 DIAGNOSIS — Z87442 Personal history of urinary calculi: Secondary | ICD-10-CM | POA: Diagnosis not present

## 2017-01-05 DIAGNOSIS — O9989 Other specified diseases and conditions complicating pregnancy, childbirth and the puerperium: Secondary | ICD-10-CM

## 2017-01-05 DIAGNOSIS — E669 Obesity, unspecified: Secondary | ICD-10-CM | POA: Diagnosis not present

## 2017-01-05 DIAGNOSIS — Z87891 Personal history of nicotine dependence: Secondary | ICD-10-CM | POA: Diagnosis not present

## 2017-01-05 DIAGNOSIS — Z8371 Family history of colonic polyps: Secondary | ICD-10-CM | POA: Insufficient documentation

## 2017-01-05 DIAGNOSIS — O99511 Diseases of the respiratory system complicating pregnancy, first trimester: Secondary | ICD-10-CM | POA: Insufficient documentation

## 2017-01-05 DIAGNOSIS — J45909 Unspecified asthma, uncomplicated: Secondary | ICD-10-CM | POA: Insufficient documentation

## 2017-01-05 DIAGNOSIS — Z803 Family history of malignant neoplasm of breast: Secondary | ICD-10-CM | POA: Insufficient documentation

## 2017-01-05 DIAGNOSIS — Z91018 Allergy to other foods: Secondary | ICD-10-CM | POA: Diagnosis not present

## 2017-01-05 DIAGNOSIS — Z8041 Family history of malignant neoplasm of ovary: Secondary | ICD-10-CM | POA: Insufficient documentation

## 2017-01-05 DIAGNOSIS — R350 Frequency of micturition: Secondary | ICD-10-CM | POA: Diagnosis present

## 2017-01-05 LAB — CBC
HCT: 41.3 % (ref 36.0–46.0)
Hemoglobin: 13.9 g/dL (ref 12.0–15.0)
MCH: 30.5 pg (ref 26.0–34.0)
MCHC: 33.7 g/dL (ref 30.0–36.0)
MCV: 90.6 fL (ref 78.0–100.0)
Platelets: 333 10*3/uL (ref 150–400)
RBC: 4.56 MIL/uL (ref 3.87–5.11)
RDW: 13.4 % (ref 11.5–15.5)
WBC: 14.1 10*3/uL — ABNORMAL HIGH (ref 4.0–10.5)

## 2017-01-05 LAB — POCT PREGNANCY, URINE: Preg Test, Ur: POSITIVE — AB

## 2017-01-05 LAB — URINALYSIS, ROUTINE W REFLEX MICROSCOPIC
Bilirubin Urine: NEGATIVE
Glucose, UA: NEGATIVE mg/dL
Hgb urine dipstick: NEGATIVE
Ketones, ur: NEGATIVE mg/dL
Leukocytes, UA: NEGATIVE
Nitrite: NEGATIVE
Protein, ur: NEGATIVE mg/dL
Specific Gravity, Urine: 1.017 (ref 1.005–1.030)
pH: 6 (ref 5.0–8.0)

## 2017-01-05 LAB — HCG, QUANTITATIVE, PREGNANCY: hCG, Beta Chain, Quant, S: 1152 m[IU]/mL — ABNORMAL HIGH (ref <5)

## 2017-01-05 NOTE — Discharge Instructions (Signed)
Return to care  °· If you have heavier bleeding that soaks through more that 2 pads per hour for an hour or more °· If you bleed so much that you feel like you might pass out or you do pass out °· If you have significant abdominal pain that is not improved with Tylenol  °· If you develop a fever > 100.5 ° ° °Abdominal Pain During Pregnancy °Abdominal pain is common in pregnancy. Most of the time, it does not cause harm. There are many causes of abdominal pain. Some causes are more serious than others and sometimes the cause is not known. Abdominal pain can be a sign that something is very wrong with the pregnancy or the pain may have nothing to do with the pregnancy. Always tell your health care provider if you have any abdominal pain. °Follow these instructions at home: °· Do not have sex or put anything in your vagina until your symptoms go away completely. °· Watch your abdominal pain for any changes. °· Get plenty of rest until your pain improves. °· Drink enough fluid to keep your urine clear or pale yellow. °· Take over-the-counter or prescription medicines only as told by your health care provider. °· Keep all follow-up visits as told by your health care provider. This is important. °Contact a health care provider if: °· You have a fever. °· Your pain gets worse or you have cramping. °· Your pain continues after resting. °Get help right away if: °· You are bleeding, leaking fluid, or passing tissue from the vagina. °· You have vomiting or diarrhea that does not go away. °· You have painful or bloody urination. °· You notice a decrease in your baby's movements. °· You feel very weak or faint. °· You have shortness of breath. °· You develop a severe headache with abdominal pain. °· You have abnormal vaginal discharge with abdominal pain. °This information is not intended to replace advice given to you by your health care provider. Make sure you discuss any questions you have with your health care  provider. °Document Released: 02/23/2005 Document Revised: 12/05/2015 Document Reviewed: 09/22/2012 °Elsevier Interactive Patient Education © 2018 [employer] ° °

## 2017-01-05 NOTE — MAU Note (Addendum)
+  lower left abdominal pain  Radiates to back Ongoing for past couple of days Rating pain 6-7/10 Intermittent in sharp waves Worse with certain movements  Patient expresses concern for possible kidney stones  Increase urine frequency but states only goes a little.

## 2017-01-05 NOTE — MAU Provider Note (Signed)
History     CSN: [id number]  Arrival date and time: 01/05/17 [id number]   First Provider Initiated Contact with Patient 01/05/17 1845      Chief Complaint  Patient presents with  . Abdominal Pain  . Urinary Frequency   HPI  Amber Nielsen is a 31 y.o. [id number] at 4134w1d by LMP who presents with abdominal pain. Symptoms began last week. Reports pain in LLQ that radiates to left lower back. Patient with recent history of kidney stones & concerned that this may be related to kidney stone. Pain is intermittent & described as sharp. Rates pain 6/10. Has not treated. Denies dysuria, hematuria, constipation, n/v/d, vaginal bleeding, or fever/chills.   OB History    Gravida Para Term Preterm AB Living   4 2 2   1 2    SAB TAB Ectopic Multiple Live Births   1       2      Past Medical History:  Diagnosis Date  . Anxiety   . Asthma    allergy related. rodent animals  . Bipolar 1 disorder (HCC)   . Depression   . Gestational diabetes    diet controlled  . H/O varicella   . Headache(784.0)   . HTN (hypertension)   . IBS (irritable bowel syndrome)   . Kidney stones   . Obesity   . Ovarian cyst   . Pregnancy induced hypertension   . Sepsis [facility](HCC)     Past Surgical History:  Procedure Laterality Date  . COLONOSCOPY    . CYSTOSCOPY W/ URETERAL STENT PLACEMENT Left 08/26/2016   Procedure: CYSTOSCOPY WITH RETROGRADE PYELOGRAM/URETERAL STENT PLACEMENT;  Surgeon: [name], Stephen, MD;  [facility];  Service: Urology;  Laterality: Left;  . CYSTOSCOPY WITH RETROGRADE PYELOGRAM, URETEROSCOPY AND STENT PLACEMENT Left 09/14/2016   Procedure: CYSTOSCOPY WITH URETEROSCOPY , STONE EXTRACTION AND STENT REMOVAL;  Surgeon: [name], Stephen, MD;  Location: [facility] ORS;  Service: Urology;  Laterality: Left;  . ibs    . TOOTH EXTRACTION      Family History  Problem Relation Age of Onset  . Heart disease Father   . Heart attack Father   . Nephrolithiasis Father   . Colon polyps Father   . Mental  retardation Maternal Aunt   . Diabetes Maternal Grandmother   . Cancer Maternal Grandmother        ovarian and breast  . Diabetes Maternal Grandfather   . Diabetes Paternal Grandmother   . Hypertension Paternal Grandmother   . Heart disease Paternal Grandmother   . Colon polyps Paternal Grandmother   . Diabetes Paternal Grandfather   . Colon polyps Paternal Uncle     Social History  Substance Use Topics  . Smoking status: Former Smoker    Packs/day: 1.00    Types: Cigarettes    Quit date: 12/21/2016  . Smokeless tobacco: Never Used  . Alcohol use No    Allergies:  Allergies  Allergen Reactions  . Celery Oil Swelling    Tongue swelling when eating celery  . Cymbalta [Duloxetine Hcl] Other (See Comments)    "out of body"  . Lamictal [Lamotrigine] Other (See Comments)    unknown  . Prozac [Fluoxetine Hcl] Other (See Comments)    Anger problems    Prescriptions Prior to Admission  Medication Sig Dispense Refill Last Dose  . albuterol (PROVENTIL HFA;VENTOLIN HFA) 108 (90 Base) MCG/ACT inhaler Inhale 2 puffs into the lungs every 6 (six) hours as needed for wheezing or shortness of breath.  Past Week at Unknown time  . Amino Acids-Protein Hydrolys (FEEDING SUPPLEMENT, PRO-STAT SUGAR FREE 64,) LIQD Take 30 mLs by mouth 2 (two) times daily. 900 mL 0   . citalopram (CELEXA) 20 MG tablet Take 20 mg by mouth daily.   09/13/2016 at Unknown time  . HYDROcodone-acetaminophen (NORCO/VICODIN) 5-325 MG tablet Take 1-2 tablets by mouth every 6 (six) hours as needed for moderate pain. 15 tablet 0   . naproxen (NAPROSYN) 500 MG tablet Take 1 tablet (500 mg total) by mouth 2 (two) times daily. 30 tablet 0 Past Week at Unknown time    Review of Systems  Constitutional: Negative.   Gastrointestinal: Positive for abdominal pain. Negative for abdominal distention, constipation, diarrhea, nausea and vomiting.  Genitourinary: Positive for flank pain. Negative for difficulty urinating, dysuria,  frequency, hematuria, vaginal bleeding and vaginal discharge.  Musculoskeletal: Positive for back pain.   Physical Exam   Blood pressure 133/75, pulse 99, temperature 98.5 F (36.9 C), temperature source Oral, resp. rate 18, weight 299 lb 1.3 oz (135.7 kg), last menstrual period 11/23/2016, SpO2 100 %.  Physical Exam  Nursing note and vitals reviewed. Constitutional: She is oriented to person, place, and time. She appears well-developed and well-nourished. No distress.  HENT:  Head: Normocephalic and atraumatic.  Eyes: Conjunctivae are normal. Right eye exhibits no discharge. Left eye exhibits no discharge. No scleral icterus.  Neck: Normal range of motion.  Respiratory: Effort normal. No respiratory distress.  GI: Soft. She exhibits no distension. There is no tenderness. There is no rebound, no guarding and no CVA tenderness.  Neurological: She is alert and oriented to person, place, and time.  Skin: Skin is warm and dry. She is not diaphoretic.  Psychiatric: She has a normal mood and affect. Her behavior is normal. Judgment and thought content normal.    MAU Course  Procedures Results for orders placed or performed during the hospital encounter of 01/05/17 (from the past 24 hour(s))  Urinalysis, Routine w reflex microscopic     Status: None   Collection Time: 01/05/17  6:18 PM  Result Value Ref Range   Color, Urine YELLOW YELLOW   APPearance CLEAR CLEAR   Specific Gravity, Urine 1.017 1.005 - 1.030   pH 6.0 5.0 - 8.0   Glucose, UA NEGATIVE NEGATIVE mg/dL   Hgb urine dipstick NEGATIVE NEGATIVE   Bilirubin Urine NEGATIVE NEGATIVE   Ketones, ur NEGATIVE NEGATIVE mg/dL   Protein, ur NEGATIVE NEGATIVE mg/dL   Nitrite NEGATIVE NEGATIVE   Leukocytes, UA NEGATIVE NEGATIVE  Pregnancy, urine POC     Status: Abnormal   Collection Time: 01/05/17  6:30 PM  Result Value Ref Range   Preg Test, Ur POSITIVE (A) NEGATIVE  CBC     Status: Abnormal   Collection Time: 01/05/17  7:12 PM   Result Value Ref Range   WBC 14.1 (H) 4.0 - 10.5 K/uL   RBC 4.56 3.87 - 5.11 MIL/uL   Hemoglobin 13.9 12.0 - 15.0 g/dL   HCT 69.6 29.5 - 28.4 %   MCV 90.6 78.0 - 100.0 fL   MCH 30.5 26.0 - 34.0 pg   MCHC 33.7 30.0 - 36.0 g/dL   RDW 13.2 44.0 - 10.2 %   Platelets 333 150 - 400 K/uL  hCG, quantitative, pregnancy     Status: Abnormal   Collection Time: 01/05/17  7:12 PM  Result Value Ref Range   hCG, Beta Chain, Quant, S 1,152 (H) <5 mIU/mL   US Ob Comp Less 14  Wks  Result Date: 01/05/2017 CLINICAL DATA:  Left lower quadrant pain x2 days. Positive urine pregnancy test. EXAM: OBSTETRIC <14 WK [place] AND TRANSVAGINAL OB US TECHNIQUE: Both transabdominal and transvaginal ultrasound examinations were performed for complete evaluation of the gestation as well as the maternal uterus, adnexal regions, and pelvic cul-de-sac. Transvaginal technique was performed to assess early pregnancy. COMPARISON:  None. FINDINGS: Exam was limited by patient body habitus. Intrauterine gestational sac: Probable single Yolk sac:  Not Visualized. Embryo:  Not Visualized. Cardiac Activity: Not applicable Heart Rate: Not applicable MSD: 4.8  mm   5 w 1  d Subchorionic hemorrhage:  None visualized. Maternal uterus/adnexae: Normal right ovary. Left ovary was not visualized. IMPRESSION: Exam limited by patient body habitus. Probable early intrauterine gestational sac, but no yolk sac, fetal pole, or cardiac activity yet visualized. Recommend follow-up quantitative B-HCG levels and follow-up US in 14 days to assess viability. This recommendation follows SRU consensus guidelines: Diagnostic Criteria for Nonviable Pregnancy Early in the First Trimester. Malva Limes Med 2013; 409:8119-14. Electronically Signed   By: Tollie Eth M.D.   On: 01/05/2017 20:07   US Ob Transvaginal  Result Date: 01/05/2017 CLINICAL DATA:  Left lower quadrant pain x2 days. Positive urine pregnancy test. EXAM: OBSTETRIC <14 WK [place] AND TRANSVAGINAL OB US  TECHNIQUE: Both transabdominal and transvaginal ultrasound examinations were performed for complete evaluation of the gestation as well as the maternal uterus, adnexal regions, and pelvic cul-de-sac. Transvaginal technique was performed to assess early pregnancy. COMPARISON:  None. FINDINGS: Exam was limited by patient body habitus. Intrauterine gestational sac: Probable single Yolk sac:  Not Visualized. Embryo:  Not Visualized. Cardiac Activity: Not applicable Heart Rate: Not applicable MSD: 4.8  mm   5 w 1  d Subchorionic hemorrhage:  None visualized. Maternal uterus/adnexae: Normal right ovary. Left ovary was not visualized. IMPRESSION: Exam limited by patient body habitus. Probable early intrauterine gestational sac, but no yolk sac, fetal pole, or cardiac activity yet visualized. Recommend follow-up quantitative B-HCG levels and follow-up US in 14 days to assess viability. This recommendation follows SRU consensus guidelines: Diagnostic Criteria for Nonviable Pregnancy Early in the First Trimester. Malva Limes Med 2013; 782:9562-13. Electronically Signed   By: Tollie Eth M.D.   On: 01/05/2017 20:07    MDM +UPT UA, wet prep, GC/chlamydia, CBC, ABO/Rh, quant hCG, HIV, and [place] today to rule out ectopic pregnancy U/a negative -- no CVAT O positive Ultrasound shows ?IUGS, no yolk sac, no adnexal mass. BHCG <2000. Will have pt return for repeat BHCG Assessment and Plan  A:  1. Pregnancy of unknown anatomic location   2. Abdominal pain during pregnancy in first trimester    P; Discharge home Discussed reasons to return to MAU including s/s ectopic pregnancy This abdominal pain could represent a normal pregnancy, spontaneous abortion, or even an ectopic pregnancy which can be life-threatening.  Go to [facility] Friday morning for repeat BHCG  Judeth Horn 01/05/2017, 6:59 PM

## 2017-01-08 ENCOUNTER — Inpatient Hospital Stay (HOSPITAL_COMMUNITY)
Admission: AD | Admit: 2017-01-08 | Discharge: 2017-01-08 | Disposition: A | Discharge status: Home / Self Care | Payer: Medicaid Other | Source: Ambulatory Visit | Attending: Obstetrics & Gynecology | Admitting: Obstetrics & Gynecology

## 2017-01-08 ENCOUNTER — Ambulatory Visit: Payer: Self-pay

## 2017-01-08 ENCOUNTER — Inpatient Hospital Stay (HOSPITAL_COMMUNITY): Payer: Medicaid Other

## 2017-01-08 ENCOUNTER — Telehealth: Payer: Self-pay | Admitting: Family Medicine

## 2017-01-08 DIAGNOSIS — O26891 Other specified pregnancy related conditions, first trimester: Secondary | ICD-10-CM | POA: Insufficient documentation

## 2017-01-08 DIAGNOSIS — Z87442 Personal history of urinary calculi: Secondary | ICD-10-CM | POA: Insufficient documentation

## 2017-01-08 DIAGNOSIS — Z8371 Family history of colonic polyps: Secondary | ICD-10-CM | POA: Diagnosis not present

## 2017-01-08 DIAGNOSIS — Z3A01 Less than 8 weeks gestation of pregnancy: Secondary | ICD-10-CM | POA: Diagnosis not present

## 2017-01-08 DIAGNOSIS — Z8249 Family history of ischemic heart disease and other diseases of the circulatory system: Secondary | ICD-10-CM | POA: Diagnosis not present

## 2017-01-08 DIAGNOSIS — Z87891 Personal history of nicotine dependence: Secondary | ICD-10-CM | POA: Diagnosis not present

## 2017-01-08 DIAGNOSIS — Z888 Allergy status to other drugs, medicaments and biological substances status: Secondary | ICD-10-CM | POA: Insufficient documentation

## 2017-01-08 DIAGNOSIS — M545 Low back pain: Secondary | ICD-10-CM | POA: Diagnosis not present

## 2017-01-08 DIAGNOSIS — Z3491 Encounter for supervision of normal pregnancy, unspecified, first trimester: Secondary | ICD-10-CM

## 2017-01-08 DIAGNOSIS — R1032 Left lower quadrant pain: Secondary | ICD-10-CM | POA: Insufficient documentation

## 2017-01-08 DIAGNOSIS — R109 Unspecified abdominal pain: Secondary | ICD-10-CM

## 2017-01-08 LAB — HCG, QUANTITATIVE, PREGNANCY: hCG, Beta Chain, Quant, S: 1716 m[IU]/mL — ABNORMAL HIGH (ref <5)

## 2017-01-08 NOTE — Telephone Encounter (Signed)
Patient called to say she was at work, and didn't get off until 2pm. Spoke with Wilburt FinlayJanice RN, and she told me to inform the patient to go to MAU when she gets off from work.

## 2017-01-08 NOTE — MAU Provider Note (Signed)
Chief Complaint:  Follow-up   None     HPI: Amber Nielsen is a 31 y.o. [id number] at [redacted]w[redacted]d who presents to maternity admissions for repeat hcg and reports some intermittent LLQ abdominal pain and left lower back pain since she was last seen in the MAU.  On 01/05/17, quant hcg was 1152 and US showed GS but no YS or FP. She reports good fetal movement, denies LOF, vaginal bleeding, vaginal itching/burning, urinary symptoms, h/a, dizziness, n/v, or fever/chills.    HPI  Past Medical History: Past Medical History:  Diagnosis Date  . Anxiety   . Asthma    allergy related. rodent animals  . Bipolar 1 disorder (HCC)   . Depression   . Gestational diabetes    diet controlled  . H/O varicella   . Headache(784.0)   . HTN (hypertension)   . IBS (irritable bowel syndrome)   . Kidney stones   . Obesity   . Ovarian cyst   . Pregnancy induced hypertension   . Sepsis (HCC)     Past obstetric history: OB History  Gravida Para Term Preterm AB Living  4 2 2   1 2   SAB TAB Ectopic Multiple Live Births  1       2    # Outcome Date GA Lbr Len/2nd Weight Sex Delivery Anes PTL Lv  4 Current           3 Term 11/05/11 [redacted]w[redacted]d -14:21 / 00:06 8 lb 9.7 oz (3.905 kg) F Vag-Spont None  LIV  2 Term 2011 [redacted]w[redacted]d 06:00 10 lb 2 oz (4.593 kg) F Vag-Spont EPI  LIV  1 SAB 2010              Past Surgical History: Past Surgical History:  Procedure Laterality Date  . COLONOSCOPY    . CYSTOSCOPY W/ URETERAL STENT PLACEMENT Left 08/26/2016   Procedure: CYSTOSCOPY WITH RETROGRADE PYELOGRAM/URETERAL STENT PLACEMENT;  Surgeon: Marcine Matar, MD;  [facility];  Service: Urology;  Laterality: Left;  . CYSTOSCOPY WITH RETROGRADE PYELOGRAM, URETEROSCOPY AND STENT PLACEMENT Left 09/14/2016   Procedure: CYSTOSCOPY WITH URETEROSCOPY , STONE EXTRACTION AND STENT REMOVAL;  Surgeon: Marcine Matar, MD;  Location: [facility] ORS;  Service: Urology;  Laterality: Left;  . ibs    . TOOTH EXTRACTION      Family  History: Family History  Problem Relation Age of Onset  . Heart disease Father   . Heart attack Father   . Nephrolithiasis Father   . Colon polyps Father   . Mental retardation Maternal Aunt   . Diabetes Maternal Grandmother   . Cancer Maternal Grandmother        ovarian and breast  . Diabetes Maternal Grandfather   . Diabetes Paternal Grandmother   . Hypertension Paternal Grandmother   . Heart disease Paternal Grandmother   . Colon polyps Paternal Grandmother   . Diabetes Paternal Grandfather   . Colon polyps Paternal Uncle     Social History: Social History  Substance Use Topics  . Smoking status: Former Smoker    Packs/day: 1.00    Types: Cigarettes    Quit date: 12/21/2016  . Smokeless tobacco: Never Used  . Alcohol use No    Allergies:  Allergies  Allergen Reactions  . Celery Oil Swelling    Tongue swelling when eating celery  . Cymbalta [Duloxetine Hcl] Other (See Comments)    "out of body"  . Lamictal [Lamotrigine] Other (See Comments)    unknown  . Prozac [Fluoxetine Hcl]  Other (See Comments)    Anger problems    Meds:  No prescriptions prior to admission.    ROS:  Review of Systems  Constitutional: Negative for chills, fatigue and fever.  Respiratory: Negative for shortness of breath.   Cardiovascular: Negative for chest pain.  Gastrointestinal: Positive for abdominal pain.  Genitourinary: Positive for pelvic pain. Negative for difficulty urinating, dysuria, flank pain, vaginal bleeding, vaginal discharge and vaginal pain.  Neurological: Negative for dizziness and headaches.  Psychiatric/Behavioral: Negative.      I have reviewed patient's Past Medical Hx, Surgical Hx, Family Hx, Social Hx, medications and allergies.   Physical Exam   Patient Vitals for the past 24 hrs:  BP Temp Temp src Pulse Resp  01/08/17 1535 124/70 98.5 F (36.9 C) Oral (!) 105 16   Constitutional: Well-developed, well-nourished female in no acute distress.   Cardiovascular: normal rate Respiratory: normal effort GI: Abd soft, non-tender, gravid appropriate for gestational age.  MS: Extremities nontender, no edema, normal ROM Neurologic: Alert and oriented x 4.  GU: Neg CVAT.       Labs: Results for orders placed or performed during the hospital encounter of 01/08/17 (from the past 24 hour(s))  hCG, quantitative, pregnancy     Status: Abnormal   Collection Time: 01/08/17  3:48 PM  Result Value Ref Range   hCG, Beta Chain, Quant, S 1,716 (H) <5 mIU/mL      Imaging:  [place] Ob Comp Less 14 Wks  Result Date: 01/05/2017 CLINICAL DATA:  Left lower quadrant pain x2 days. Positive urine pregnancy test. EXAM: OBSTETRIC <14 WK US AND TRANSVAGINAL OB US TECHNIQUE: Both transabdominal and transvaginal ultrasound examinations were performed for complete evaluation of the gestation as well as the maternal uterus, adnexal regions, and pelvic cul-de-sac. Transvaginal technique was performed to assess early pregnancy. COMPARISON:  None. FINDINGS: Exam was limited by patient body habitus. Intrauterine gestational sac: Probable single Yolk sac:  Not Visualized. Embryo:  Not Visualized. Cardiac Activity: Not applicable Heart Rate: Not applicable MSD: 4.8  mm   5 w 1  d Subchorionic hemorrhage:  None visualized. Maternal uterus/adnexae: Normal right ovary. Left ovary was not visualized. IMPRESSION: Exam limited by patient body habitus. Probable early intrauterine gestational sac, but no yolk sac, fetal pole, or cardiac activity yet visualized. Recommend follow-up quantitative B-HCG levels and follow-up US in 14 days to assess viability. This recommendation follows SRU consensus guidelines: Diagnostic Criteria for Nonviable Pregnancy Early in the First Trimester. Malva Limes Engl J Med 2013; 161:0960-45; 369:1443-51. Electronically Signed   By: Tollie Ethavid  Kwon M.D.   On: 01/05/2017 20:07   [place] Ob Transvaginal  Result Date: 01/08/2017 CLINICAL DATA:  Persistent pain. Inappropriate rise in the  quantitative beta HCG EXAM: TRANSVAGINAL OB ULTRASOUND TECHNIQUE: Transvaginal ultrasound was performed for complete evaluation of the gestation as well as the maternal uterus, adnexal regions, and pelvic cul-de-sac. COMPARISON:  Pelvic ultrasound 01/05/2017. FINDINGS: Intrauterine gestational sac: Single. Yolk sac:  Present Embryo:  Present Cardiac Activity: Present Heart Rate: 86 bpm CRL:   2.0 Too small to calculate estimated gestational age. Subchorionic hemorrhage:  None visualized. Maternal uterus/adnexae: IMPRESSION: 1. The yolk sac and embryo are now present. 2. Cardiac activity is noted with a fetal heart rate at 86. Electronically Signed   By: Marin Robertshristopher  Mattern M.D.   On: 01/08/2017 19:24   [place] Ob Transvaginal  Result Date: 01/05/2017 CLINICAL DATA:  Left lower quadrant pain x2 days. Positive urine pregnancy test. EXAM: OBSTETRIC <14 WK US AND TRANSVAGINAL  OB US TECHNIQUE: Both transabdominal and transvaginal ultrasound examinations were performed for complete evaluation of the gestation as well as the maternal uterus, adnexal regions, and pelvic cul-de-sac. Transvaginal technique was performed to assess early pregnancy. COMPARISON:  None. FINDINGS: Exam was limited by patient body habitus. Intrauterine gestational sac: Probable single Yolk sac:  Not Visualized. Embryo:  Not Visualized. Cardiac Activity: Not applicable Heart Rate: Not applicable MSD: 4.8  mm   5 w 1  d Subchorionic hemorrhage:  None visualized. Maternal uterus/adnexae: Normal right ovary. Left ovary was not visualized. IMPRESSION: Exam limited by patient body habitus. Probable early intrauterine gestational sac, but no yolk sac, fetal pole, or cardiac activity yet visualized. Recommend follow-up quantitative B-HCG levels and follow-up US in 14 days to assess viability. This recommendation follows SRU consensus guidelines: Diagnostic Criteria for Nonviable Pregnancy Early in the First Trimester. Malva Limes Med 2013; 161:0960-45.  Electronically Signed   By: Tollie Eth M.D.   On: 01/05/2017 20:07    MAU Course/MDM: I have ordered labs and reviewed results.  Quant hcg did not rise appropriately and pt reported increase in pain so transvaginal US repeated today [place] indicates IUP with low FHR of 86 Presented results to pt today, discussed abnormal labs and low FHR but clear indication of growth of the pregnancy on [place]  Pt to f/u at [facility] as scheduled on Tuesday Return to MAU as needed for emergencies Pt discharge with strict miscarriage precautions.   Assessment: 1. Normal IUP (intrauterine pregnancy) on prenatal ultrasound, first trimester   2. Abdominal pain during pregnancy in first trimester     Plan: Discharge home  Follow-up Information    [facility] Follow up.   Specialty:  Obstetrics and Gynecology Why:  As scheduled on Tuesday, return to MAU as needed for emergencies Contact information: [address] [place] [address] [phone]         Allergies as of 01/08/2017      Reactions   Celery Oil Swelling   Tongue swelling when eating celery   Cymbalta [duloxetine Hcl] Other (See Comments)   "out of body"   Lamictal [lamotrigine] Other (See Comments)   unknown   Prozac [fluoxetine Hcl] Other (See Comments)   Anger problems      Medication List    TAKE these medications   albuterol 108 (90 Base) MCG/ACT inhaler Commonly known as:  PROVENTIL HFA;VENTOLIN HFA Inhale 2 puffs into the lungs every 6 (six) hours as needed for wheezing or shortness of breath.   citalopram 20 MG tablet Commonly known as:  CELEXA Take 20 mg by mouth daily.   prenatal multivitamin Tabs tablet Take 1 tablet by mouth daily at 12 noon.       Sharen Counter Certified Nurse-Midwife 01/08/2017 8:39 PM

## 2017-01-08 NOTE — MAU Note (Signed)
Sharen CounterLisa Leftwich-Kirby CNM in Triage to discuss test results and d/c plan. Pt d/c home from Triage.

## 2017-01-08 NOTE — MAU Note (Signed)
Pt here for repeat quant.  Pt denies pain or bleeding. 

## 2017-01-12 ENCOUNTER — Ambulatory Visit (INDEPENDENT_AMBULATORY_CARE_PROVIDER_SITE_OTHER): Payer: Medicaid Other | Admitting: Obstetrics & Gynecology

## 2017-01-12 ENCOUNTER — Encounter: Payer: Self-pay | Admitting: Obstetrics & Gynecology

## 2017-01-12 ENCOUNTER — Encounter: Payer: Self-pay | Admitting: *Deleted

## 2017-01-12 DIAGNOSIS — Z3492 Encounter for supervision of normal pregnancy, unspecified, second trimester: Secondary | ICD-10-CM

## 2017-01-12 DIAGNOSIS — Z113 Encounter for screening for infections with a predominantly sexual mode of transmission: Secondary | ICD-10-CM | POA: Diagnosis not present

## 2017-01-12 DIAGNOSIS — Z8632 Personal history of gestational diabetes: Secondary | ICD-10-CM

## 2017-01-12 DIAGNOSIS — Z8759 Personal history of other complications of pregnancy, childbirth and the puerperium: Secondary | ICD-10-CM

## 2017-01-12 DIAGNOSIS — Z3687 Encounter for antenatal screening for uncertain dates: Secondary | ICD-10-CM

## 2017-01-12 DIAGNOSIS — Z3481 Encounter for supervision of other normal pregnancy, first trimester: Secondary | ICD-10-CM

## 2017-01-12 DIAGNOSIS — Z3480 Encounter for supervision of other normal pregnancy, unspecified trimester: Secondary | ICD-10-CM | POA: Insufficient documentation

## 2017-01-12 HISTORY — DX: Personal history of gestational diabetes: Z86.32

## 2017-01-12 HISTORY — DX: Personal history of other complications of pregnancy, childbirth and the puerperium: Z87.59

## 2017-01-12 MED ORDER — CITALOPRAM HYDROBROMIDE 20 MG PO TABS: 20.0000 mg | ORAL_TABLET | Freq: Every day | ORAL | 2 refills | Status: DC

## 2017-01-12 MED ORDER — PROGESTERONE MICRONIZED 100 MG PO CAPS: 100.0000 mg | ORAL_CAPSULE | Freq: Every day | ORAL | 0 refills | Status: DC

## 2017-01-12 NOTE — Progress Notes (Signed)
  Subjective:    Amber Nielsen is being seen today for her first obstetrical visit.   She is at 2838w0d gestation. Her obstetrical history is significant for obesity and h/o pre eclampsia, bipolar disorder, h/o GDM.  Patient does intend to breast feed. Pregnancy history fully reviewed.  Patient reports no complaints.  Review of Systems:   Review of Systems  Objective:     BP 114/75   Pulse 82   Wt 297 lb (134.7 kg)   LMP 11/23/2016   BMI 42.62 kg/m  Physical Exam  Exam    Assessment:    Pregnancy: [id number] Patient Active Problem List   Diagnosis Date Noted  . Supervision of other normal pregnancy, antepartum 01/12/2017  . Kidney stone   . Hypokalemia 08/27/2016  . Ureteral stone with hydronephrosis 08/27/2016  . Acute pyelonephritis   . Sepsis secondary to UTI (HCC) 08/26/2016  . 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 Cannabis abuse 02/02/2016  . Morbid obesity (HCC) 12/15/2012       Plan:     Initial labs drawn. Prenatal vitamins. Problem list reviewed and updated. AFP3 discussed: undecided. Role of ultrasound in pregnancy discussed; fetal survey: requested. Amniocentesis discussed: not indicated. Follow up in 1 week for repeat u/s due to FHR of 73. She wants a flu vaccine but plans to get it later. She wants to restart Celexa, aware that it is a Class C drug, took zoloft with other pregnancy but likes celexa better.  [name] 01/12/2017

## 2017-01-12 NOTE — Progress Notes (Signed)
DATING AND VIABILITY SONOGRAM   Ezequiel GanserKellie Ellenbecker is a 31 y.o. year old 534P2012 with LMP Patient's last menstrual period was 11/23/2016. which would correlate to  4728w1d weeks gestation.  She has irregular menstrual cycles.   She is here today for a confirmatory initial sonogram.    GESTATION: SINGLETON     FETAL ACTIVITY:          Heart rate: 73bpm           GESTATIONAL AGE AND  BIOMETRICS:  Gestational criteria: Estimated Date of Delivery: 08/30/17 by early ultrasound now at 1428w1d   CROWN RUMP LENGTH           0.329cm         6.0 weeks                                                                               AVERAGE EGA(BY THIS SCAN):  6.0 weeks  WORKING EDD( early ultrasound ):  08/30/2017     TECHNICIAN COMMENTS:  SLIUP measuring 6.0wks by CRL with FHR 73bpm Recommend repeat scan in 1 week   A copy of this report including all images has been saved and backed up to a second source for retrieval if needed. All measures and details of the anatomical scan, placentation, fluid volume and pelvic anatomy are contained in that report.  Ellakate Gonsalves 01/12/2017 9:18 AM

## 2017-01-12 NOTE — Progress Notes (Signed)
Stopped Celexa when she found out she was pregnant - wants to see if she can continue. She took Zoloft last pregnancy but states "it did not help all issues".   Hx - preeclampsia, gdm, preterm contractions, progesterone in 1st trimester.

## 2017-01-13 LAB — GC/CHLAMYDIA PROBE AMP (~~LOC~~) NOT AT ARMC
Chlamydia: NEGATIVE
Neisseria Gonorrhea: NEGATIVE

## 2017-01-13 LAB — PROTEIN / CREATININE RATIO, URINE
Creatinine, Urine: 88.1 mg/dL
Protein, Ur: 9.9 mg/dL
Protein/Creat Ratio: 112 mg/g creat (ref 0–200)

## 2017-01-14 LAB — URINE CULTURE, OB REFLEX

## 2017-01-14 LAB — CULTURE, OB URINE

## 2017-01-14 LAB — PROTEIN, URINE, 24 HOUR
Protein, 24H Urine: 150 mg/24 hr (ref 30–150)
Protein, Ur: 7.9 mg/dL

## 2017-01-15 ENCOUNTER — Encounter: Payer: Self-pay | Admitting: Certified Nurse Midwife

## 2017-01-19 ENCOUNTER — Other Ambulatory Visit: Payer: Self-pay

## 2017-01-19 ENCOUNTER — Ambulatory Visit (HOSPITAL_COMMUNITY)
Admission: RE | Admit: 2017-01-19 | Discharge: 2017-01-19 | Disposition: A | Discharge status: Home / Self Care | Payer: Medicaid Other | Source: Ambulatory Visit | Attending: Obstetrics & Gynecology | Admitting: Obstetrics & Gynecology

## 2017-01-19 ENCOUNTER — Encounter: Payer: Self-pay | Admitting: *Deleted

## 2017-01-19 ENCOUNTER — Ambulatory Visit: Payer: Self-pay | Admitting: *Deleted

## 2017-01-19 DIAGNOSIS — Z3480 Encounter for supervision of other normal pregnancy, unspecified trimester: Secondary | ICD-10-CM

## 2017-01-19 DIAGNOSIS — Z3A01 Less than 8 weeks gestation of pregnancy: Secondary | ICD-10-CM | POA: Diagnosis not present

## 2017-01-19 NOTE — Addendum Note (Signed)
Addended by: Arne ClevelandHUTCHINSON, MANDY J on: 01/19/2017 03:14 PM   Modules accepted: Orders

## 2017-01-19 NOTE — Progress Notes (Signed)
DATING AND VIABILITY SONOGRAM   [name] is a 31 y.o. year old 394P2012 with LMP Patient's last menstrual period was 11/23/2016. which would correlate to  6669w0d weeks gestation.  She has regular menstrual cycles.   She is here today for a confirmatory initial sonogram.    GESTATION: SINGLETON     FETAL ACTIVITY:          Heart rate: 64bpm          The fetus is current.  GESTATIONAL AGE AND  BIOMETRICS:  Gestational criteria: Estimated Date of Delivery: 09/07/17 by early ultrasound now at 4069w0d   CROWN RUMP LENGTH           0.539cm         6.2 weeks                                                                               AVERAGE EGA(BY THIS SCAN):  6.2 weeks  WORKING EDD:  09/07/17     TECHNICIAN COMMENTS:  [facility]. Dating of 6.2wks today is not consistent with last US.    A copy of this report including all images has been saved and backed up to a second source for retrieval if needed. All measures and details of the anatomical scan, placentation, fluid volume and pelvic anatomy are contained in that report.  Amber Nielsen 01/19/2017 8:39 AM

## 2017-01-20 ENCOUNTER — Ambulatory Visit (HOSPITAL_COMMUNITY): Payer: Self-pay

## 2017-01-20 LAB — OBSTETRIC PANEL, INCLUDING HIV
Antibody Screen: NEGATIVE
Basophils Absolute: 0 10*3/uL (ref 0.0–0.2)
Basos: 0 %
EOS (ABSOLUTE): 0.1 10*3/uL (ref 0.0–0.4)
Eos: 1 %
HIV Screen 4th Generation wRfx: NONREACTIVE
Hematocrit: 42.3 % (ref 34.0–46.6)
Hemoglobin: 13.6 g/dL (ref 11.1–15.9)
Hepatitis B Surface Ag: NEGATIVE
Immature Grans (Abs): 0 10*3/uL (ref 0.0–0.1)
Immature Granulocytes: 0 %
Lymphocytes Absolute: 2.1 10*3/uL (ref 0.7–3.1)
Lymphs: 19 %
MCH: 30 pg (ref 26.6–33.0)
MCHC: 32.2 g/dL (ref 31.5–35.7)
MCV: 93 fL (ref 79–97)
Monocytes Absolute: 0.5 10*3/uL (ref 0.1–0.9)
Monocytes: 4 %
Neutrophils Absolute: 8.5 10*3/uL — ABNORMAL HIGH (ref 1.4–7.0)
Neutrophils: 76 %
Platelets: 347 10*3/uL (ref 150–379)
RBC: 4.53 x10E6/uL (ref 3.77–5.28)
RDW: 14.2 % (ref 12.3–15.4)
RPR Ser Ql: NONREACTIVE
Rh Factor: POSITIVE
Rubella Antibodies, IGG: 2.46 index (ref >0.99)
WBC: 11.1 10*3/uL — ABNORMAL HIGH (ref 3.4–10.8)

## 2017-01-20 LAB — HEMOGLOBINOPATHY EVALUATION
HGB C: 0 %
HGB S: 0 %
HGB VARIANT: 0 %
Hemoglobin A2 Quantitation: 2.4 % (ref 1.8–3.2)
Hemoglobin F Quantitation: 0 % (ref 0.0–2.0)
Hgb A: 97.6 % (ref 96.4–98.8)

## 2017-01-20 LAB — SMN1 COPY NUMBER ANALYSIS (SMA CARRIER SCREENING)

## 2017-01-20 LAB — CMP AND LIVER
ALT: 42 IU/L — ABNORMAL HIGH (ref 0–32)
AST: 33 IU/L (ref 0–40)
Albumin: 3.9 g/dL (ref 3.5–5.5)
Alkaline Phosphatase: 70 IU/L (ref 39–117)
BUN: 10 mg/dL (ref 6–20)
Bilirubin Total: 0.2 mg/dL (ref 0.0–1.2)
Bilirubin, Direct: 0.08 mg/dL (ref 0.00–0.40)
CO2: 23 mmol/L (ref 20–29)
Calcium: 9.4 mg/dL (ref 8.7–10.2)
Chloride: 98 mmol/L (ref 96–106)
Creatinine, Ser: 0.56 mg/dL — ABNORMAL LOW (ref 0.57–1.00)
GFR calc Af Amer: 146 mL/min/{1.73_m2} (ref >59)
GFR calc non Af Amer: 126 mL/min/{1.73_m2} (ref >59)
Glucose: 131 mg/dL — ABNORMAL HIGH (ref 65–99)
Potassium: 4.1 mmol/L (ref 3.5–5.2)
Sodium: 138 mmol/L (ref 134–144)
Total Protein: 7 g/dL (ref 6.0–8.5)

## 2017-01-20 LAB — CYSTIC FIBROSIS MUTATION 97: Interpretation: NOT DETECTED

## 2017-01-20 LAB — PROGESTERONE: Progesterone: 3.1 ng/mL

## 2017-01-20 LAB — HEMOGLOBIN A1C
Est. average glucose Bld gHb Est-mCnc: 134 mg/dL
Hgb A1c MFr Bld: 6.3 % — ABNORMAL HIGH (ref 4.8–5.6)

## 2017-01-25 ENCOUNTER — Encounter (INDEPENDENT_AMBULATORY_CARE_PROVIDER_SITE_OTHER): Payer: Self-pay | Admitting: *Deleted

## 2017-01-25 ENCOUNTER — Encounter: Payer: Self-pay | Admitting: Radiology

## 2017-01-26 ENCOUNTER — Telehealth (HOSPITAL_COMMUNITY): Payer: Self-pay

## 2017-01-26 ENCOUNTER — Inpatient Hospital Stay (HOSPITAL_COMMUNITY): Payer: Medicaid Other

## 2017-01-26 ENCOUNTER — Other Ambulatory Visit: Payer: Self-pay | Admitting: Obstetrics and Gynecology

## 2017-01-26 ENCOUNTER — Inpatient Hospital Stay (HOSPITAL_COMMUNITY)
Admission: AD | Admit: 2017-01-26 | Discharge: 2017-01-26 | Disposition: A | Discharge status: Home / Self Care | Payer: Medicaid Other | Source: Ambulatory Visit | Attending: Obstetrics and Gynecology | Admitting: Obstetrics and Gynecology

## 2017-01-26 ENCOUNTER — Other Ambulatory Visit: Payer: Self-pay

## 2017-01-26 ENCOUNTER — Encounter: Payer: Self-pay | Admitting: Obstetrics and Gynecology

## 2017-01-26 ENCOUNTER — Encounter (HOSPITAL_COMMUNITY): Payer: Self-pay | Admitting: Obstetrics and Gynecology

## 2017-01-26 DIAGNOSIS — O4691 Antepartum hemorrhage, unspecified, first trimester: Secondary | ICD-10-CM | POA: Diagnosis present

## 2017-01-26 DIAGNOSIS — Z87891 Personal history of nicotine dependence: Secondary | ICD-10-CM | POA: Diagnosis not present

## 2017-01-26 DIAGNOSIS — I10 Essential (primary) hypertension: Secondary | ICD-10-CM | POA: Insufficient documentation

## 2017-01-26 DIAGNOSIS — Z8632 Personal history of gestational diabetes: Secondary | ICD-10-CM

## 2017-01-26 DIAGNOSIS — F419 Anxiety disorder, unspecified: Secondary | ICD-10-CM | POA: Diagnosis not present

## 2017-01-26 DIAGNOSIS — O039 Complete or unspecified spontaneous abortion without complication: Secondary | ICD-10-CM | POA: Diagnosis present

## 2017-01-26 DIAGNOSIS — K589 Irritable bowel syndrome without diarrhea: Secondary | ICD-10-CM | POA: Diagnosis not present

## 2017-01-26 DIAGNOSIS — Z79899 Other long term (current) drug therapy: Secondary | ICD-10-CM | POA: Diagnosis not present

## 2017-01-26 DIAGNOSIS — F319 Bipolar disorder, unspecified: Secondary | ICD-10-CM | POA: Diagnosis not present

## 2017-01-26 DIAGNOSIS — O26891 Other specified pregnancy related conditions, first trimester: Secondary | ICD-10-CM | POA: Diagnosis present

## 2017-01-26 DIAGNOSIS — R109 Unspecified abdominal pain: Secondary | ICD-10-CM | POA: Diagnosis present

## 2017-01-26 DIAGNOSIS — O209 Hemorrhage in early pregnancy, unspecified: Secondary | ICD-10-CM

## 2017-01-26 DIAGNOSIS — Z3480 Encounter for supervision of other normal pregnancy, unspecified trimester: Secondary | ICD-10-CM

## 2017-01-26 DIAGNOSIS — O208 Other hemorrhage in early pregnancy: Secondary | ICD-10-CM

## 2017-01-26 DIAGNOSIS — O200 Threatened abortion: Secondary | ICD-10-CM | POA: Insufficient documentation

## 2017-01-26 DIAGNOSIS — Z8759 Personal history of other complications of pregnancy, childbirth and the puerperium: Secondary | ICD-10-CM

## 2017-01-26 HISTORY — DX: Urinary tract infection, site not specified: N39.0

## 2017-01-26 HISTORY — DX: Personal history of other complications of pregnancy, childbirth and the puerperium: Z87.59

## 2017-01-26 HISTORY — DX: Personal history of gestational diabetes: Z86.32

## 2017-01-26 HISTORY — DX: Hydronephrosis with renal and ureteral calculous obstruction: N13.2

## 2017-01-26 HISTORY — DX: Sepsis, unspecified organism: A41.9

## 2017-01-26 HISTORY — DX: Acute pyelonephritis: N10

## 2017-01-26 LAB — URINALYSIS, ROUTINE W REFLEX MICROSCOPIC
Bilirubin Urine: NEGATIVE
Glucose, UA: NEGATIVE mg/dL
Hgb urine dipstick: NEGATIVE
Ketones, ur: NEGATIVE mg/dL
Leukocytes, UA: NEGATIVE
Nitrite: NEGATIVE
Protein, ur: NEGATIVE mg/dL
Specific Gravity, Urine: 1.016 (ref 1.005–1.030)
pH: 5 (ref 5.0–8.0)

## 2017-01-26 LAB — TYPE AND SCREEN
ABO/RH(D): O POS
Antibody Screen: NEGATIVE

## 2017-01-26 LAB — CBC
HCT: 42.1 % (ref 36.0–46.0)
Hemoglobin: 13.7 g/dL (ref 12.0–15.0)
MCH: 30.2 pg (ref 26.0–34.0)
MCHC: 32.5 g/dL (ref 30.0–36.0)
MCV: 92.7 fL (ref 78.0–100.0)
Platelets: 372 10*3/uL (ref 150–400)
RBC: 4.54 MIL/uL (ref 3.87–5.11)
RDW: 13.2 % (ref 11.5–15.5)
WBC: 11.5 10*3/uL — ABNORMAL HIGH (ref 4.0–10.5)

## 2017-01-26 LAB — WET PREP, GENITAL
Clue Cells Wet Prep HPF POC: NONE SEEN
Sperm: NONE SEEN
Trich, Wet Prep: NONE SEEN
WBC, Wet Prep HPF POC: NONE SEEN
Yeast Wet Prep HPF POC: NONE SEEN

## 2017-01-26 NOTE — MAU Note (Signed)
Pt states she began having back pain and nausea yesterday. This am she began to have some bright red bleeding and lower abd cramping.

## 2017-01-26 NOTE — MAU Note (Signed)
[facility] and provide emotional support, pt. decline, "Grieving & Healing", Heartstrings  Information. [facility]

## 2017-01-26 NOTE — Telephone Encounter (Signed)
Called and spoke w/ Amber Nielsen, advised her that her surgery is scheduled for tomorrow 01/27/2017 @11 :00, [facility], nothing to eat or drink after midnight, no lotion, no perfume, no powder, a little bit of deodorant is okay, advised her to remove all jewelry before arriving. Patient expressed understanding. Advised her that should receive a call from Pre-Op in a little bit. Advised her to call back if she had any questions

## 2017-01-26 NOTE — Discharge Instructions (Signed)
Dilation and Curettage or Vacuum Curettage, Care After  These instructions give you information about caring for yourself after your procedure. Your doctor may also give you more specific instructions. Call your doctor if you have any problems or questions after your procedure.  Follow these instructions at home:  Activity   · Do not drive or use heavy machinery while taking prescription pain medicine.  · For 24 hours after your procedure, avoid driving.  · Take short walks often, followed by rest periods. Ask your doctor what activities are safe for you. After one or two days, you may be able to return to your normal activities.  · Do not lift anything that is heavier than 10 lb (4.5 kg) until your doctor approves.  · For at least 2 weeks, or as long as told by your doctor:  ? Do not douche.  ? Do not use tampons.  ? Do not have sex.  General instructions   · Take over-the-counter and prescription medicines only as told by your doctor. This is very important if you take blood thinning medicine.  · Do not take baths, swim, or use a hot tub until your doctor approves. Take showers instead of baths.  · Wear compression stockings as told by your doctor.  · It is up to you to get the results of your procedure. Ask your doctor when your results will be ready.  · Keep all follow-up visits as told by your doctor. This is important.  Contact a doctor if:  · You have very bad cramps that get worse or do not get better with medicine.  · You have very bad pain in your belly (abdomen).  · You cannot drink fluids without throwing up (vomiting).  · You get pain in a different part of the area between your belly and thighs (pelvis).  · You have bad-smelling discharge from your vagina.  · You have a rash.  Get help right away if:  · You are bleeding a lot from your vagina. A lot of bleeding means soaking more than one sanitary pad in an hour, for 2 hours in a row.  · You have clumps of blood (blood clots) coming from your  vagina.  · You have a fever or chills.  · Your belly feels very tender or hard.  · You have chest pain.  · You have trouble breathing.  · You cough up blood.  · You feel dizzy.  · You feel light-headed.  · You pass out (faint).  · You have pain in your neck or shoulder area.  Summary  · Take short walks often, followed by rest periods. Ask your doctor what activities are safe for you. After one or two days, you may be able to return to your normal activities.  · Do not lift anything that is heavier than 10 lb (4.5 kg) until your doctor approves.  · Do not take baths, swim, or use a hot tub until your doctor approves. Take showers instead of baths.  · Contact your doctor if you have any symptoms of infection, like bad-smelling discharge from your vagina.  This information is not intended to replace advice given to you by your health care provider. Make sure you discuss any questions you have with your health care provider.  Document Released: 12/03/2007 Document Revised: 11/11/2015 Document Reviewed: 11/11/2015  Elsevier Interactive Patient Education © 2017 Elsevier Inc.

## 2017-01-26 NOTE — MAU Provider Note (Signed)
Patient Amber Nielsen is a 31 y.o. [id number] At 9456w0d  Here with complaints of bleeding and cramping. Her last US on November 12 showed SIUP with cardiac activity at 3077 and gestation sac of 5 weeks and 6 days. She started having some cramping yesterday, and then had some pink spotting this mornign when she wiped.   She denies any other ob-gyn complaint.  History     CSN: [id number]  Arrival date and time: 01/26/17 [id number]   First Provider Initiated Contact with Patient 01/26/17 1052      Chief Complaint  Patient presents with  . Vaginal Bleeding  . Abdominal Pain   Vaginal Bleeding  The patient's primary symptoms include vaginal bleeding. The patient's pertinent negatives include no genital odor or vaginal discharge. This is a new problem. The current episode started today. The problem occurs 2 to 4 times per day. The patient is experiencing no pain. Associated symptoms include abdominal pain. The vaginal discharge was bloody. The vaginal bleeding is spotting. Nothing aggravates the symptoms. She has tried nothing for the symptoms.   Patient had some pink spotting when she wiped, and then nothing else. Her abdominal pain feels kind of "crampy" but she rates them a 1/10.  OB History    Gravida Para Term Preterm AB Living   4 2 2  0 1 2   SAB TAB Ectopic Multiple Live Births   1 0 0 0 2      Past Medical History:  Diagnosis Date  . Anxiety   . Asthma    allergy related. rodent animals  . Bipolar 1 disorder (HCC)   . Depression   . Gestational diabetes    diet controlled  . H/O varicella   . Headache(784.0)   . HTN (hypertension)   . IBS (irritable bowel syndrome)   . Kidney stones   . Obesity   . Ovarian cyst   . Pregnancy induced hypertension   . Sepsis [facility](HCC)     Past Surgical History:  Procedure Laterality Date  . COLONOSCOPY    . CYSTOSCOPY WITH RETROGRADE PYELOGRAM/URETERAL STENT PLACEMENT Left 08/26/2016   [name] Stephen[facility]  . CYSTOSCOPY  WITH URETEROSCOPY , STONE EXTRACTION AND STENT REMOVAL Left 09/14/2016   [name]  . ibs    . TOOTH EXTRACTION      Family History  Problem Relation Age of Onset  . Heart disease Father   . Heart attack Father   . Nephrolithiasis Father   . Colon polyps Father   . Mental retardation Maternal Aunt   . Diabetes Maternal Grandmother   . Cancer Maternal Grandmother        ovarian and breast  . Diabetes Maternal Grandfather   . Diabetes Paternal Grandmother   . Hypertension Paternal Grandmother   . Heart disease Paternal Grandmother   . Colon polyps Paternal Grandmother   . Diabetes Paternal Grandfather   . Colon polyps Paternal Uncle     Social History   Tobacco Use  . Smoking status: Former Smoker    Packs/day: 1.00    Types: Cigarettes    Last attempt to quit: 12/21/2016    Years since quitting: 0.0  . Smokeless tobacco: Never Used  Substance Use Topics  . Alcohol use: No    Alcohol/week: 0.0 oz  . Drug use: No    Comment: molly/THC    Allergies:  Allergies  Allergen Reactions  . Celery Oil Swelling  Tongue swelling when eating celery  . Cymbalta [Duloxetine Hcl] Other (See Comments)    "out of body"  . Lamictal [Lamotrigine] Other (See Comments)    unknown  . Prozac [Fluoxetine Hcl] Other (See Comments)    Anger problems    Medications Prior to Admission  Medication Sig Dispense Refill Last Dose  . albuterol (PROVENTIL HFA;VENTOLIN HFA) 108 (90 Base) MCG/ACT inhaler Inhale 2 puffs into the lungs every 6 (six) hours as needed for wheezing or shortness of breath.   months at Unknown time  . citalopram (CELEXA) 20 MG tablet Take 1 tablet (20 mg total) daily by mouth. 30 tablet 2 01/25/2017 at Unknown time  . Prenatal Vit-Fe Fumarate-FA (PRENATAL MULTIVITAMIN) TABS tablet Take 1 tablet by mouth daily at 12 noon.   01/25/2017 at Unknown time  . progesterone (PROMETRIUM) 100 MG capsule Take 1 capsule (100 mg total) daily by  mouth. 31 capsule 0 01/25/2017 at Unknown time    Review of Systems  HENT: Negative.   Respiratory: Negative.   Cardiovascular: Negative.   Gastrointestinal: Positive for abdominal pain.  Genitourinary: Positive for vaginal bleeding. Negative for vaginal discharge.  Musculoskeletal: Negative.   Neurological: Negative.    Physical Exam   Blood pressure 133/78, pulse 70, temperature 98.3 F (36.8 C), temperature source Oral, resp. rate 17, height 5\' 10"  (1.778 m), weight 298 lb (135.2 kg), last menstrual period 11/23/2016, SpO2 99 %.  Physical Exam  Constitutional: She is oriented to person, place, and time. She appears well-developed and well-nourished.  HENT:  Head: Normocephalic.  Neck: Normal range of motion.  GI: Soft.  Genitourinary:  Genitourinary Comments: NEFG; small trace blood in the vagina; no other discharge. Cervix is FT, no CMT, suprapubic or adnexal tenderness.   Musculoskeletal: Normal range of motion.  Neurological: She is alert and oriented to person, place, and time.  Skin: Skin is warm and dry.    MAU Course  Procedures  MDM -US shows IUP with no cardiac activity at 6 weeks and 5 days.  -CMP, CBC, blood type done   -After discussing with her husband, patient desires D&C. Spoke with Dr. Vergie LivingPickens, who reviewed chart and was able to schedule patient for D and C on 01-27-2017.   Assessment and Plan   1. Miscarriage 2. Plan for D and C tomorrow 3. Reviewed bleeding precautions and when to return MAU.  4. Patient to stay NPO after midnight; all questions answered.    Amber Nielsen CNM 01/26/2017, 11:45 AM

## 2017-01-27 ENCOUNTER — Encounter (HOSPITAL_COMMUNITY): Payer: Self-pay | Admitting: Anesthesiology

## 2017-01-27 ENCOUNTER — Encounter (HOSPITAL_COMMUNITY)
Admission: AD | Disposition: A | Discharge status: Home / Self Care | Payer: Self-pay | Source: Ambulatory Visit | Attending: Family Medicine

## 2017-01-27 ENCOUNTER — Ambulatory Visit (HOSPITAL_COMMUNITY): Payer: Medicaid Other

## 2017-01-27 ENCOUNTER — Ambulatory Visit (HOSPITAL_COMMUNITY)
Admission: AD | Admit: 2017-01-27 | Discharge: 2017-01-27 | Disposition: A | Discharge status: Home / Self Care | Payer: Medicaid Other | Source: Ambulatory Visit | Attending: Family Medicine | Admitting: Family Medicine

## 2017-01-27 DIAGNOSIS — Z3A08 8 weeks gestation of pregnancy: Secondary | ICD-10-CM | POA: Insufficient documentation

## 2017-01-27 DIAGNOSIS — O039 Complete or unspecified spontaneous abortion without complication: Secondary | ICD-10-CM | POA: Diagnosis not present

## 2017-01-27 DIAGNOSIS — Z87442 Personal history of urinary calculi: Secondary | ICD-10-CM | POA: Diagnosis not present

## 2017-01-27 DIAGNOSIS — N939 Abnormal uterine and vaginal bleeding, unspecified: Secondary | ICD-10-CM

## 2017-01-27 DIAGNOSIS — F319 Bipolar disorder, unspecified: Secondary | ICD-10-CM | POA: Insufficient documentation

## 2017-01-27 LAB — GC/CHLAMYDIA PROBE AMP (~~LOC~~) NOT AT ARMC
Chlamydia: NEGATIVE
Neisseria Gonorrhea: NEGATIVE

## 2017-01-27 SURGERY — DILATION AND EVACUATION, UTERUS
Anesthesia: Choice

## 2017-01-27 MED ORDER — DOXYCYCLINE HYCLATE 100 MG IV SOLR
100.0000 mg | Freq: Once | INTRAVENOUS | Status: DC
  Filled 2017-01-27: qty 100

## 2017-01-27 MED ORDER — LACTATED RINGERS IV SOLN: INTRAVENOUS | Status: DC

## 2017-01-27 NOTE — Progress Notes (Signed)
Patient was scheduled for DE today but reported increased bleeding/clots overnight and filled 1 pad per hour. She was sent for an US which confirmed passage of pregnancy.   [place] Ob Transvaginal  Result Date: 01/27/2017 CLINICAL DATA:  Vaginal bleeding EXAM: TRANSVAGINAL OB ULTRASOUND TECHNIQUE: Transvaginal ultrasound was performed for complete evaluation of the gestation as well as the maternal uterus, adnexal regions, and pelvic cul-de-sac. COMPARISON:  01/26/2017 FINDINGS: Intrauterine gestational sac: None visualized Yolk sac:  Not visualized Embryo:  Not visualized Cardiac Activity: Heart Rate:  bpm MSD:   mm    w     d CRL:     mm    w  d                  US EDC: Subchorionic hemorrhage:  N/A Maternal uterus/adnexae: Swirling contents noted within the endometrium, likely blood products. Endometrium thickened at 17 mm. IMPRESSION: Previously seen intrauterine gestation no longer visualized. Mildly thickened endometrium with swirling products in the endometrial canal, likely blood products. Findings compatible with spontaneous abortion. Electronically Signed   By: Charlett NoseKevin  Dover M.D.   On: 01/27/2017 10:25   [place] Ob Transvaginal  Result Date: 01/26/2017 CLINICAL DATA:  Pregnant, lower abdominal cramping, bleeding EXAM: TRANSVAGINAL OB ULTRASOUND TECHNIQUE: Transvaginal ultrasound was performed for complete evaluation of the gestation as well as the maternal uterus, adnexal regions, and pelvic cul-de-sac. COMPARISON:  None. FINDINGS: Intrauterine gestational sac: Single Yolk sac:  Visualized. Embryo:  Visualized. Cardiac Activity: Not Visualized. CRL:   4.7  mm   6 w 1 d Subchorionic hemorrhage:  None visualized. Maternal uterus/adnexae: Bilateral ovaries are not discretely visualized. Trace pelvic fluid. IMPRESSION: Single intrauterine gestation, without cardiac activity, as described above. When correlating with the recent prior study, findings meet definitive criteria for failed pregnancy. This follows SRU  consensus guidelines: Diagnostic Criteria for Nonviable Pregnancy Early in the First Trimester. Macy Mis Engl J Med [phone]. These results will be called to the ordering clinician or representative by the Radiology Department at the imaging location. Electronically Signed   By: Charline BillsSriyesh  Krishnan M.D.   On: 01/26/2017 12:15    Results for orders placed or performed during the hospital encounter of 01/26/17 (from the past 24 hour(s))  CBC     Status: Abnormal   Collection Time: 01/26/17  1:11 PM  Result Value Ref Range   WBC 11.5 (H) 4.0 - 10.5 K/uL   RBC 4.54 3.87 - 5.11 MIL/uL   Hemoglobin 13.7 12.0 - 15.0 g/dL   HCT 08.642.1 57.836.0 - 46.946.0 %   MCV 92.7 78.0 - 100.0 fL   MCH 30.2 26.0 - 34.0 pg   MCHC 32.5 30.0 - 36.0 g/dL   RDW 62.913.2 52.811.5 - 41.315.5 %   Platelets 372 150 - 400 K/uL  Type and screen [facility]     Status: None   Collection Time: 01/26/17  1:11 PM  Result Value Ref Range   ABO/RH(D) O POS    Antibody Screen NEG    Sample Expiration 01/29/2017    Assessment and plan #Complete abortion She [facility]   Federico FlakeKimberly Niles Tyrianna Lightle, MD, MPH, ABFM Attending Physician Faculty Practice- [facility]

## 2017-02-02 ENCOUNTER — Ambulatory Visit (HOSPITAL_COMMUNITY): Payer: Self-pay

## 2017-02-09 ENCOUNTER — Encounter: Payer: Self-pay | Admitting: Obstetrics & Gynecology

## 2017-02-17 ENCOUNTER — Ambulatory Visit (INDEPENDENT_AMBULATORY_CARE_PROVIDER_SITE_OTHER): Payer: Self-pay | Admitting: Family Medicine

## 2017-02-17 ENCOUNTER — Encounter: Payer: Self-pay | Admitting: Family Medicine

## 2017-02-17 VITALS — BP 122/68 | HR 86 | Wt 295.4 lb

## 2017-02-17 DIAGNOSIS — F332 Major depressive disorder, recurrent severe without psychotic features: Secondary | ICD-10-CM

## 2017-02-17 DIAGNOSIS — O039 Complete or unspecified spontaneous abortion without complication: Secondary | ICD-10-CM

## 2017-02-17 DIAGNOSIS — Z720 Tobacco use: Secondary | ICD-10-CM | POA: Insufficient documentation

## 2017-02-17 NOTE — Progress Notes (Signed)
   Miscarriage follow up VISIT NOTE  Subjective:  Amber Nielsen is a 31 y.o. [id number] s/p miscarriage on 01/27/2017. Resumed sexual activity, Unprotected sex within the last week.  The following portions of the patient's history were reviewed and updated as appropriate: allergies, current medications, past family history, past medical history, past social history, past surgical history and problem list. Problem list updated.  Objective:   Vitals:   02/17/17 0902  BP: 122/68  Pulse: 86  Weight: 295 lb 6.4 oz (134 kg)   Body mass index is 42.39 kg/m.   Fetal Status:           General:  Alert, oriented and cooperative. Patient is in no acute distress.  Skin: Skin is warm and dry. No rash noted.   Cardiovascular: Normal heart rate noted  Respiratory: Normal respiratory effort, no problems with respiration noted  Abdomen: Soft, gravid, appropriate for gestational age.        Pelvic: deferred  Extremities: Normal range of motion.     Mental Status:  Normal mood and affect. Normal behavior. Normal judgment and thought content.   Assessment and Plan:  Pregnancy: [id number] - 3 weeks s/p miscarriage  #Miscarriage - provided support for client and acute grief rxn. She reports depressed mood but denies SI, HI - Reviewed resumed fertility after miscarriage - Recommended stopping tobacco use - Recommended taking PNV -Discussed weight loss to improve fertility  >50% of this 25 minute appointment was spent in direct counseling regarding miscarriage, work up for recurrent miscarriages, fertility and preconception counseling.   Please refer to After Visit Summary for other counseling recommendations.  Return for positive pregnancy test or in 1 year.   Federico FlakeKimberly Niles Rohith Fauth, MD

## 2017-03-03 ENCOUNTER — Other Ambulatory Visit (HOSPITAL_COMMUNITY): Payer: Self-pay

## 2017-05-09 ENCOUNTER — Other Ambulatory Visit: Payer: Self-pay

## 2017-05-09 ENCOUNTER — Encounter (HOSPITAL_COMMUNITY): Payer: Self-pay | Admitting: Emergency Medicine

## 2017-05-09 ENCOUNTER — Emergency Department (HOSPITAL_COMMUNITY): Payer: Medicaid Other

## 2017-05-09 ENCOUNTER — Emergency Department (HOSPITAL_COMMUNITY)
Admission: EM | Admit: 2017-05-09 | Discharge: 2017-05-09 | Disposition: A | Discharge status: Home / Self Care | Payer: Medicaid Other | Attending: Emergency Medicine | Admitting: Emergency Medicine

## 2017-05-09 DIAGNOSIS — Z79899 Other long term (current) drug therapy: Secondary | ICD-10-CM | POA: Diagnosis not present

## 2017-05-09 DIAGNOSIS — R110 Nausea: Secondary | ICD-10-CM | POA: Insufficient documentation

## 2017-05-09 DIAGNOSIS — R109 Unspecified abdominal pain: Secondary | ICD-10-CM | POA: Diagnosis not present

## 2017-05-09 DIAGNOSIS — I10 Essential (primary) hypertension: Secondary | ICD-10-CM | POA: Diagnosis not present

## 2017-05-09 DIAGNOSIS — Z87891 Personal history of nicotine dependence: Secondary | ICD-10-CM | POA: Insufficient documentation

## 2017-05-09 DIAGNOSIS — J45909 Unspecified asthma, uncomplicated: Secondary | ICD-10-CM | POA: Diagnosis not present

## 2017-05-09 LAB — COMPREHENSIVE METABOLIC PANEL
ALT: 39 U/L (ref 14–54)
AST: 31 U/L (ref 15–41)
Albumin: 3.5 g/dL (ref 3.5–5.0)
Alkaline Phosphatase: 70 U/L (ref 38–126)
Anion gap: 11 (ref 5–15)
BUN: 8 mg/dL (ref 6–20)
CO2: 24 mmol/L (ref 22–32)
Calcium: 9.1 mg/dL (ref 8.9–10.3)
Chloride: 103 mmol/L (ref 101–111)
Creatinine, Ser: 0.65 mg/dL (ref 0.44–1.00)
GFR calc Af Amer: >60 mL/min (ref >60)
GFR calc non Af Amer: >60 mL/min (ref >60)
Glucose, Bld: 156 mg/dL — ABNORMAL HIGH (ref 65–99)
Potassium: 3.5 mmol/L (ref 3.5–5.1)
Sodium: 138 mmol/L (ref 135–145)
Total Bilirubin: 0.5 mg/dL (ref 0.3–1.2)
Total Protein: 7.4 g/dL (ref 6.5–8.1)

## 2017-05-09 LAB — URINALYSIS, ROUTINE W REFLEX MICROSCOPIC
Bilirubin Urine: NEGATIVE
Glucose, UA: NEGATIVE mg/dL
Hgb urine dipstick: NEGATIVE
Ketones, ur: NEGATIVE mg/dL
Leukocytes, UA: NEGATIVE
Nitrite: NEGATIVE
Protein, ur: NEGATIVE mg/dL
Specific Gravity, Urine: 1.029 (ref 1.005–1.030)
pH: 5 (ref 5.0–8.0)

## 2017-05-09 LAB — CBC
HCT: 42.2 % (ref 36.0–46.0)
Hemoglobin: 13.8 g/dL (ref 12.0–15.0)
MCH: 30.5 pg (ref 26.0–34.0)
MCHC: 32.7 g/dL (ref 30.0–36.0)
MCV: 93.2 fL (ref 78.0–100.0)
Platelets: 340 10*3/uL (ref 150–400)
RBC: 4.53 MIL/uL (ref 3.87–5.11)
RDW: 13.4 % (ref 11.5–15.5)
WBC: 12.9 10*3/uL — ABNORMAL HIGH (ref 4.0–10.5)

## 2017-05-09 LAB — LIPASE, BLOOD: Lipase: 32 U/L (ref 11–51)

## 2017-05-09 LAB — I-STAT BETA HCG BLOOD, ED (MC, WL, AP ONLY): I-stat hCG, quantitative: <5 m[IU]/mL (ref <5)

## 2017-05-09 MED ORDER — IBUPROFEN 600 MG PO TABS: 600.0000 mg | ORAL_TABLET | Freq: Four times a day (QID) | ORAL | 0 refills | Status: DC | PRN

## 2017-05-09 MED ORDER — CYCLOBENZAPRINE HCL 10 MG PO TABS: 10.0000 mg | ORAL_TABLET | Freq: Two times a day (BID) | ORAL | 0 refills | Status: DC | PRN

## 2017-05-09 NOTE — ED Notes (Signed)
Pt verbalizes understanding of d/c instructions. Pt received prescriptions. Pt ambulatory at d/c with all belongings.  

## 2017-05-09 NOTE — ED Notes (Signed)
Contacted lab for add on blood labs.

## 2017-05-09 NOTE — Discharge Instructions (Signed)
You have been evaluated for your flank pain. No evidence of infected obstructive kidney stone.  Please follow up with your doctor for further evaluation of your condition.  Return if your symptoms worsen or if you have other concerns.

## 2017-05-09 NOTE — ED Triage Notes (Signed)
Pt c/o left flank pain for the past few days getting worse today, pt states she has prior hx of  Kidney infection and kidney stones. No fever sometimes feeling nauseated.

## 2017-05-09 NOTE — ED Notes (Signed)
ED Provider at bedside. 

## 2017-05-09 NOTE — ED Notes (Signed)
Pt to Ultrasound

## 2017-05-09 NOTE — ED Provider Notes (Signed)
[facility] EMERGENCY DEPARTMENT Provider Note   CSN: [id number] Arrival date & time: 05/09/17  1956     History   Chief Complaint Chief Complaint  Patient presents with  . Flank Pain    HPI Amber Nielsen is a 32 y.o. female.  HPI   32 year old female with history of kidney stones presenting for evaluation of flank pain.  Patient complaining of pain to her left flank ongoing for the past week.  She described pain as a nagging sharp sensation that initially was localized but now, radiates towards her mid back and up towards her left shoulder.  Pain is waxing and waning, nothing seems to make it better or worse.  She has been taking over-the-counter medication Tylenol ibuprofen without adequate improvement.  Endorsed mild nausea without vomiting.   No report of fever, chills, chest pain, shortness of breath, productive cough, dysuria, hematuria, vaginal bleeding or vaginal discharge.  Patient mentioned having similar symptoms like this last year when she was subsequently diagnosed with pyelonephritis with infected obstructed stone and was septic.  She was admitted to the hospital for an extended period of time.  He is concerning for the same.  Patient also mentions she is currently actively trying to get pregnant.  Her last menstrual period was April 05, 2017.  Past Medical History:  Diagnosis Date  . Acute pyelonephritis   . Anxiety   . Asthma    allergy related. rodent animals  . Bipolar 1 disorder (HCC)   . Depression   . H/O varicella   . Headache(784.0)   . History of gestational diabetes mellitus (GDM) 01/12/2017  . History of pre-eclampsia 01/12/2017  . HTN (hypertension)   . IBS (irritable bowel syndrome)   . Obesity   . Pregnancy induced hypertension   . Sepsis secondary to UTI (HCC) 08/26/2016  . Ureteral stone with hydronephrosis 08/27/2016    Patient Active Problem List   Diagnosis Date Noted  . Tobacco abuse 02/17/2017  . Major depressive  disorder, 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  . Cannabis abuse 02/02/2016  . Morbid obesity (HCC) 12/15/2012    Past Surgical History:  Procedure Laterality Date  . COLONOSCOPY    . CYSTOSCOPY W/ URETERAL STENT PLACEMENT Left 08/26/2016   Procedure: CYSTOSCOPY WITH RETROGRADE PYELOGRAM/URETERAL STENT PLACEMENT;  Surgeon: Marcine Matar, MD;  Location: [facility] OR;  Service: Urology;  Laterality: Left;  . CYSTOSCOPY WITH RETROGRADE PYELOGRAM, URETEROSCOPY AND STENT PLACEMENT Left 09/14/2016   Procedure: CYSTOSCOPY WITH URETEROSCOPY , STONE EXTRACTION AND STENT REMOVAL;  Surgeon: Marcine Matar, MD;  Location: [facility] ORS;  Service: Urology;  Laterality: Left;  . TOOTH EXTRACTION      OB History    Gravida Para Term Preterm AB Living   4 2 2  0 2 2   SAB TAB Ectopic Multiple Live Births   2 0 0 0 2       Home Medications    Prior to Admission medications   Medication Sig Start Date End Date Taking? Authorizing Provider  albuterol (PROVENTIL HFA;VENTOLIN HFA) 108 (90 Base) MCG/ACT inhaler Inhale 2 puffs into the lungs every 6 (six) hours as needed for wheezing or shortness of breath.    [provider]  citalopram (CELEXA) 20 MG tablet Take 1 tablet (20 mg total) daily by mouth. Patient taking differently: Take 20 mg by mouth at bedtime.  01/12/17   Allie Bossier, MD  Prenatal Vit-Fe Fumarate-FA (PRENATAL MULTIVITAMIN) TABS tablet Take 1 tablet by mouth daily  at 12 noon.    [provider]    Family History Family History  Problem Relation Age of Onset  . Heart disease Father   . Heart attack Father   . Nephrolithiasis Father   . Colon polyps Father   . Mental retardation Maternal Aunt   . Diabetes Maternal Grandmother   . Cancer Maternal Grandmother        ovarian and breast  . Diabetes Maternal Grandfather   . Diabetes Paternal Grandmother   . Hypertension Paternal Grandmother   . Heart disease Paternal Grandmother   . Colon polyps  Paternal Grandmother   . Diabetes Paternal Grandfather   . Colon polyps Paternal Uncle     Social History Social History   Tobacco Use  . Smoking status: Former Smoker    Packs/day: 1.00    Types: Cigarettes    Last attempt to quit: 12/21/2016    Years since quitting: 0.3  . Smokeless tobacco: Never Used  Substance Use Topics  . Alcohol use: No    Alcohol/week: 0.0 oz  . Drug use: No    Comment: molly/THC     Allergies   Celery oil; Cymbalta [duloxetine hcl]; Lamictal [lamotrigine]; and Prozac [fluoxetine hcl]   Review of Systems Review of Systems  All other systems reviewed and are negative.    Physical Exam Updated Vital Signs BP 134/90 (BP Location: Right Arm)   Pulse 89   Temp (!) 97.4 F (36.3 C) (Oral)   Resp 18   Ht 5\' 10"  (1.778 m)   Wt 132 kg (291 lb)   LMP 04/05/2017   SpO2 99%   BMI 41.75 kg/m   Physical Exam  Constitutional: She appears well-developed and well-nourished. No distress.  Obese female resting in bed in no acute discomfort.  HENT:  Head: Atraumatic.  Eyes: Conjunctivae are normal.  Neck: Neck supple.  Cardiovascular: Normal rate and regular rhythm.  Pulmonary/Chest: Effort normal and breath sounds normal.  Abdominal: Soft. There is tenderness (Tenderness to left upper abdomen on palpation).  Neurological: She is alert.  Skin: No rash noted.  Psychiatric: She has a normal mood and affect.  Nursing note and vitals reviewed.    ED Treatments / Results  Labs (all labs ordered are listed, but only abnormal results are displayed) Labs Reviewed  URINALYSIS, ROUTINE W REFLEX MICROSCOPIC - Abnormal; Notable for the following components:      Result Value   APPearance HAZY (*)    All other components within normal limits  CBC - Abnormal; Notable for the following components:   WBC 12.9 (*)    All other components within normal limits  COMPREHENSIVE METABOLIC PANEL - Abnormal; Notable for the following components:   Glucose, Bld  156 (*)    All other components within normal limits  LIPASE, BLOOD  I-STAT BETA HCG BLOOD, ED ([facility], [facility], [facility] ONLY)    EKG  EKG Interpretation None       Radiology No results found.  Procedures Procedures (including critical care time)  Medications Ordered in ED Medications - No data to display   Initial Impression / Assessment and Plan / ED Course  I have reviewed the triage vital signs and the nursing notes.  Pertinent labs & imaging results that were available during my care of the patient were reviewed by me and considered in my medical decision making (see chart for details).     BP 135/83   Pulse 94   Temp (!) 97.4 F (36.3 C) (  Oral)   Resp 16   Ht 5\' 10"  (1.778 m)   Wt 132 kg (291 lb)   LMP 04/05/2017   SpO2 99%   BMI 41.75 kg/m    Final Clinical Impressions(s) / ED Diagnoses   Final diagnoses:  Left flank pain    ED Discharge Orders        Ordered    ibuprofen (ADVIL,MOTRIN) 600 MG tablet  Every 6 hours PRN     05/09/17 2155    cyclobenzaprine (FLEXERIL) 10 MG tablet  2 times daily PRN     05/09/17 2155     Patient here with complaints of left flank pain.  History of infected kidney stones in the past.  She is concerned that another kidney stone.  She is currently well-appearing in no acute discomfort, vital signs stable.  Pregnancy test is negative, urine shows no signs of urinary tract infection.  She does have an elevated WBC of 12.9 but this is nonspecific.  Her CMP and lipase is currently pending.  A renal ultrasound study was ordered.  If the result is negative and patient does not have significant discomfort on reexamination, she understand that she can be discharged to follow-up with her primary care provider for further care.  If she still have pain, will consider abdominal pelvic CT scan for further evaluation.  Care discussed with Kerrie BuffaloHope Neese, NP who will f/u on renal US result and will reexamine to determine disposition.    Fayrene Helperran, Wilver Tignor,  PA-C 05/09/17 2213    Rolland PorterJames, Mark, MD 05/10/17 1134

## 2017-05-09 NOTE — ED Provider Notes (Signed)
THIS IS A SHARED VISIT WITH B. TRAN, PAC. PATIENT AWAITING ULTRASOUND. BP 129/69   Pulse (!) 101   Temp (!) 97.4 F (36.3 C) (Oral)   Resp 16   Ht 5\' 10"  (1.778 m)   Wt 132 kg (291 lb)   LMP 04/05/2017   SpO2 97%   BMI 41.75 kg/m   [place] Renal  Result Date: 05/09/2017 CLINICAL DATA:  Flank pain.  Left nephroureteral stent. EXAM: RENAL / URINARY TRACT ULTRASOUND COMPLETE COMPARISON:  None. FINDINGS: Right Kidney: Length: 10.7 cm. Echogenicity within normal limits. No mass or hydronephrosis visualized. Left Kidney: Length: 11.4 cm. Echogenicity within normal limits. No mass or hydronephrosis visualized. Bladder: Appears normal for degree of bladder distention. Both ureteral jets are visualized. IMPRESSION: 1. Normal renal ultrasound. 2. No nephroureteral stent is visualized. Electronically Signed   By: Deatra RobinsonKevin  Herman M.D.   On: 05/09/2017 22:59    Discussed with the patient results of ultrasound and plan of care. All questions answered. Patient agrees with plan and will f/u with her PCP. She will return here as needed.    Kerrie Buffaloeese, Hope NorwoodM, [place]NP 05/09/17 2324    Cathren LaineSteinl, Kevin, MD 05/09/17 2352

## 2017-09-09 ENCOUNTER — Emergency Department: Payer: Medicaid Other

## 2017-09-09 ENCOUNTER — Other Ambulatory Visit: Payer: Self-pay

## 2017-09-09 ENCOUNTER — Encounter: Payer: Self-pay | Admitting: Emergency Medicine

## 2017-09-09 ENCOUNTER — Emergency Department
Admission: EM | Admit: 2017-09-09 | Discharge: 2017-09-09 | Disposition: A | Discharge status: Home / Self Care | Payer: Medicaid Other | Attending: Student in an Organized Health Care Education/Training Program | Admitting: Student in an Organized Health Care Education/Training Program

## 2017-09-09 DIAGNOSIS — I10 Essential (primary) hypertension: Secondary | ICD-10-CM | POA: Insufficient documentation

## 2017-09-09 DIAGNOSIS — J45909 Unspecified asthma, uncomplicated: Secondary | ICD-10-CM | POA: Diagnosis not present

## 2017-09-09 DIAGNOSIS — R109 Unspecified abdominal pain: Secondary | ICD-10-CM | POA: Diagnosis present

## 2017-09-09 DIAGNOSIS — Z87891 Personal history of nicotine dependence: Secondary | ICD-10-CM | POA: Insufficient documentation

## 2017-09-09 DIAGNOSIS — Z79899 Other long term (current) drug therapy: Secondary | ICD-10-CM | POA: Diagnosis not present

## 2017-09-09 DIAGNOSIS — R1032 Left lower quadrant pain: Secondary | ICD-10-CM | POA: Diagnosis not present

## 2017-09-09 LAB — WET PREP, GENITAL
Clue Cells Wet Prep HPF POC: NONE SEEN
Sperm: NONE SEEN
Trich, Wet Prep: NONE SEEN
Yeast Wet Prep HPF POC: NONE SEEN

## 2017-09-09 LAB — URINALYSIS, COMPLETE (UACMP) WITH MICROSCOPIC
Bacteria, UA: NONE SEEN
Bilirubin Urine: NEGATIVE
Glucose, UA: 50 mg/dL — AB
Hgb urine dipstick: NEGATIVE
Ketones, ur: NEGATIVE mg/dL
Leukocytes, UA: NEGATIVE
Nitrite: NEGATIVE
Protein, ur: NEGATIVE mg/dL
Specific Gravity, Urine: 1.03 (ref 1.005–1.030)
pH: 6 (ref 5.0–8.0)

## 2017-09-09 LAB — CHLAMYDIA/NGC RT PCR (ARMC ONLY)
Chlamydia Tr: NOT DETECTED
N gonorrhoeae: NOT DETECTED

## 2017-09-09 LAB — BASIC METABOLIC PANEL
Anion gap: 6 (ref 5–15)
BUN: 9 mg/dL (ref 6–20)
CO2: 26 mmol/L (ref 22–32)
Calcium: 8.6 mg/dL — ABNORMAL LOW (ref 8.9–10.3)
Chloride: 106 mmol/L (ref 98–111)
Creatinine, Ser: 0.52 mg/dL (ref 0.44–1.00)
GFR calc Af Amer: >60 mL/min (ref >60)
GFR calc non Af Amer: >60 mL/min (ref >60)
Glucose, Bld: 190 mg/dL — ABNORMAL HIGH (ref 70–99)
Potassium: 3.4 mmol/L — ABNORMAL LOW (ref 3.5–5.1)
Sodium: 138 mmol/L (ref 135–145)

## 2017-09-09 LAB — CBC
HCT: 40 % (ref 35.0–47.0)
Hemoglobin: 13.7 g/dL (ref 12.0–16.0)
MCH: 31.2 pg (ref 26.0–34.0)
MCHC: 34.1 g/dL (ref 32.0–36.0)
MCV: 91.3 fL (ref 80.0–100.0)
Platelets: 336 10*3/uL (ref 150–440)
RBC: 4.38 MIL/uL (ref 3.80–5.20)
RDW: 13.5 % (ref 11.5–14.5)
WBC: 10.2 10*3/uL (ref 3.6–11.0)

## 2017-09-09 LAB — HCG, QUANTITATIVE, PREGNANCY: hCG, Beta Chain, Quant, S: 1 m[IU]/mL (ref <5)

## 2017-09-09 MED ORDER — SODIUM CHLORIDE 0.9 % IV BOLUS
1000.0000 mL | Freq: Once | INTRAVENOUS | Status: AC
Start: 2017-09-09 — End: 2017-09-09
  Administered 2017-09-09: 1000 mL via INTRAVENOUS

## 2017-09-09 MED ORDER — KETOROLAC TROMETHAMINE 30 MG/ML IJ SOLN
15.0000 mg | Freq: Once | INTRAMUSCULAR | Status: AC
  Administered 2017-09-09: 15 mg via INTRAVENOUS
  Filled 2017-09-09: qty 1

## 2017-09-09 NOTE — Discharge Instructions (Signed)

## 2017-09-09 NOTE — ED Triage Notes (Signed)
Pt comes into the ED via POV c/o lower back pain that started last Saturday.  Patient has been using OTC medication with no relief.  Patient states she has a h/o kidney stones in the past and she believes this may be related.  Patient in NAD at this time with even and unlabored respirations.  Denies any discomfort with urination, but states she has had increased urination.

## 2017-09-09 NOTE — ED Notes (Signed)
Patient transported to CT 

## 2017-09-09 NOTE — ED Provider Notes (Signed)
[facility] Emergency Department Provider Note  ____________________________________________  Time seen: Approximately 3:16 PM  I have reviewed the triage vital signs and the nursing notes.   HISTORY  Chief Complaint Back Pain   HPI Amber Nielsen is a 32 y.o. female with a history of kidney stones, pyelonephritis, hypertension, IBS who presents for evaluation of left flank pain.  Patient endorses 5 days of dull intermittent left flank/left lower back pain radiating to the left lower abdomen.  The pain has become constant over the last few days.  The pain is mild when she is at rest and it becomes more severe with movement of her torso.  She denies any trauma, fall, or doing any extraneous exercise.  The pain is not reproducible on palpation.  She has had intermittent nausea but no vomiting.  She does report increased urination but no dysuria, no fever chills, no vomiting, no diarrhea or constipation.  No prior abdominal surgeries.  No chest pain or shortness of breath.  No vaginal discharge.  LMP was a month ago.  Past Medical History:  Diagnosis Date  . Acute pyelonephritis   . Anxiety   . Asthma    allergy related. rodent animals  . Bipolar 1 disorder (HCC)   . Depression   . H/O varicella   . Headache(784.0)   . History of gestational diabetes mellitus (GDM) 01/12/2017  . History of pre-eclampsia 01/12/2017  . HTN (hypertension)   . IBS (irritable bowel syndrome)   . Obesity   . Pregnancy induced hypertension   . Sepsis secondary to UTI (HCC) 08/26/2016  . Ureteral stone with hydronephrosis 08/27/2016    Patient Active Problem List   Diagnosis Date Noted  . Tobacco abuse 02/17/2017  . 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 Cannabis abuse 02/02/2016  . Morbid obesity (HCC) 12/15/2012    Past Surgical History:  Procedure Laterality Date  . COLONOSCOPY    . CYSTOSCOPY W/ URETERAL STENT PLACEMENT  Left 08/26/2016   Procedure: CYSTOSCOPY WITH RETROGRADE PYELOGRAM/URETERAL STENT PLACEMENT;  Surgeon: [name], Stephen, MD;  [facility];  Service: Urology;  Laterality: Left;  . CYSTOSCOPY WITH RETROGRADE PYELOGRAM, URETEROSCOPY AND STENT PLACEMENT Left 09/14/2016   Procedure: CYSTOSCOPY WITH URETEROSCOPY , STONE EXTRACTION AND STENT REMOVAL;  Surgeon: [name], Stephen, MD;  Location: [facility] ORS;  Service: Urology;  Laterality: Left;  . TOOTH EXTRACTION      Prior to Admission medications   Medication Sig Start Date End Date Taking? Authorizing Provider  albuterol (PROVENTIL HFA;VENTOLIN HFA) 108 (90 Base) MCG/ACT inhaler Inhale 2 puffs into the lungs every 6 (six) hours as needed for wheezing or shortness of breath.    [provider]  citalopram (CELEXA) 20 MG tablet Take 1 tablet (20 mg total) daily by mouth. Patient taking differently: Take 20 mg by mouth at bedtime.  01/12/17   Allie Bossierove, Myra C, MD  cyclobenzaprine (FLEXERIL) 10 MG tablet Take 1 tablet (10 mg total) by mouth 2 (two) times daily as needed for muscle spasms. 05/09/17   Fayrene Helperran, Bowie, PA-C  ibuprofen (ADVIL,MOTRIN) 600 MG tablet Take 1 tablet (600 mg total) by mouth every 6 (six) hours as needed. 05/09/17   Fayrene Helperran, [place], PA-C  Prenatal Vit-Fe Fumarate-FA (PRENATAL MULTIVITAMIN) TABS tablet Take 1 tablet by mouth daily at 12 noon.    [provider]    Allergies Celery oil; Cymbalta [duloxetine hcl]; Lamictal [lamotrigine]; and Prozac [fluoxetine hcl]  Family History  Problem Relation Age  of Onset  . Heart disease Father   . Heart attack Father   . Nephrolithiasis Father   . Colon polyps Father   . Mental retardation Maternal Aunt   . Diabetes Maternal Grandmother   . Cancer Maternal Grandmother        ovarian and breast  . Diabetes Maternal Grandfather   . Diabetes Paternal Grandmother   . Hypertension Paternal Grandmother   . Heart disease Paternal Grandmother   . Colon polyps Paternal Grandmother   .  Diabetes Paternal Grandfather   . Colon polyps Paternal Uncle     Social History Social History   Tobacco Use  . Smoking status: Former Smoker    Packs/day: 1.00    Types: Cigarettes    Last attempt to quit: 12/21/2016    Years since quitting: 0.7  . Smokeless tobacco: Never Used  Substance Use Topics  . Alcohol use: No    Alcohol/week: 0.0 oz  . Drug use: No    Comment: molly/THC    Review of Systems  Constitutional: Negative for fever. Eyes: Negative for visual changes. ENT: Negative for sore throat. Neck: No neck pain  Cardiovascular: Negative for chest pain. Respiratory: Negative for shortness of breath. Gastrointestinal: + LLQ abdominal pain. No vomiting or diarrhea. Genitourinary: Negative for dysuria. + frequency Musculoskeletal: + Left lower back pain. Skin: Negative for rash. Neurological: Negative for headaches, weakness or numbness. Psych: No SI or HI  ____________________________________________   PHYSICAL EXAM:  VITAL SIGNS: ED Triage Vitals [09/09/17 1233]  Enc Vitals Group     BP 121/69     Pulse Rate 76     Resp 16     Temp 98.1 F (36.7 C)     Temp Source Oral     SpO2 97 %     Weight 290 lb (131.5 kg)     Height 5\' 10"  (1.778 m)     Head Circumference      Peak Flow      Pain Score 8     Pain Loc      Pain Edu?      Excl. in GC?     Constitutional: Alert and oriented. Well appearing and in no apparent distress. HEENT:      Head: Normocephalic and atraumatic.         Eyes: Conjunctivae are normal. Sclera is non-icteric.       Mouth/Throat: Mucous membranes are moist.       Neck: Supple with no signs of meningismus. Cardiovascular: Regular rate and rhythm. No murmurs, gallops, or rubs. 2+ symmetrical distal pulses are present in all extremities. No JVD. Respiratory: Normal respiratory effort. Lungs are clear to auscultation bilaterally. No wheezes, crackles, or rhonchi.  Gastrointestinal: Soft, mild LLQ ttp, and non distended with  positive bowel sounds. No rebound or guarding. Genitourinary: No CVA tenderness. Pelvic exam: Normal external genitalia, no rashes or lesions. Normal cervical mucus. Os closed. No cervical motion tenderness.  No uterine or adnexal tenderness.   Musculoskeletal: Nontender with normal range of motion in all extremities. No edema, cyanosis, or erythema of extremities. Neurologic: Normal speech and language. Face is symmetric. Moving all extremities. No gross focal neurologic deficits are appreciated. Skin: Skin is warm, dry and intact. No rash noted. Psychiatric: Mood and affect are normal. Speech and behavior are normal.  ____________________________________________   LABS (all labs ordered are listed, but only abnormal results are displayed)  Labs Reviewed  BASIC METABOLIC PANEL - Abnormal; Notable for the following components:  Result Value   Potassium 3.4 (*)    Glucose, Bld 190 (*)    Calcium 8.6 (*)    All other components within normal limits  WET PREP, GENITAL  CHLAMYDIA/NGC RT PCR ([facility] ONLY)  CBC  HCG, QUANTITATIVE, PREGNANCY  URINALYSIS, COMPLETE (UACMP) WITH MICROSCOPIC  POC URINE PREG, ED   ____________________________________________  EKG  none  ____________________________________________  RADIOLOGY  I have personally reviewed the images performed during this visit and I agree with the Radiologist's read.   Interpretation by Radiologist:  Ct Abdomen Pelvis Wo Contrast  Result Date: 09/09/2017 CLINICAL DATA:  Low back pain EXAM: CT ABDOMEN AND PELVIS WITHOUT CONTRAST TECHNIQUE: Multidetector CT imaging of the abdomen and pelvis was performed following the standard protocol without IV contrast. COMPARISON:  None. FINDINGS: Lower chest: No acute abnormality. Hepatobiliary: No focal liver abnormality is seen. No gallstones, gallbladder wall thickening, or biliary dilatation. Pancreas: Unremarkable. No pancreatic ductal dilatation or surrounding  inflammatory changes. Spleen: Normal in size without focal abnormality. Adrenals/Urinary Tract: Adrenal glands are within normal limits bilaterally. The kidneys are well visualized bilaterally with a tiny nonobstructing stone in the lower pole of the left kidney. No obstructive changes are seen. The bladder is partially decompressed. Stomach/Bowel: Scattered diverticular change of the colon is noted without evidence of diverticulitis. The appendix is well visualized and within normal limits. No obstructive or inflammatory changes are noted. Vascular/Lymphatic: No significant vascular findings are present. No enlarged abdominal or pelvic lymph nodes. Reproductive: Uterus and bilateral adnexa are unremarkable. Other: Small fat containing umbilical hernia is noted. No ascites is identified. Musculoskeletal: No acute or significant osseous findings. IMPRESSION: Tiny nonobstructing left renal stone. No acute abnormality is noted. Electronically Signed   By: Alcide Clever M.D.   On: 09/09/2017 14:36     ____________________________________________   PROCEDURES  Procedure(s) performed: None Procedures Critical Care performed:  None ____________________________________________   INITIAL IMPRESSION / ASSESSMENT AND PLAN / ED COURSE  32 y.o. female with a history of kidney stones, pyelonephritis, hypertension, IBS who presents for evaluation of left flank pain/ LLQ abdominal pain x 5 days associated with intermittent nausea and urinary frequency.  Patient is well-appearing, no distress, she has normal vital signs, abdomen is soft with mild left lower quadrant tenderness, no rebound or guarding, no flank tenderness.  CT abdomen pelvis showed no evidence of kidney stone or any other acute intra-abdominal pathology.  Pelvic exam was done and it was unremarkable.  Wet prep, GC and Chlamydia are pending.  Transvaginal ultrasound pending to evaluate for ovarian pathology such as cyst or torsion.  Pregnancy test is  negative.  Labs are otherwise within normal limits.  UA is pending to rule out UTI.  Patient given IV fluids and Toradol for symptom relief.  Care transferred to Dr. Roxan Hockey.      As part of my medical decision making, I reviewed the following data within the electronic MEDICAL RECORD NUMBER Nursing notes reviewed and incorporated, Labs reviewed , Old chart reviewed, Radiograph reviewed , Notes from prior ED visits and [place] Controlled Substance Database    Pertinent labs & imaging results that were available during my care of the patient were reviewed by me and considered in my medical decision making (see chart for details).    ____________________________________________   FINAL CLINICAL IMPRESSION(S) / ED DIAGNOSES  Final diagnoses:  LLQ abdominal pain      NEW MEDICATIONS STARTED DURING THIS VISIT:  ED Discharge Orders    None  Note:  This document was prepared using Dragon voice recognition software and may include unintentional dictation errors.    Don Perking, [place], MD 09/09/17 [phone]

## 2017-09-09 NOTE — ED Provider Notes (Signed)
Patient received in sign-out from Dr. Don PerkingVeronese.  Workup and evaluation pending ultrasound and follow-up on labs.  Ultrasound is reassuring.  No evidence of GC cc or abnormality on wet prep.  Patient stable and appropriate for outpatient follow-up.      Willy Eddyobinson, Delayza Lungren, MD 09/09/17 [phone]

## 2017-09-09 NOTE — ED Notes (Signed)
Patient transported to Ultrasound 

## 2017-11-22 ENCOUNTER — Emergency Department: Payer: Medicaid Other

## 2017-11-22 ENCOUNTER — Other Ambulatory Visit: Payer: Self-pay

## 2017-11-22 ENCOUNTER — Emergency Department
Admission: EM | Admit: 2017-11-22 | Discharge: 2017-11-22 | Disposition: A | Discharge status: Home / Self Care | Payer: Medicaid Other | Attending: Emergency Medicine | Admitting: Emergency Medicine

## 2017-11-22 ENCOUNTER — Encounter: Payer: Self-pay | Admitting: Emergency Medicine

## 2017-11-22 DIAGNOSIS — K760 Fatty (change of) liver, not elsewhere classified: Secondary | ICD-10-CM | POA: Diagnosis not present

## 2017-11-22 DIAGNOSIS — I10 Essential (primary) hypertension: Secondary | ICD-10-CM | POA: Diagnosis not present

## 2017-11-22 DIAGNOSIS — R197 Diarrhea, unspecified: Secondary | ICD-10-CM | POA: Diagnosis not present

## 2017-11-22 DIAGNOSIS — Z79899 Other long term (current) drug therapy: Secondary | ICD-10-CM | POA: Insufficient documentation

## 2017-11-22 DIAGNOSIS — J45909 Unspecified asthma, uncomplicated: Secondary | ICD-10-CM | POA: Diagnosis not present

## 2017-11-22 DIAGNOSIS — R1010 Upper abdominal pain, unspecified: Secondary | ICD-10-CM

## 2017-11-22 DIAGNOSIS — Z87891 Personal history of nicotine dependence: Secondary | ICD-10-CM | POA: Diagnosis not present

## 2017-11-22 DIAGNOSIS — R1011 Right upper quadrant pain: Secondary | ICD-10-CM | POA: Diagnosis present

## 2017-11-22 LAB — URINALYSIS, COMPLETE (UACMP) WITH MICROSCOPIC
Bacteria, UA: NONE SEEN
Bilirubin Urine: NEGATIVE
Glucose, UA: NEGATIVE mg/dL
Hgb urine dipstick: NEGATIVE
Ketones, ur: NEGATIVE mg/dL
Leukocytes, UA: NEGATIVE
Nitrite: NEGATIVE
Protein, ur: NEGATIVE mg/dL
Specific Gravity, Urine: 1.017 (ref 1.005–1.030)
pH: 6 (ref 5.0–8.0)

## 2017-11-22 LAB — COMPREHENSIVE METABOLIC PANEL
ALT: 36 U/L (ref 0–44)
AST: 30 U/L (ref 15–41)
Albumin: 3.6 g/dL (ref 3.5–5.0)
Alkaline Phosphatase: 66 U/L (ref 38–126)
Anion gap: 8 (ref 5–15)
BUN: 11 mg/dL (ref 6–20)
CO2: 26 mmol/L (ref 22–32)
Calcium: 8.5 mg/dL — ABNORMAL LOW (ref 8.9–10.3)
Chloride: 104 mmol/L (ref 98–111)
Creatinine, Ser: 0.72 mg/dL (ref 0.44–1.00)
GFR calc Af Amer: >60 mL/min (ref >60)
GFR calc non Af Amer: >60 mL/min (ref >60)
Glucose, Bld: 127 mg/dL — ABNORMAL HIGH (ref 70–99)
Potassium: 4.1 mmol/L (ref 3.5–5.1)
Sodium: 138 mmol/L (ref 135–145)
Total Bilirubin: 0.6 mg/dL (ref 0.3–1.2)
Total Protein: 7.6 g/dL (ref 6.5–8.1)

## 2017-11-22 LAB — CBC
HCT: 41 % (ref 35.0–47.0)
Hemoglobin: 14 g/dL (ref 12.0–16.0)
MCH: 31.1 pg (ref 26.0–34.0)
MCHC: 34.1 g/dL (ref 32.0–36.0)
MCV: 91.1 fL (ref 80.0–100.0)
Platelets: 332 10*3/uL (ref 150–440)
RBC: 4.5 MIL/uL (ref 3.80–5.20)
RDW: 13.1 % (ref 11.5–14.5)
WBC: 8.2 10*3/uL (ref 3.6–11.0)

## 2017-11-22 LAB — LIPASE, BLOOD: Lipase: 46 U/L (ref 11–51)

## 2017-11-22 LAB — POCT PREGNANCY, URINE: Preg Test, Ur: NEGATIVE

## 2017-11-22 LAB — FIBRIN DERIVATIVES D-DIMER (ARMC ONLY): Fibrin derivatives D-dimer (ARMC): 276.49 ng/mL (FEU) (ref 0.00–499.00)

## 2017-11-22 MED ORDER — NAPROXEN 500 MG PO TABS: 500.0000 mg | ORAL_TABLET | Freq: Two times a day (BID) | ORAL | 2 refills | Status: DC

## 2017-11-22 NOTE — ED Notes (Signed)
Blue top sent at this time from straight stick to [facility] Hand.

## 2017-11-22 NOTE — ED Notes (Signed)
Pt c/o intermittent upper abd pain, worse to the RUQ for the past 2 weeks with nausea. Denies vomiting, states she has some diarrhea but has issues with IBS. States she has had pain similar in the past and they told her, her gall bladder was fine.

## 2017-11-22 NOTE — ED Provider Notes (Signed)
[facility] Emergency Department Provider Note   ____________________________________________    I have reviewed the triage vital signs and the nursing notes.   HISTORY  Chief Complaint Abdominal Pain     HPI Amber Nielsen is a 32 y.o. female who presents with complaints of right-sided abdominal pain which has been intermittent over the last several weeks but has been more constant over the last 24 hours.  Patient reports she has had ultrasounds of her gallbladder before and they have been negative.  She does not know what is causing his pain.  She denies nausea or vomiting.  No fevers or chills.  Does have a history of pyelonephritis which to hospitalization.  Denies dysuria.  No frequency.  Has not taken anything for this pain   Past Medical History:  Diagnosis Date  . Acute pyelonephritis   . Anxiety   . Asthma    allergy related. rodent animals  . Bipolar 1 disorder (HCC)   . Depression   . H/O varicella   . Headache(784.0)   . History of gestational diabetes mellitus (GDM) 01/12/2017  . History of pre-eclampsia 01/12/2017  . HTN (hypertension)   . IBS (irritable bowel syndrome)   . Obesity   . Pregnancy induced hypertension   . Sepsis secondary to UTI (HCC) 08/26/2016  . Ureteral stone with hydronephrosis 08/27/2016    Patient Active Problem List   Diagnosis Date Noted  . Tobacco abuse 02/17/2017  . 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 Cannabis abuse 02/02/2016  . Morbid obesity (HCC) 12/15/2012    Past Surgical History:  Procedure Laterality Date  . COLONOSCOPY    . CYSTOSCOPY W/ URETERAL STENT PLACEMENT Left 08/26/2016   Procedure: CYSTOSCOPY WITH RETROGRADE PYELOGRAM/URETERAL STENT PLACEMENT;  Surgeon: Marcine Matar, MD;  Location: [facility] OR;  Service: Urology;  Laterality: Left;  . CYSTOSCOPY WITH RETROGRADE PYELOGRAM, URETEROSCOPY AND STENT PLACEMENT Left 09/14/2016   Procedure: CYSTOSCOPY WITH URETEROSCOPY , STONE EXTRACTION AND STENT REMOVAL;  Surgeon: Marcine Matar, MD;  Location: [facility] ORS;  Service: Urology;  Laterality: Left;  . TOOTH EXTRACTION      Prior to Admission medications   Medication Sig Start Date End Date Taking? Authorizing Provider  albuterol (PROVENTIL HFA;VENTOLIN HFA) 108 (90 Base) MCG/ACT inhaler Inhale 2 puffs into the lungs every 6 (six) hours as needed for wheezing or shortness of breath.    [provider]  citalopram (CELEXA) 20 MG tablet Take 1 tablet (20 mg total) daily by mouth. Patient taking differently: Take 20 mg by mouth at bedtime.  01/12/17   Allie Bossier, MD  cyclobenzaprine (FLEXERIL) 10 MG tablet Take 1 tablet (10 mg total) by mouth 2 (two) times daily as needed for muscle spasms. 05/09/17   Fayrene Helper, PA-C  ibuprofen (ADVIL,MOTRIN) 600 MG tablet Take 1 tablet (600 mg total) by mouth every 6 (six) hours as needed. 05/09/17   Fayrene Helper, PA-C  naproxen (NAPROSYN) 500 MG tablet Take 1 tablet (500 mg total) by mouth 2 (two) times daily with a meal. 11/22/17   Jene Every, MD  Prenatal Vit-Fe Fumarate-FA (PRENATAL MULTIVITAMIN) TABS tablet Take 1 tablet by mouth daily at 12 noon.    [provider]     Allergies Celery oil; Cymbalta [duloxetine hcl]; Lamictal [lamotrigine]; and Prozac [fluoxetine hcl]  Family History  Problem Relation Age of Onset  . Heart disease Father   . Heart attack Father   . Nephrolithiasis Father   .  Colon polyps Father   . Mental retardation Maternal Aunt   . Diabetes Maternal Grandmother   . Cancer Maternal Grandmother        ovarian and breast  . Diabetes Maternal Grandfather   . Diabetes Paternal Grandmother   . Hypertension Paternal Grandmother   . Heart disease Paternal Grandmother   . Colon polyps Paternal Grandmother   . Diabetes Paternal Grandfather   . Colon polyps Paternal Uncle     Social History Social History   Tobacco Use  . Smoking status:  Former Smoker    Packs/day: 1.00    Types: Cigarettes    Last attempt to quit: 12/21/2016    Years since quitting: 0.9  . Smokeless tobacco: Never Used  Substance Use Topics  . Alcohol use: No    Alcohol/week: 0.0 standard drinks  . Drug use: No    Comment: molly/THC    Review of Systems  Constitutional: No fever/chills Eyes: No visual changes.  ENT: No sore throat. Cardiovascular: Denies chest pain. Respiratory: Denies shortness of breath. Gastrointestinal: As above Genitourinary: Negative for dysuria. Musculoskeletal: Negative for back pain. Skin: Negative for rash. Neurological: Negative for headaches or weakness   ____________________________________________   PHYSICAL EXAM:  VITAL SIGNS: ED Triage Vitals  Enc Vitals Group     BP 11/22/17 0833 118/62     Pulse Rate 11/22/17 0833 70     Resp 11/22/17 0833 20     Temp 11/22/17 0833 98.2 F (36.8 C)     Temp Source 11/22/17 0833 Oral     SpO2 11/22/17 0833 97 %     Weight 11/22/17 0835 131.5 kg (290 lb)     Height 11/22/17 0835 1.803 m (5\' 11" )     Head Circumference --      Peak Flow --      Pain Score 11/22/17 0835 7     Pain Loc --      Pain Edu? --      Excl. in GC? --     Constitutional: Alert and oriented. No acute distress.   Nose: No congestion/rhinnorhea. Mouth/Throat: Mucous membranes are moist.    Cardiovascular: Normal rate, regular rhythm. Grossly normal heart sounds.  Good peripheral circulation. Respiratory: Normal respiratory effort.  No retractions. Lungs CTAB. Gastrointestinal: Mild tenderness right upper quadrant, no epigastric tenderness to palpation, no distention Genitourinary: deferred Musculoskeletal: No lower extremity tenderness nor edema.  Warm and well perfused Neurologic:  Normal speech and language. No gross focal neurologic deficits are appreciated.  Skin:  Skin is warm, dry and intact. No rash noted. Psychiatric: Mood and affect are normal. Speech and behavior are  normal.  ____________________________________________   LABS (all labs ordered are listed, but only abnormal results are displayed)  Labs Reviewed  COMPREHENSIVE METABOLIC PANEL - Abnormal; Notable for the following components:      Result Value   Glucose, Bld 127 (*)    Calcium 8.5 (*)    All other components within normal limits  URINALYSIS, COMPLETE (UACMP) WITH MICROSCOPIC - Abnormal; Notable for the following components:   Color, Urine YELLOW (*)    APPearance CLEAR (*)    All other components within normal limits  LIPASE, BLOOD  CBC  FIBRIN DERIVATIVES D-DIMER ([facility] ONLY)  POC URINE PREG, ED  POCT PREGNANCY, URINE   ____________________________________________  EKG   ____________________________________________  RADIOLOGY  Ultrasound demonstrates normal gallbladder but suspicious for fatty liver Chest x-ray negative for pneumonia ____________________________________________   PROCEDURES  Procedure(s) performed: No  Procedures   Critical Care performed: No ____________________________________________   INITIAL IMPRESSION / ASSESSMENT AND PLAN / ED COURSE  Pertinent labs & imaging results that were available during my care of the patient were reviewed by me and considered in my medical decision making (see chart for details).  Patient presents with right upper quadrant discomfort, differential includes cholelithiasis, pneumonia, pleurisy, PE  D-dimer is normal and patient is low risk for PE.  Ultrasound demonstrates normal gallbladder however she does not have fatty liver this may be causing her vague discomfort.  X-rays negative for pneumonia.  Will treat with NSAIDs have the patient follow-up with PCP    ____________________________________________   FINAL CLINICAL IMPRESSION(S) / ED DIAGNOSES  Final diagnoses:  Upper abdominal pain  Fatty liver        Note:  This document was prepared using Dragon voice recognition software and may  include unintentional dictation errors.    Jene EveryKinner, Gionni Vaca, MD 11/22/17 [phone]

## 2017-11-22 NOTE — ED Triage Notes (Signed)
R upper abdominal pain x 2 weeks, increased yesterday.

## 2018-01-17 ENCOUNTER — Emergency Department (HOSPITAL_COMMUNITY)
Admission: EM | Admit: 2018-01-17 | Discharge: 2018-01-17 | Disposition: A | Discharge status: Home / Self Care | Payer: Medicaid Other | Attending: Emergency Medicine | Admitting: Emergency Medicine

## 2018-01-17 ENCOUNTER — Emergency Department (HOSPITAL_COMMUNITY): Payer: Medicaid Other

## 2018-01-17 ENCOUNTER — Other Ambulatory Visit: Payer: Self-pay

## 2018-01-17 ENCOUNTER — Encounter (HOSPITAL_COMMUNITY): Payer: Self-pay | Admitting: *Deleted

## 2018-01-17 DIAGNOSIS — G8929 Other chronic pain: Secondary | ICD-10-CM

## 2018-01-17 DIAGNOSIS — R079 Chest pain, unspecified: Secondary | ICD-10-CM | POA: Insufficient documentation

## 2018-01-17 DIAGNOSIS — Z87891 Personal history of nicotine dependence: Secondary | ICD-10-CM | POA: Diagnosis not present

## 2018-01-17 DIAGNOSIS — R55 Syncope and collapse: Secondary | ICD-10-CM | POA: Diagnosis present

## 2018-01-17 DIAGNOSIS — I10 Essential (primary) hypertension: Secondary | ICD-10-CM | POA: Diagnosis not present

## 2018-01-17 DIAGNOSIS — D72829 Elevated white blood cell count, unspecified: Secondary | ICD-10-CM | POA: Insufficient documentation

## 2018-01-17 DIAGNOSIS — R519 Headache, unspecified: Secondary | ICD-10-CM

## 2018-01-17 DIAGNOSIS — J45909 Unspecified asthma, uncomplicated: Secondary | ICD-10-CM | POA: Diagnosis not present

## 2018-01-17 DIAGNOSIS — R0789 Other chest pain: Secondary | ICD-10-CM

## 2018-01-17 DIAGNOSIS — Z79899 Other long term (current) drug therapy: Secondary | ICD-10-CM | POA: Diagnosis not present

## 2018-01-17 DIAGNOSIS — R51 Headache: Secondary | ICD-10-CM

## 2018-01-17 LAB — I-STAT BETA HCG BLOOD, ED (MC, WL, AP ONLY): I-stat hCG, quantitative: <5 m[IU]/mL (ref <5)

## 2018-01-17 LAB — URINALYSIS, ROUTINE W REFLEX MICROSCOPIC
Bilirubin Urine: NEGATIVE
Glucose, UA: NEGATIVE mg/dL
Ketones, ur: NEGATIVE mg/dL
Leukocytes, UA: NEGATIVE
Nitrite: NEGATIVE
Protein, ur: NEGATIVE mg/dL
Specific Gravity, Urine: 1.02 (ref 1.005–1.030)
pH: 6 (ref 5.0–8.0)

## 2018-01-17 LAB — CBC WITH DIFFERENTIAL/PLATELET
Abs Immature Granulocytes: 0.04 10*3/uL (ref 0.00–0.07)
Basophils Absolute: 0.1 10*3/uL (ref 0.0–0.1)
Basophils Relative: 0 %
Eosinophils Absolute: 0.1 10*3/uL (ref 0.0–0.5)
Eosinophils Relative: 1 %
HCT: 46.5 % — ABNORMAL HIGH (ref 36.0–46.0)
Hemoglobin: 15.1 g/dL — ABNORMAL HIGH (ref 12.0–15.0)
Immature Granulocytes: 0 %
Lymphocytes Relative: 17 %
Lymphs Abs: 2.1 10*3/uL (ref 0.7–4.0)
MCH: 29.9 pg (ref 26.0–34.0)
MCHC: 32.5 g/dL (ref 30.0–36.0)
MCV: 92.1 fL (ref 80.0–100.0)
Monocytes Absolute: 0.7 10*3/uL (ref 0.1–1.0)
Monocytes Relative: 6 %
Neutro Abs: 9.3 10*3/uL — ABNORMAL HIGH (ref 1.7–7.7)
Neutrophils Relative %: 76 %
Platelets: 373 10*3/uL (ref 150–400)
RBC: 5.05 MIL/uL (ref 3.87–5.11)
RDW: 12.5 % (ref 11.5–15.5)
WBC: 12.4 10*3/uL — ABNORMAL HIGH (ref 4.0–10.5)
nRBC: 0 % (ref 0.0–0.2)

## 2018-01-17 LAB — I-STAT CHEM 8, ED
BUN: 8 mg/dL (ref 6–20)
Calcium, Ion: 1.18 mmol/L (ref 1.15–1.40)
Chloride: 100 mmol/L (ref 98–111)
Creatinine, Ser: 0.6 mg/dL (ref 0.44–1.00)
Glucose, Bld: 70 mg/dL (ref 70–99)
HCT: 43 % (ref 36.0–46.0)
Hemoglobin: 14.6 g/dL (ref 12.0–15.0)
Potassium: 3.8 mmol/L (ref 3.5–5.1)
Sodium: 137 mmol/L (ref 135–145)
TCO2: 30 mmol/L (ref 22–32)

## 2018-01-17 LAB — BASIC METABOLIC PANEL
Anion gap: 6 (ref 5–15)
BUN: 8 mg/dL (ref 6–20)
CO2: 29 mmol/L (ref 22–32)
Calcium: 9 mg/dL (ref 8.9–10.3)
Chloride: 102 mmol/L (ref 98–111)
Creatinine, Ser: 0.6 mg/dL (ref 0.44–1.00)
GFR calc Af Amer: >60 mL/min (ref >60)
GFR calc non Af Amer: >60 mL/min (ref >60)
Glucose, Bld: 78 mg/dL (ref 70–99)
Potassium: 3.8 mmol/L (ref 3.5–5.1)
Sodium: 137 mmol/L (ref 135–145)

## 2018-01-17 LAB — CBG MONITORING, ED: Glucose-Capillary: 85 mg/dL (ref 70–99)

## 2018-01-17 LAB — TSH: TSH: 0.949 u[IU]/mL (ref 0.350–4.500)

## 2018-01-17 LAB — I-STAT TROPONIN, ED: Troponin i, poc: 0 ng/mL (ref 0.00–0.08)

## 2018-01-17 NOTE — ED Triage Notes (Signed)
Pt passed out at work on Thursday  last week. Pt reports  On -going HA and feels dizzy . Pt went to  [facility]  In [place]  And was told she had an EKG that was abnormal the patient reports . Pt reports she now has Lt sided CP t\hat radiates into Lt side of neck. Pt also  Reports her Dad had an MI at 30 years old.

## 2018-01-17 NOTE — ED Notes (Signed)
CBG collected. Result "15." RN, Magda Paganini and Jansen, notified.

## 2018-01-17 NOTE — ED Notes (Signed)
Pt back from CT

## 2018-01-17 NOTE — ED Notes (Signed)
Pt verbalized understanding of discharge paperwork and follow-up care.  °

## 2018-01-17 NOTE — ED Provider Notes (Signed)
Medical screening examination/treatment/procedure(s) were conducted as a shared visit with non-physician practitioner(s) and myself.  I personally evaluated the patient during the encounter.  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  She denies any specific exertional symptoms but relates more positional symptoms when she stands or sits up when she gets symptomatic with lightheadedness headaches chest heaviness and feeling like her vision is going to go black.  This is totally relieved when she lays down.  She is only minimally symptomatic at this time.  On exam she has a soft nontender abdomen, clear heart and lung sounds, no murmurs rubs or gallops, neurologically the patient is able to perform all of the tasks that I have asked him for including cranial nerves III through XII and has normal strength and sensation in all 4 extremities.  Her memory is intact, there is no facial droop, she has no signs of edema of her legs and is very low risk for both pulmonary embolism and acute coronary syndrome.  I looked at her EKG, see interpretation below, no signs of acute ischemia.  We will proceed with labs and a CT scan of the brain, I anticipate that the patient will be discharged home safely.  She is Artie undergone cardiac  Holter monitor testing in the past and states that she was having palpitations for years, she was told it was nothing after the monitor test came back.  She still has this occasionally, she denies any palpitations today.  ED ECG REPORT  I personally interpreted this EKG   Date: 01/18/2018   Rate: 80  Rhythm: normal sinus rhythm  QRS Axis: normal  Intervals: normal  ST/T Wave abnormalities: diffuse mild ST elevation in all leads, no reciprocal changes  Conduction Disutrbances:none  Narrative Interpretation:   Old EKG Reviewed: none available    Eber Hong, MD 01/18/18 [phone]

## 2018-01-17 NOTE — Discharge Instructions (Addendum)
You have been seen today for an abdominal EKG, lightheadedness, chest tightness, and headaches. Please read and follow all provided instructions.   1. Medications: usual home medications 2. Treatment: rest, drink plenty of fluids 3. Follow Up: Please follow up with your primary doctor in 2 days for discussion of your diagnoses and further evaluation after today's visit; if you do not have a primary care doctor use the resource guide provided to find one; Please return to the ER for any new or worsening symptoms.   Take medications as prescribed. Return to the emergency room for worsening condition or new concerning symptoms. Follow up with your regular doctor. If you don't have a regular doctor use one of the numbers below to establish a primary care doctor.  Emergency Department Resource Guide 1) Find a Doctor and Pay Out of Pocket Although you won't have to find out who is covered by your insurance plan, it is a good idea to ask around and get recommendations. You will then need to call the office and see if the doctor you have chosen will accept you as a new patient and what types of options they offer for patients who are self-pay. Some doctors offer discounts or will set up payment plans for their patients who do not have insurance, but you will need to ask so you aren't surprised when you get to your appointment.  2) Contact Your Local Health Department Not all health departments have doctors that can see patients for sick visits, but many do, so it is worth a call to see if yours does. If you don't know where your local health department is, you can check in your phone book. The CDC also has a tool to help you locate your state's health department, and many state websites also have listings of all of their local health departments.  3) Find a Walk-in Clinic If your illness is not likely to be very severe or complicated, you may want to try a walk in clinic. These are popping up all over the  country in pharmacies, drugstores, and shopping centers. They're usually staffed by nurse practitioners or physician assistants that have been trained to treat common illnesses and complaints. They're usually fairly quick and inexpensive. However, if you have serious medical issues or chronic medical problems, these are probably not your best option.  No Primary Care Doctor: Call Health Connect at  [phone] - they can help you locate a primary care doctor that  accepts your insurance, provides certain services, etc. Physician Referral [phone]  Emergency Department Resource Guide 1) Find a Doctor and Pay Out of Pocket Although you won't have to find out who is covered by your insurance plan, it is a good idea to ask around and get recommendations. You will then need to call the office and see if the doctor you have chosen will accept you as a new patient and what types of options they offer for patients who are self-pay. Some doctors offer discounts or will set up payment plans for their patients who do not have insurance, but you will need to ask so you aren't surprised when you get to your appointment.  2) Contact Your Local Health Department Not all health departments have doctors that can see patients for sick visits, but many do, so it is worth a call to see if yours does. If you don't know where your local health department is, you can check in your phone book. The CDC also has a  tool to help you locate your state's health department, and many state websites also have listings of all of their local health departments.  3) Find a Walk-in Clinic If your illness is not likely to be very severe or complicated, you may want to try a walk in clinic. These are popping up all over the country in pharmacies, drugstores, and shopping centers. They're usually staffed by nurse practitioners or physician assistants that have been trained to treat common illnesses and complaints. They're usually  fairly quick and inexpensive. However, if you have serious medical issues or chronic medical problems, these are probably not your best option.  No Primary Care Doctor: Call Health Connect at  [phone] - they can help you locate a primary care doctor that  accepts your insurance, provides certain services, etc. Physician Referral Service- [phone]  Chronic Pain Problems: Organization         Address  Phone   Notes  [facility]  [phone] Patients need to be referred by their primary care doctor.   Medication Assistance: Organization         Address  Phone   Notes  [facility] Passistance [facility] [address] [place], [place] [address] [phone] --Must be a resident of [place] [facility] -- Must have NO insurance coverage whatsoever (no Medicaid/ Medicare, etc.) -- The pt. MUST have a primary care doctor that directs their care regularly and follows them in the community   Med[facility]ist  [phone]   [employer]  [phone]    Agencies that provide inexpensive medical care: Organization         Address  Phone   Notes  [facility]  [phone]   [facility]    [phone]   [facility] [address] [place], [place] [address] [phone]   [facility] [address], [place] [phone]   [facility]    [phone]   [facility]    [phone]) [phone]   [facility]  [address], [place] Phone:  [phone], Fax:  [phone] Hours of Operation:  9 am - 6 pm, M-F.  Also accepts Medicaid/Medicare and self-pay.  [facility]  [address], [place] Phone: [phone], Fax: [phone]. Hours of Operation:  8:30 am - 5:30 pm, M-F.  Also accepts Medicaid and self-pay.  [facility] [place] [address], [place] Phone: [phone])  [phone]   [facility] [address], [place], [place] [phone], Ext. [phone] Mondays & Thursdays: 7-9 AM.  First 15 patients are seen on a first come, first serve basis.    Medicaid-accepting [facility] Providers:  Organization         Address  Phone   Notes  [facility] [address], [place] [phone] Also accepts self-pay patients.  [facility] [address], [place]  [phone]   [facility] [address], [place] [phone]   [facility] [address] [place] [address], [place] [phone]   [facility] [address], [place]   [phone] Only accepts [place] patients after they have their name applied to their card.   Self-Pay (no insurance) in [facility]:  Organization  Address  Phone   Notes  Sickle Cell Patients, [facility] [address], [place] [phone]   [facility] [address], [place] [phone]   [facility]  [address],  [phone]   [facility]/Dr. Osei-Bonsu  [address] [place] [address], [place] or [phone], [place] [phone] Phone number for both [place] and [place] locations is the same.  [facility] [address], [place] [phone]   [facility] [address] [place] [address], [place] or [address] [phone]   [facility] [address], [place] [phone], phone; [phone]) [phone], fax Sees patients 1st and 3rd Saturday of every month.  Must not qualify for public or private insurance (i.e. Medicaid, Medicare, [place] [employer] Choice, Veterans' Benefits)  Household income should be no more than 200% of the poverty level The clinic cannot treat you if  you are pregnant or think you are pregnant  Sexually transmitted diseases are not treated at the clinic.

## 2018-01-17 NOTE — ED Provider Notes (Signed)
[facility] EMERGENCY DEPARTMENT Provider Note   CSN: [id number] Arrival date & time: 01/17/18  1206     History   Chief Complaint: chest tightness No chief complaint on file.   HPI Amber Nielsen is a 32 y.o. female presenting with presyncope and an abnormal EKG. Patient was seen at an urgent care today and was advised to come to the ER due to her abnormal EKG. Patient reports she has been having episodes of chest tightness, presyncope, headaches, lightheadedness, nausea, and palpitations intermittently every day since 11/07. Patient describes symptoms as chest tightness and states it radiates to her back and left shoulder. Patient reports using albuterol inhaler today without relief. Patient states standing and walking makes symptoms worse and resting makes symptoms better. Patient states father had a heart attack at age 20 due to taking OTC medications and painting. Patient denies leg pain or swelling, history of DVT/PE, or taking any oral contraceptives. Patient reports tobacco use and marijuana use. Patient denies any other drug use. Denies vomiting, chest pain, or recent illness.   HPI  Past Medical History:  Diagnosis Date  . Acute pyelonephritis   . Anxiety   . Asthma    allergy related. rodent animals  . Bipolar 1 disorder (HCC)   . Depression   . H/O varicella   . Headache(784.0)   . History of gestational diabetes mellitus (GDM) 01/12/2017  . History of pre-eclampsia 01/12/2017  . HTN (hypertension)   . IBS (irritable bowel syndrome)   . Obesity   . Pregnancy induced hypertension   . Sepsis secondary to UTI (HCC) 08/26/2016  . Ureteral stone with hydronephrosis 08/27/2016    Patient Active Problem List   Diagnosis Date Noted  . Tobacco abuse 02/17/2017  . 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 Cannabis abuse 02/02/2016  . Morbid obesity (HCC) 12/15/2012    Past Surgical History:  Procedure  Laterality Date  . COLONOSCOPY    . CYSTOSCOPY W/ URETERAL STENT PLACEMENT Left 08/26/2016   Procedure: CYSTOSCOPY WITH RETROGRADE PYELOGRAM/URETERAL STENT PLACEMENT;  Surgeon: Marcine Matar, MD;  Location: [facility] OR;  Service: Urology;  Laterality: Left;  . CYSTOSCOPY WITH RETROGRADE PYELOGRAM, URETEROSCOPY AND STENT PLACEMENT Left 09/14/2016   Procedure: CYSTOSCOPY WITH URETEROSCOPY , STONE EXTRACTION AND STENT REMOVAL;  Surgeon: Marcine Matar, MD;  Location: [facility] ORS;  Service: Urology;  Laterality: Left;  . TOOTH EXTRACTION       OB History    Gravida  4   Para  2   Term  2   Preterm  0   AB  2   Living  2     SAB  2   TAB  0   Ectopic  0   Multiple  0   Live Births  2            Home Medications    Prior to Admission medications   Medication Sig Start Date End Date Taking? Authorizing Provider  albuterol (PROVENTIL HFA;VENTOLIN HFA) 108 (90 Base) MCG/ACT inhaler Inhale 2 puffs into the lungs every 6 (six) hours as needed for wheezing or shortness of breath.   Yes [provider]  Cholecalciferol (VITAMIN D3) 50 MCG (2000 UT) TABS Take 2,000 Units by mouth daily.   Yes [provider]  citalopram (CELEXA) 20 MG tablet Take 1 tablet (20 mg total) daily by mouth. Patient taking differently: Take 30 mg by mouth at bedtime.  01/12/17  Yes Dove, Myra C,  MD  cyclobenzaprine (FLEXERIL) 10 MG tablet Take 1 tablet (10 mg total) by mouth 2 (two) times daily as needed for muscle spasms. Patient not taking: Reported on 01/17/2018 05/09/17   Fayrene Helper, PA-C  ibuprofen (ADVIL,MOTRIN) 600 MG tablet Take 1 tablet (600 mg total) by mouth every 6 (six) hours as needed. Patient not taking: Reported on 01/17/2018 05/09/17   Fayrene Helper, PA-C  naproxen (NAPROSYN) 500 MG tablet Take 1 tablet (500 mg total) by mouth 2 (two) times daily with a meal. Patient not taking: Reported on 01/17/2018 11/22/17   Jene Every, MD    Family History Family History  Problem  Relation Age of Onset  . Heart disease Father   . Heart attack Father   . Nephrolithiasis Father   . Colon polyps Father   . Mental retardation Maternal Aunt   . Diabetes Maternal Grandmother   . Cancer Maternal Grandmother        ovarian and breast  . Diabetes Maternal Grandfather   . Diabetes Paternal Grandmother   . Hypertension Paternal Grandmother   . Heart disease Paternal Grandmother   . Colon polyps Paternal Grandmother   . Diabetes Paternal Grandfather   . Colon polyps Paternal Uncle     Social History Social History   Tobacco Use  . Smoking status: Former Smoker    Packs/day: 1.00    Types: Cigarettes    Last attempt to quit: 12/21/2016    Years since quitting: 1.0  . Smokeless tobacco: Never Used  Substance Use Topics  . Alcohol use: No    Alcohol/week: 0.0 standard drinks  . Drug use: No    Comment: molly/THC     Allergies   Celery oil; Cymbalta [duloxetine hcl]; Lamictal [lamotrigine]; and Prozac [fluoxetine hcl]   Review of Systems Review of Systems  Constitutional: Positive for diaphoresis. Negative for chills, fatigue, fever and unexpected weight change.  HENT: Negative for congestion and rhinorrhea.   Eyes: Positive for visual disturbance (Pt reports seeing "dark spots" with episodes of lightheadedness.).  Respiratory: Positive for chest tightness and shortness of breath. Negative for cough and wheezing.   Cardiovascular: Positive for palpitations. Negative for chest pain and leg swelling.  Gastrointestinal: Positive for nausea. Negative for abdominal pain and vomiting.  Musculoskeletal: Positive for back pain and myalgias. Negative for neck pain.  Skin: Positive for color change (Pt reports feeling flushed with episodes of lightheadedness.). Negative for rash.  Allergic/Immunologic: Negative for immunocompromised state.  Neurological: Positive for light-headedness and headaches. Negative for dizziness, syncope (Pt reports presyncope, but states no  LOC.) and weakness.  Psychiatric/Behavioral: Negative for agitation and behavioral problems. The patient is not nervous/anxious.      Physical Exam Updated Vital Signs BP 116/86   Pulse 79   Resp 15   Ht 5\' 10"  (1.778 m)   Wt 133.8 kg   LMP 11/23/2017   SpO2 96%   BMI 42.33 kg/m   Physical Exam  Constitutional: She appears well-developed and well-nourished. No distress.  HENT:  Head: Normocephalic and atraumatic.  Eyes: Pupils are equal, round, and reactive to light.  Neck: Normal range of motion. Neck supple. No JVD present.  Cardiovascular: Normal rate, regular rhythm, normal heart sounds and normal pulses. Exam reveals no gallop and no friction rub.  No murmur heard. Pulses:      Radial pulses are 2+ on the right side, and 2+ on the left side.       Dorsalis pedis pulses are 2+ on the right  side, and 2+ on the left side.  Pulmonary/Chest: Effort normal and breath sounds normal. No respiratory distress. She has no wheezes. She has no rales. She exhibits no tenderness.  Abdominal: Soft. There is no tenderness.  Musculoskeletal: Normal range of motion.  Neurological: She is alert. She displays normal reflexes. No cranial nerve deficit or sensory deficit. Coordination normal.  Skin: Skin is warm. Capillary refill takes less than 2 seconds. No rash noted. She is not diaphoretic. No pallor.  Psychiatric: She has a normal mood and affect.  Nursing note and vitals reviewed.    ED Treatments / Results  Labs (all labs ordered are listed, but only abnormal results are displayed) Labs Reviewed  CBC WITH DIFFERENTIAL/PLATELET - Abnormal; Notable for the following components:      Result Value   WBC 12.4 (*)    Hemoglobin 15.1 (*)    HCT 46.5 (*)    Neutro Abs 9.3 (*)    All other components within normal limits  URINALYSIS, ROUTINE W REFLEX MICROSCOPIC - Abnormal; Notable for the following components:   Hgb urine dipstick SMALL (*)    Bacteria, UA RARE (*)    All other  components within normal limits  TSH  BASIC METABOLIC PANEL  CBG MONITORING, ED  I-STAT TROPONIN, ED  I-STAT BETA HCG BLOOD, ED ([facility], [facility], [facility] ONLY)  I-STAT CHEM 8, ED    EKG ED ECG REPORT   Date: 01/17/2018   Rate: 80  Rhythm: normal sinus rhythm  QRS Axis: normal  Intervals: normal  ST/T Wave abnormalities: normal  Conduction Disturbances:none  Narrative Interpretation: Normal ECG with nonspecific changes.  Radiology CT IMPRESSION:  Negative non contrasted CT appearance of the brain.   I have personally viewed and interpreted the imaging and agree with radiologist interpretation.  Procedures Procedures (including critical care time)  Medications Ordered in ED Medications - No data to display   Initial Impression / Assessment and Plan / ED Course  I have reviewed the triage vital signs and the nursing notes.  Pertinent labs & imaging results that were available during my care of the patient were reviewed by me and considered in my medical decision making (see chart for details).  Clinical Course as of Jan 17 1741  Mon Jan 17, 2018  1513 Leukocytosis noted.   CBC with Differential(!) [AH]  1546 Orthostatics are within normal limits.    [AH]    Clinical Course User Index [AH] Leretha Dykes, PA-C    DDX considered include, but are not limited to: arrhythmia, anemia, ruptured ectopic pregnancy, SAH, ACS, PE, seizure, TIA, metabolic disorder, intoxication, psychogenic pseudosyncope, vasovagal syncope, hyperventilation, carotid sinus syncope, trigeminal neuralgia, orthostatic syncope.  Patient is to be discharged with recommendation to follow up with PCP in regards to today's hospital visit. Chest tightness is not likely of cardiac or pulmonary etiology d/t presentation, PERC negative (score is 0), VSS, no JVD or new murmur, RRR, breath sounds equal bilaterally, EKG without acute abnormalities, and negative troponin. CT is also negative. Orthostatic vitals are within  normal limits. Pt has been advised to return to the ED if CP becomes exertional, associated with diaphoresis or nausea, radiates to left jaw/arm, worsens or becomes concerning in any way. Pt appears reliable for follow up and is agreeable to discharge.   Findings and plan of care discussed with supervising physician Dr. Hyacinth Meeker who personally evaluated and examined this patient.  Please obtain all of your results from medical records or have your doctors office  obtain the results - share them with your doctor - you should be seen at your doctors office in the next 2 days. Call today to arrange your follow up.  ?  You should return to the ER if you develop severe or worsening symptoms.      Final Clinical Impressions(s) / ED Diagnoses   Final diagnoses:  Nonspecific chest pain  Leukocytosis, unspecified type    ED Discharge Orders    None       Leretha Dykes, [place] 01/17/18 1749    Eber Hong, MD 01/18/18 [phone]

## 2018-01-17 NOTE — ED Notes (Signed)
Pt called out for pain medication, EDP made aware 

## 2018-09-19 ENCOUNTER — Ambulatory Visit (INDEPENDENT_AMBULATORY_CARE_PROVIDER_SITE_OTHER): Payer: Medicaid Other

## 2018-09-19 ENCOUNTER — Ambulatory Visit (HOSPITAL_COMMUNITY)
Admission: EM | Admit: 2018-09-19 | Discharge: 2018-09-19 | Disposition: A | Discharge status: Home / Self Care | Payer: Medicaid Other | Attending: Family Medicine | Admitting: Family Medicine

## 2018-09-19 ENCOUNTER — Encounter (HOSPITAL_COMMUNITY): Payer: Self-pay

## 2018-09-19 ENCOUNTER — Other Ambulatory Visit: Payer: Self-pay

## 2018-09-19 DIAGNOSIS — S93402A Sprain of unspecified ligament of left ankle, initial encounter: Secondary | ICD-10-CM

## 2018-09-19 NOTE — ED Triage Notes (Signed)
Patient presents to Urgent Care with complaints of left ankle pain since falling yesterday. Patient reports she was walking down a hill and thinks she hyper-extended her foot forwards.

## 2018-09-19 NOTE — Discharge Instructions (Signed)
Your x-ray did not show any fractures. 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 Make sure you rest, ice and elevate the ankle and foot Ibuprofen as needed for pain Follow up as needed for continued or worsening symptoms

## 2018-09-19 NOTE — ED Provider Notes (Signed)
[facility]    CSN: [id number] Arrival date & time: 09/19/18  1237     History   Chief Complaint Chief Complaint  Patient presents with  . Fall  . Ankle Pain    HPI Amber Nielsen is a 33 y.o. female.   Patient is a 33 year old female that presents today with left ankle pain, foot pain and swelling after twisting her ankle walking down a hill yesterday.  Symptoms have been constant, waxing waning.  Reporting that she hyperextended the foot forward.  She has been able to ambulate on the foot.  Last night she rested the foot and took some ibuprofen which helped the pain and swelling.  Reporting swelling is worse today but most likely due to her walking on the foot all day.  Denies any numbness, tingling or loss of sensation.  ROS per HPI      Past Medical History:  Diagnosis Date  . Acute pyelonephritis   . Anxiety   . Asthma    allergy related. rodent animals  . Bipolar 1 disorder (HCC)   . Depression   . H/O varicella   . Headache(784.0)   . History of gestational diabetes mellitus (GDM) 01/12/2017  . History of pre-eclampsia 01/12/2017  . HTN (hypertension)   . IBS (irritable bowel syndrome)   . Obesity   . Pregnancy induced hypertension   . Sepsis secondary to UTI (HCC) 08/26/2016  . Ureteral stone with hydronephrosis 08/27/2016    Patient Active Problem List   Diagnosis Date Noted  . Tobacco abuse 02/17/2017  . 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 Cannabis abuse 02/02/2016  . Morbid obesity (HCC) 12/15/2012    Past Surgical History:  Procedure Laterality Date  . COLONOSCOPY    . CYSTOSCOPY W/ URETERAL STENT PLACEMENT Left 08/26/2016   Procedure: CYSTOSCOPY WITH RETROGRADE PYELOGRAM/URETERAL STENT PLACEMENT;  Surgeon: [name], Stephen, MD;  [facility];  Service: Urology;  Laterality: Left;  . CYSTOSCOPY WITH RETROGRADE PYELOGRAM, URETEROSCOPY AND STENT PLACEMENT Left 09/14/2016   Procedure:  CYSTOSCOPY WITH URETEROSCOPY , STONE EXTRACTION AND STENT REMOVAL;  Surgeon: [name], Stephen, MD;  Location: [facility];  Service: Urology;  Laterality: Left;  . TOOTH EXTRACTION      OB History    Gravida  4   Para  2   Term  2   Preterm  0   AB  2   Living  2     SAB  2   TAB  0   Ectopic  0   Multiple  0   Live Births  2            Home Medications    Prior to Admission medications   Medication Sig Start Date End Date Taking? Authorizing Provider  albuterol (PROVENTIL HFA;VENTOLIN HFA) 108 (90 Base) MCG/ACT inhaler Inhale 2 puffs into the lungs every 6 (six) hours as needed for wheezing or shortness of breath.   Yes [provider]  citalopram (CELEXA) 20 MG tablet Take 1 tablet (20 mg total) daily by mouth. Patient taking differently: Take 30 mg by mouth at bedtime.  01/12/17  Yes Dove, Myra C, MD  Cholecalciferol (VITAMIN D3) 50 MCG (2000 UT) TABS Take 2,000 Units by mouth daily.    [provider]    Family History Family History  Problem Relation Age of Onset  . Heart disease Father   . Heart attack Father   . Nephrolithiasis Father   . Colon  polyps Father   . Healthy Mother   . Mental retardation Maternal Aunt   . Diabetes Maternal Grandmother   . Cancer Maternal Grandmother        ovarian and breast  . Diabetes Maternal Grandfather   . Diabetes Paternal Grandmother   . Hypertension Paternal Grandmother   . Heart disease Paternal Grandmother   . Colon polyps Paternal Grandmother   . Diabetes Paternal Grandfather   . Colon polyps Paternal Uncle     Social History Social History   Tobacco Use  . Smoking status: Current Every Day Smoker    Packs/day: 0.50    Types: Cigarettes  . Smokeless tobacco: Never Used  Substance Use Topics  . Alcohol use: No    Alcohol/week: 0.0 standard drinks  . Drug use: No    Comment: molly/THC     Allergies   Celery oil, Cymbalta [duloxetine hcl], Lamictal [lamotrigine], and Prozac  [fluoxetine hcl]   Review of Systems Review of Systems   Physical Exam Triage Vital Signs ED Triage Vitals  Enc Vitals Group     BP 09/19/18 1412 (!) 114/58     Pulse Rate 09/19/18 1412 76     Resp 09/19/18 1412 16     Temp 09/19/18 1412 98.6 F (37 C)     Temp Source 09/19/18 1412 Oral     SpO2 09/19/18 1412 100 %     Weight --      Height --      Head Circumference --      Peak Flow --      Pain Score 09/19/18 1410 5     Pain Loc --      Pain Edu? --      Excl. in GC? --    No data found.  Updated Vital Signs BP (!) 114/58 (BP Location: Right Arm)   Pulse 76   Temp 98.6 F (37 C) (Oral)   Resp 16   LMP 08/29/2018   SpO2 100%   Visual Acuity Right Eye Distance:   Left Eye Distance:   Bilateral Distance:    Right Eye Near:   Left Eye Near:    Bilateral Near:     Physical Exam Vitals signs and nursing note reviewed.  Constitutional:      Appearance: Normal appearance.  HENT:     Head: Normocephalic and atraumatic.     Nose: Nose normal.  Eyes:     Conjunctiva/sclera: Conjunctivae normal.  Neck:     Musculoskeletal: Normal range of motion.  Pulmonary:     Effort: Pulmonary effort is normal.  Musculoskeletal:        General: Swelling and tenderness present.     Left ankle: She exhibits swelling. She exhibits normal range of motion, no ecchymosis, no deformity, no laceration and normal pulse. Tenderness. Lateral malleolus tenderness found. Achilles tendon exhibits no pain and no defect.       Feet:  Skin:    General: Skin is warm and dry.  Neurological:     Mental Status: She is alert.      UC Treatments / Results  Labs (all labs ordered are listed, but only abnormal results are displayed) Labs Reviewed - No data to display  EKG   Radiology Dg Ankle Complete Left  Result Date: 09/19/2018 CLINICAL DATA:  Fall, pain EXAM: LEFT ANKLE COMPLETE - 3+ VIEW COMPARISON:  None. FINDINGS: No fracture or dislocation of the left ankle. Joint spaces  are well preserved. Moderate plantar calcaneal spur.  Diffuse soft tissue edema about the left ankle. IMPRESSION: No fracture or dislocation of the left ankle. Joint spaces are well preserved. Moderate plantar calcaneal spur. Diffuse soft tissue edema about the left ankle. Electronically Signed   By: Eddie Candle M.D.   On: 09/19/2018 14:34   Dg Foot Complete Left  Result Date: 09/19/2018 CLINICAL DATA:  Pain following fall EXAM: LEFT FOOT - COMPLETE 3+ VIEW COMPARISON:  None. FINDINGS: Frontal, oblique, and lateral views were obtained. There is no fracture or dislocation. Joint spaces appear normal. No erosive change. There is a spur arising from the inferior calcaneus. IMPRESSION: No fracture or dislocation. No evident arthropathy. There is an inferior calcaneal spur. Electronically Signed   By: Lowella Grip III M.D.   On: 09/19/2018 14:34    Procedures Procedures (including critical care time)  Medications Ordered in UC Medications - No data to display  Initial Impression / Assessment and Plan / UC Course  I have reviewed the triage vital signs and the nursing notes.  Pertinent labs & imaging results that were available during my care of the patient were reviewed by me and considered in my medical decision making (see chart for details).     No acute abnormalities noted on x-ray. Mostly likely ligamentous sprain. We will have her rest, ice, elevate and ibuprofen as needed for pain CAM Walker given here in clinic to wear at work Follow up as needed for continued or worsening symptoms  Final Clinical Impressions(s) / UC Diagnoses   Final diagnoses:  Sprain of left ankle, unspecified ligament, initial encounter     Discharge Instructions     Your x-ray did not show any fractures. 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 Make sure you rest, ice and elevate the ankle and foot Ibuprofen as needed for pain Follow up as  needed for continued or worsening symptoms     ED Prescriptions    None     Controlled Substance Prescriptions [place] Controlled Substance Registry consulted? Not Applicable   Orvan July, NP 09/19/18 1451

## 2018-09-27 ENCOUNTER — Encounter: Payer: Self-pay | Admitting: Sports Medicine

## 2018-09-27 ENCOUNTER — Other Ambulatory Visit: Payer: Self-pay

## 2018-09-27 ENCOUNTER — Ambulatory Visit: Payer: Medicaid Other

## 2018-09-27 ENCOUNTER — Ambulatory Visit: Payer: Medicaid Other | Admitting: Sports Medicine

## 2018-09-27 VITALS — BP 114/74 | HR 80 | Temp 97.7°F | Resp 16

## 2018-09-27 DIAGNOSIS — M7989 Other specified soft tissue disorders: Secondary | ICD-10-CM | POA: Diagnosis not present

## 2018-09-27 DIAGNOSIS — M7752 Other enthesopathy of left foot: Secondary | ICD-10-CM | POA: Diagnosis not present

## 2018-09-27 DIAGNOSIS — M79672 Pain in left foot: Secondary | ICD-10-CM | POA: Diagnosis not present

## 2018-09-27 DIAGNOSIS — S93602A Unspecified sprain of left foot, initial encounter: Secondary | ICD-10-CM | POA: Diagnosis not present

## 2018-09-27 DIAGNOSIS — S9002XA Contusion of left ankle, initial encounter: Secondary | ICD-10-CM

## 2018-09-27 NOTE — Progress Notes (Signed)
Subjective: Amber Nielsen is a 33 y.o. female patient who presents to office for evaluation of left foot pain. Patient complains of progressive pain especially over the last week that has slowly become more worse reports there is some stiffness and pain at the front of the ankle reports that she fell on last Sunday while walking down a hill and reports that she fell backward while her foot jammed and plantarflexed down as she slid down the heel states that she has been having pain over the top of the foot and ankle and at the lateral ankle and medial arch reports that the pain is worse with certain positions of her foot reports that she went to urgent care on [address] and had x-rays done and they advised her to rest ice elevate and to use her walking boot that she has and advised her that there was no fracture.  Patient reports that she still is concerned because there is some pain that seems like it is getting worse with increased swelling and that is the only reason why she decided to follow-up with a podiatrist versus going back to the urgent care.  Patient denies any other pedal complaints at this time.  Patient does admit to a previous history of ankle sprains especially on the left side in the past with history of weakness.  Review of Systems  Musculoskeletal: Positive for joint pain.  All other systems reviewed and are negative.    Patient Active Problem List   Diagnosis Date Noted  . Tobacco abuse 02/17/2017  . Major depressive disorder, recurrent severe without psychotic features ([place]) 02/02/2016  . Cannabis abuse 02/02/2016  . Morbid obesity ([place]) 12/15/2012    Current Outpatient Medications on File Prior to Visit  Medication Sig Dispense Refill  . albuterol (PROVENTIL HFA;VENTOLIN HFA) 108 (90 Base) MCG/ACT inhaler Inhale 2 puffs into the lungs every 6 (six) hours as needed for wheezing or shortness of breath.    . Cholecalciferol (VITAMIN D3) 50 MCG (2000 UT) TABS Take 2,000 Units by mouth  daily.    . citalopram (CELEXA) 20 MG tablet Take 1 tablet (20 mg total) daily by mouth. (Patient taking differently: Take 30 mg by mouth at bedtime. ) 30 tablet 2  . varenicline (CHANTIX PAK) 0.5 MG X 11 & 1 MG X 42 tablet Take by mouth daily. Take one 0.5 mg tablet by mouth once daily for 3 days, then increase to one 0.5 mg tablet twice daily for 4 days, then increase to one 1 mg tablet twice daily.     No current facility-administered medications on file prior to visit.     Allergies  Allergen Reactions  . Celery Oil Swelling    Tongue swelling when eating celery  . Cymbalta [Duloxetine Hcl] Other (See Comments)    "out of body"  . Lamictal [Lamotrigine] Other (See Comments)    unknown  . Prozac [Fluoxetine Hcl] Other (See Comments)    Anger problems    Objective:  General: Alert and oriented x3 in no acute distress  Dermatology: No open lesions bilateral lower extremities, no webspace macerations, no ecchymosis bilateral, all nails x 10 are well manicured.  Vascular: Dorsalis Pedis and Posterior Tibial pedal pulses palpable, Capillary Fill Time 3 seconds,(+) pedal hair growth bilateral, no edema bilateral lower extremities, Temperature gradient within normal limits.  Neurology: Johney Maine sensation intact via light touch bilateral.   Musculoskeletal: Mild tenderness with palpation at left foot especially over the extensor tendons and lateral ankle greater than  the medial arch with trace edema to the area pain is worsened with flexion and extension of toes and flexion and extension at ankle on left.  No clicking popping grinding upon manipulation of the joint there is mild ligamentous laxity upon stressing the left ankle but no frank instability..   Gait: Antalgic gait with cam boot  Assessment and Plan: Problem List Items Addressed This Visit    None    Visit Diagnoses    Sprain of left foot, initial encounter    -  Primary   Injury 09/18/2018   Tendinitis of left foot        Left foot pain       Swelling of left foot          -Complete examination performed -Xrays reviewed from urgent care revealing no fracture -Discussed treatment options for likely continued pain secondary to a severe sprain injury with swelling of foot -Applied Unna boot and advised patient to keep clean dry and intact for 5 days after removal may use compression sleeve to assist with keeping the swelling down to her left foot and ankle -Advised patient to continue with cam boot or may look for a different alternative on [employer] or over-the-counter -Patient to continue with rest ice elevation and over-the-counter pain relievers -Advised patient that it may take several weeks to recover from a sprain injury -Patient to return to office in 2 weeks or sooner if condition worsens.  Asencion Islamitorya Torey Regan, DPM

## 2018-10-04 ENCOUNTER — Ambulatory Visit: Payer: Medicaid Other | Admitting: Sports Medicine

## 2018-10-04 ENCOUNTER — Other Ambulatory Visit: Payer: Self-pay

## 2018-10-04 ENCOUNTER — Encounter: Payer: Self-pay | Admitting: Sports Medicine

## 2018-10-04 VITALS — Temp 97.6°F

## 2018-10-04 DIAGNOSIS — M7752 Other enthesopathy of left foot: Secondary | ICD-10-CM

## 2018-10-04 DIAGNOSIS — S93602D Unspecified sprain of left foot, subsequent encounter: Secondary | ICD-10-CM

## 2018-10-04 DIAGNOSIS — M79672 Pain in left foot: Secondary | ICD-10-CM

## 2018-10-04 NOTE — Progress Notes (Signed)
Subjective: Amber Nielsen is a [age] y.o. female patient who returns to the office for follow-up evaluation of left foot pain.  Patient reports that the pain over the top of her left foot is much better however there is a new pain along the side she can barely lay the side of her foot down on the bed due to the pain over the last few days reports that on yesterday she did do a lot of walking in a tennis shoe and afterwards had significant pain along the lateral side of her left foot.  Patient denies any redness warmth drainage bruising to the area.  Patient has tried rest and ice and elevation and ibuprofen which has helped some but is concerned about the pain with direct pressure to the area.  No other issues noted.  Patient denies any reinjury.   Patient Active Problem List   Diagnosis Date Noted  . Tobacco abuse 02/17/2017  . 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 Cannabis abuse 02/02/2016  . Morbid obesity (HCC) 12/15/2012    Current Outpatient Medications on File Prior to Visit  Medication Sig Dispense Refill  . albuterol (PROVENTIL HFA;VENTOLIN HFA) 108 (90 Base) MCG/ACT inhaler Inhale 2 puffs into the lungs every 6 (six) hours as needed for wheezing or shortness of breath.    . Cholecalciferol (VITAMIN D3) 50 MCG (2000 UT) TABS Take 2,000 Units by mouth daily.    . citalopram (CELEXA) 20 MG tablet Take 1 tablet (20 mg total) daily by mouth. (Patient taking differently: Take 30 mg by mouth at bedtime. ) 30 tablet 2  . varenicline (CHANTIX PAK) 0.5 MG X 11 & 1 MG X 42 tablet Take by mouth daily. Take one 0.5 mg tablet by mouth once daily for 3 days, then increase to one 0.5 mg tablet twice daily for 4 days, then increase to one 1 mg tablet twice daily.     No current facility-administered medications on file prior to visit.     Allergies  Allergen Reactions  . Celery Oil Swelling    Tongue swelling when eating celery  . Cymbalta  [Duloxetine Hcl] Other (See Comments)    "out of body"  . Lamictal [Lamotrigine] Other (See Comments)    unknown  . Prozac [Fluoxetine Hcl] Other (See Comments)    Anger problems    Objective:  General: Alert and oriented x3 in no acute distress  Dermatology: No open lesions bilateral lower extremities, no webspace macerations, no ecchymosis bilateral, all nails x 10 are well manicured.  Vascular: Dorsalis Pedis and Posterior Tibial pedal pulses palpable, Capillary Fill Time 3 seconds,(+) pedal hair growth bilateral, no edema bilateral lower extremities, Temperature gradient within normal limits.  Neurology: Michaell CowingGross sensation intact via light touch bilateral.   Musculoskeletal: Mild tenderness with palpation at left foot especially at the fifth metatarsal base at the peroneal brevis insertion.  There is no pain to palpation over the extensor tendons and lateral ankle greater than the medial arch.  There is no pain with flexion extension of toes.  No instability.  Gait: Antalgic gait with cam boot  Assessment and Plan: Problem List Items Addressed This Visit    None    Visit Diagnoses    Tendinitis of left foot    -  Primary   Sprain of left foot, subsequent encounter       Left foot pain          -Complete examination performed -Discussed treatment options  for likely continued pain secondary to a severe sprain injury with swelling of foot with tendinitis -Applied Unna boot and advised patient to keep clean dry and intact for 5 days after removal may use compression sleeve to assist with keeping the swelling down to her left foot and ankle and to allow the tendon pain to resolve and advised patient if continues to be a problem may benefit from steroid injection however patient denied and refused a injection at this visit -Advised patient to continue with cam boot or may look for a different alternative on [place] or over-the-counter that may be more controlling than the one that she  has -Patient to continue with rest ice elevation and over-the-counter pain relievers -Advised patient that it may take several weeks to recover from a sprain injury and tendinitis -Patient to return to office in next week as she already has an appointment or sooner if condition worsens.  Landis Martins, DPM

## 2018-10-11 ENCOUNTER — Encounter: Payer: Self-pay | Admitting: Sports Medicine

## 2018-10-11 ENCOUNTER — Other Ambulatory Visit: Payer: Self-pay

## 2018-10-11 ENCOUNTER — Ambulatory Visit: Payer: Medicaid Other | Admitting: Sports Medicine

## 2018-10-11 VITALS — Temp 97.3°F

## 2018-10-11 DIAGNOSIS — S93602D Unspecified sprain of left foot, subsequent encounter: Secondary | ICD-10-CM

## 2018-10-11 DIAGNOSIS — M7752 Other enthesopathy of left foot: Secondary | ICD-10-CM

## 2018-10-11 DIAGNOSIS — M79672 Pain in left foot: Secondary | ICD-10-CM

## 2018-10-11 DIAGNOSIS — M7989 Other specified soft tissue disorders: Secondary | ICD-10-CM

## 2018-10-11 NOTE — Progress Notes (Signed)
Subjective: Amber Nielsen is a 33 y.o. female patient who returns to the office for follow-up evaluation of left foot pain.  Patient reports that the pain on the side of the foot and the top is doing much better reports that the last 2 days she has been using tennis shoes and has been very careful when she walks at work and has been doing good with no residual pain or problems.  Patient reports that she likes using the compression sleeve it seems to offer her some support and states that she has ordered a postop shoe from [place] that she is awaiting and will also try using the shoe occasionally when she cannot fit into her normal tennis shoe with the compression sleeve on.  Patient reports that her brother also recommended a ankle brace of which she is going to buy to use when she is in tennis shoes.  Patient denies any increased pain redness warmth swelling or any other constitutional symptoms at this time.   Patient Active Problem List   Diagnosis Date Noted  . Tobacco abuse 02/17/2017  . Major depressive disorder, recurrent severe without psychotic features ([place]) 02/02/2016  . Cannabis abuse 02/02/2016  . Morbid obesity ([place]) 12/15/2012    Current Outpatient Medications on File Prior to Visit  Medication Sig Dispense Refill  . albuterol (PROVENTIL HFA;VENTOLIN HFA) 108 (90 Base) MCG/ACT inhaler Inhale 2 puffs into the lungs every 6 (six) hours as needed for wheezing or shortness of breath.    . Cholecalciferol (VITAMIN D3) 50 MCG (2000 UT) TABS Take 2,000 Units by mouth daily.    . citalopram (CELEXA) 20 MG tablet Take 1 tablet (20 mg total) daily by mouth. (Patient taking differently: Take 30 mg by mouth at bedtime. ) 30 tablet 2  . varenicline (CHANTIX PAK) 0.5 MG X 11 & 1 MG X 42 tablet Take by mouth daily. Take one 0.5 mg tablet by mouth once daily for 3 days, then increase to one 0.5 mg tablet twice daily for 4 days, then increase to one 1 mg tablet twice daily.     No current  facility-administered medications on file prior to visit.     Allergies  Allergen Reactions  . Celery Oil Swelling    Tongue swelling when eating celery  . Cymbalta [Duloxetine Hcl] Other (See Comments)    "out of body"  . Lamictal [Lamotrigine] Other (See Comments)    unknown  . Prozac [Fluoxetine Hcl] Other (See Comments)    Anger problems    Objective:  General: Alert and oriented x3 in no acute distress  Dermatology: No open lesions bilateral lower extremities, no webspace macerations, no ecchymosis bilateral, all nails x 10 are well manicured.  Vascular: Dorsalis Pedis and Posterior Tibial pedal pulses palpable, Capillary Fill Time 3 seconds,(+) pedal hair growth bilateral, no edema bilateral lower extremities, Temperature gradient within normal limits.  Neurology: Johney Maine sensation intact via light touch bilateral.   Musculoskeletal: Decreased tenderness with palpation at left foot especially at the fifth metatarsal base at the peroneal brevis insertion.  There is no pain to palpation over the extensor tendons and lateral ankle greater than the medial arch.  There is no pain with flexion extension of toes.  No instability.  Assessment and Plan: Problem List Items Addressed This Visit    None    Visit Diagnoses    Tendinitis of left foot    -  Primary   Sprain of left foot, subsequent encounter  Swelling of left foot       Left foot pain          -Complete examination performed -Patient is noted to be doing much better -Reviewed with patient long-term care and recovery for sprain and tendinitis -Advised patient to continue with compression sleeve and may slowly use tennis shoe or postop shoe as tolerated -advised patient slowly may transition to using a over-the-counter brace and may increase activities to her tolerance -Continue as needed with over-the-counter pain relievers -Patient to return to office as needed or sooner if condition worsens.  Asencion Islamitorya Angelo Prindle,  DPM

## 2018-12-20 IMAGING — CT CT HEAD W/O CM
4 series · 16 of 47 positions shown, 18 images · non-contrast
Comparison: None.

CLINICAL DATA: Migraine for 2 days.  Nausea.

EXAM:
CT HEAD WITHOUT CONTRAST
TECHNIQUE: Contiguous axial images were obtained from the base of the skull
through the vertex without intravenous contrast.

[Series 3: head without · axial · non-contrast · 0.45mm/px · z∈[-108,+12]mm · 7 of 32 slices shown, 9 images]
[im 4/32  brain]
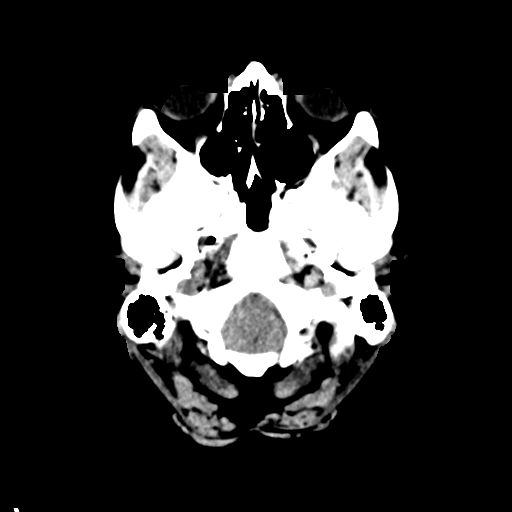
[im 4/32  bone]
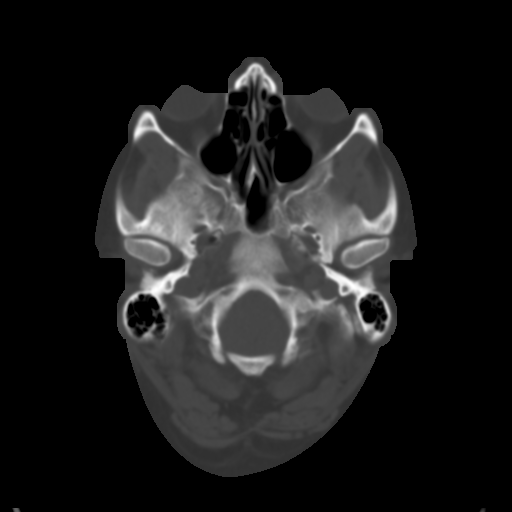
[im 8/32  brain]
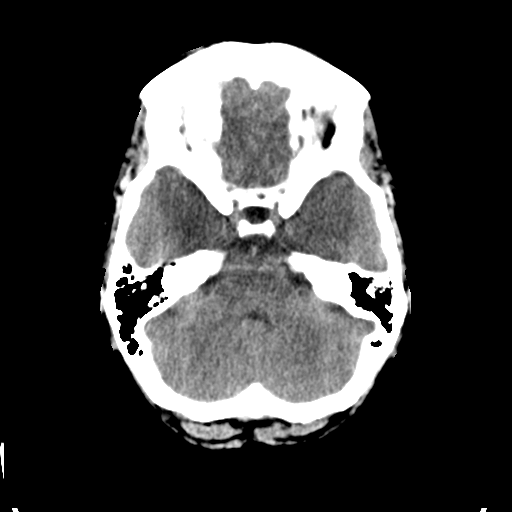
[im 12/32  brain]
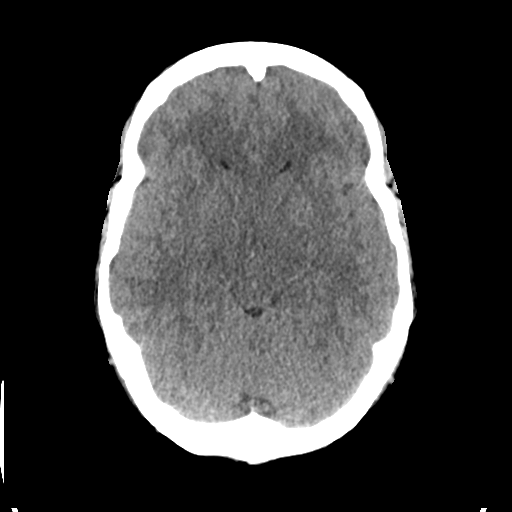
[im 16/32  brain]
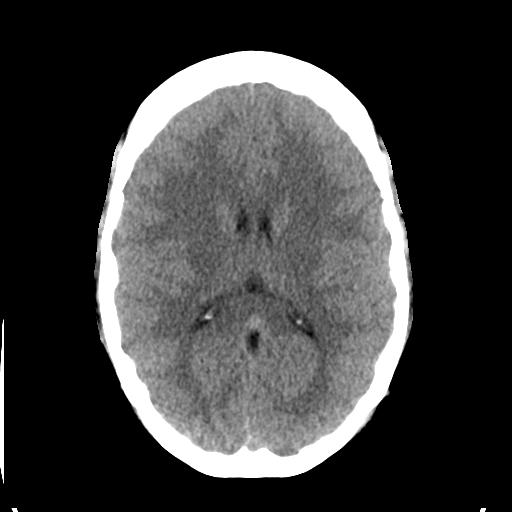
[im 20/32  brain]
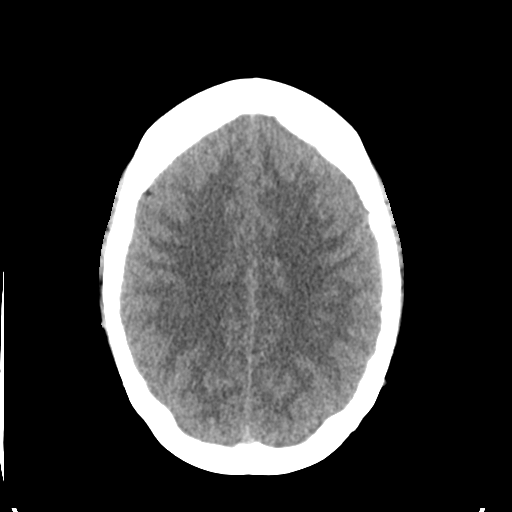
[im 20/32  bone]
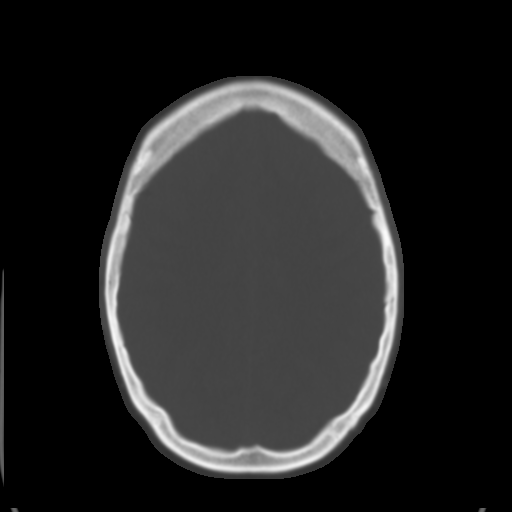
[im 24/32  brain]
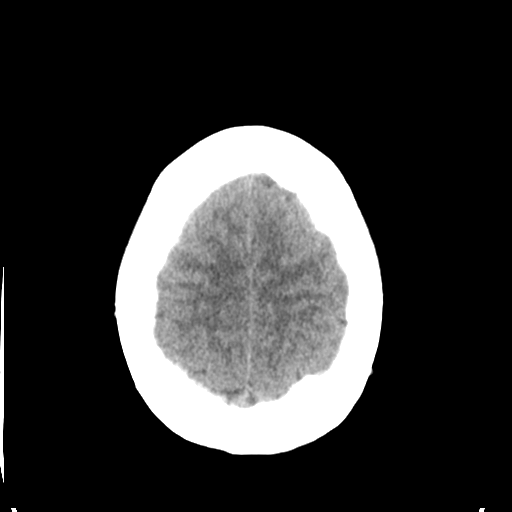
[im 28/32  brain]
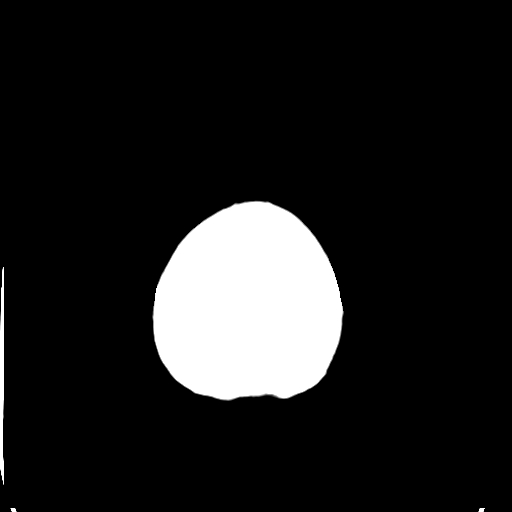

[Series 4: head bone · axial · 0.45mm/px · z∈[-109,-77]mm · 3 of 80 slices shown]
[im 8/80  bone]
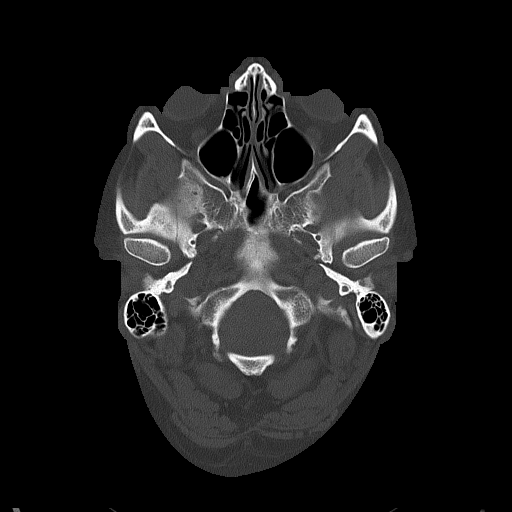
[im 16/80  bone]
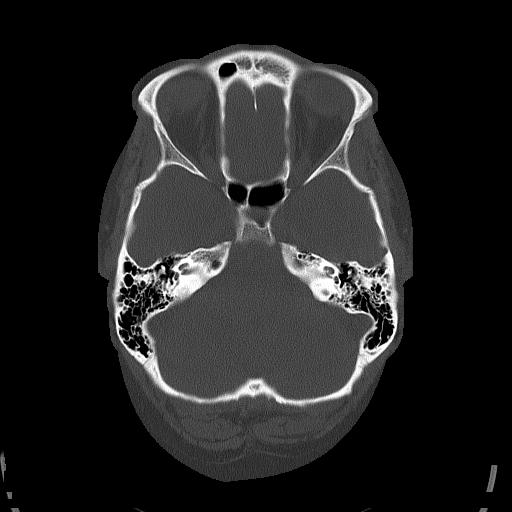
[im 24/80  bone]
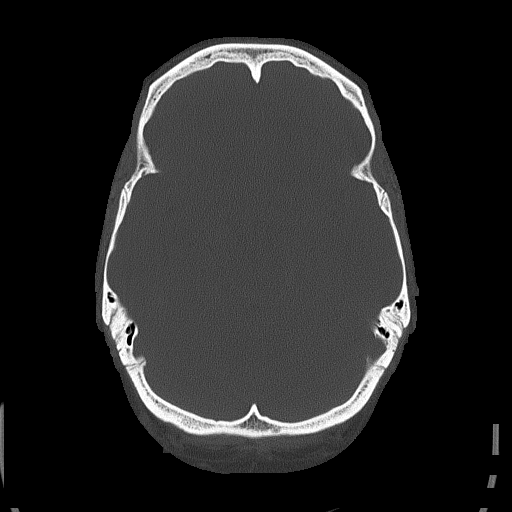

[Series 5: head without cor · coronal · non-contrast · 0.31mm/px · 3 of 73 slices shown]
[im 25/73  brain]
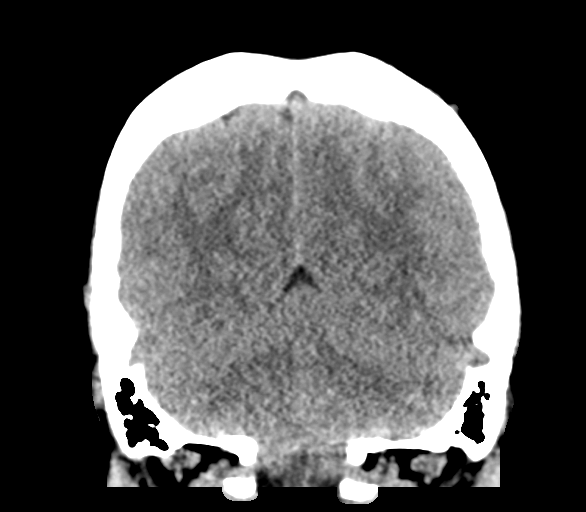
[im 33/73  brain]
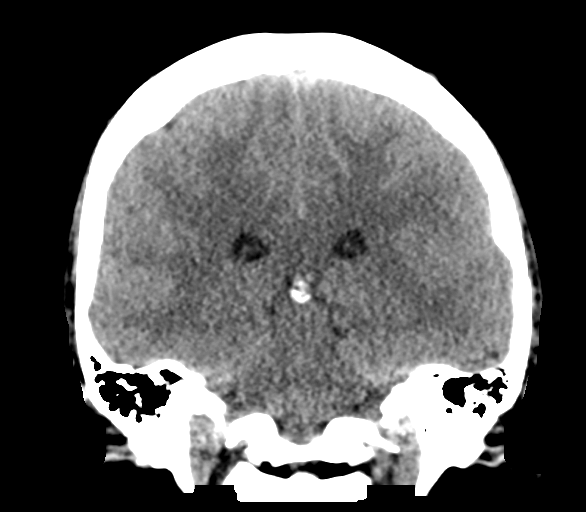
[im 41/73  brain]
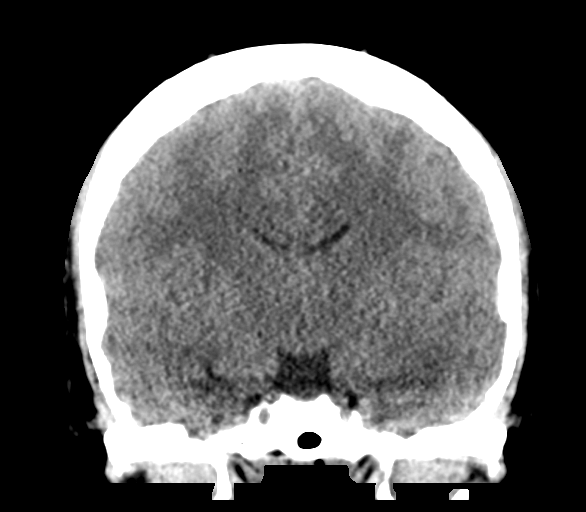

[Series 6: head without sag · sagittal · non-contrast · 0.31mm/px · 3 of 61 slices shown]
[im 21/61  brain]
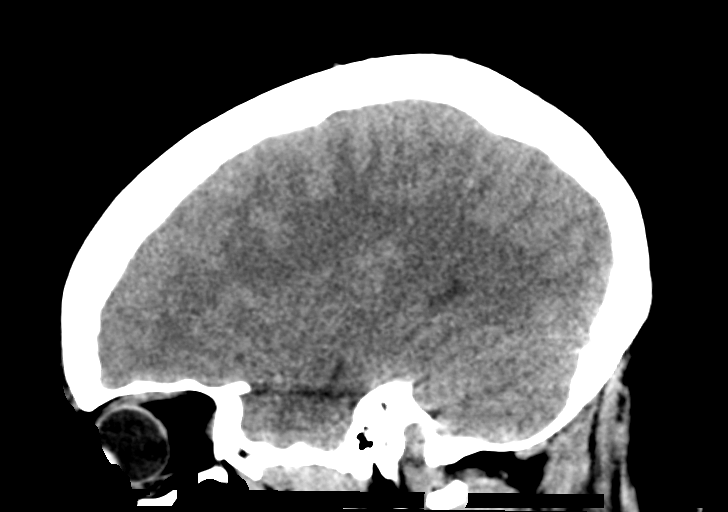
[im 31/61  brain]
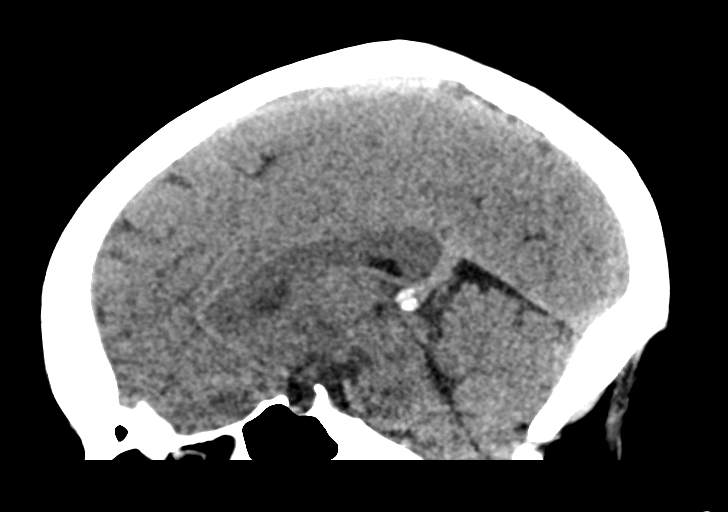
[im 41/61  brain]
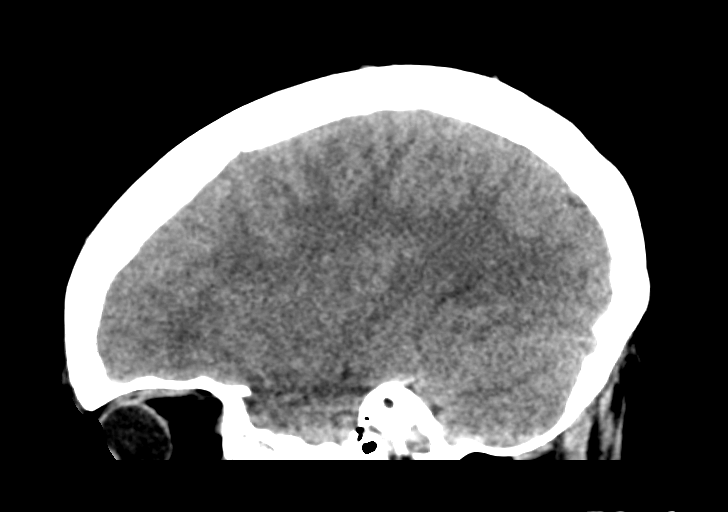

[16 of 47 positions shown; findings below may reference images not displayed]

FINDINGS: Brain: No acute intracranial abnormality. Specifically, no
hemorrhage, hydrocephalus, mass lesion, acute infarction, or
significant intracranial injury.

Vascular: No hyperdense vessel or unexpected calcification.

Skull: No acute calvarial abnormality.

Sinuses/Orbits: Visualized paranasal sinuses and mastoids clear.
Orbital soft tissues unremarkable.

Other: None
IMPRESSION: Normal study.

## 2019-05-26 ENCOUNTER — Other Ambulatory Visit: Payer: Self-pay | Admitting: Internal Medicine

## 2019-05-26 DIAGNOSIS — R1011 Right upper quadrant pain: Secondary | ICD-10-CM

## 2019-06-02 ENCOUNTER — Other Ambulatory Visit: Payer: Medicaid Other

## 2020-01-16 ENCOUNTER — Emergency Department
Admission: EM | Admit: 2020-01-16 | Discharge: 2020-01-16 | Disposition: A | Discharge status: Home / Self Care | Payer: Medicaid Other | Attending: Emergency Medicine | Admitting: Emergency Medicine

## 2020-01-16 ENCOUNTER — Other Ambulatory Visit: Payer: Self-pay

## 2020-01-16 ENCOUNTER — Encounter: Payer: Self-pay | Admitting: *Deleted

## 2020-01-16 DIAGNOSIS — S61011A Laceration without foreign body of right thumb without damage to nail, initial encounter: Secondary | ICD-10-CM

## 2020-01-16 DIAGNOSIS — I10 Essential (primary) hypertension: Secondary | ICD-10-CM | POA: Diagnosis not present

## 2020-01-16 DIAGNOSIS — J45909 Unspecified asthma, uncomplicated: Secondary | ICD-10-CM | POA: Diagnosis not present

## 2020-01-16 DIAGNOSIS — W25XXXA Contact with sharp glass, initial encounter: Secondary | ICD-10-CM | POA: Diagnosis not present

## 2020-01-16 DIAGNOSIS — F17210 Nicotine dependence, cigarettes, uncomplicated: Secondary | ICD-10-CM | POA: Diagnosis not present

## 2020-01-16 LAB — CBC WITH DIFFERENTIAL/PLATELET
Abs Immature Granulocytes: 0.02 10*3/uL (ref 0.00–0.07)
Basophils Absolute: 0.1 10*3/uL (ref 0.0–0.1)
Basophils Relative: 1 %
Eosinophils Absolute: 0.1 10*3/uL (ref 0.0–0.5)
Eosinophils Relative: 2 %
HCT: 36.7 % (ref 36.0–46.0)
Hemoglobin: 12.3 g/dL (ref 12.0–15.0)
Immature Granulocytes: 0 %
Lymphocytes Relative: 32 %
Lymphs Abs: 3 10*3/uL (ref 0.7–4.0)
MCH: 33.2 pg (ref 26.0–34.0)
MCHC: 33.5 g/dL (ref 30.0–36.0)
MCV: 98.9 fL (ref 80.0–100.0)
Monocytes Absolute: 0.7 10*3/uL (ref 0.1–1.0)
Monocytes Relative: 7 %
Neutro Abs: 5.5 10*3/uL (ref 1.7–7.7)
Neutrophils Relative %: 58 %
Platelets: 270 10*3/uL (ref 150–400)
RBC: 3.71 MIL/uL — ABNORMAL LOW (ref 3.87–5.11)
RDW: 12.5 % (ref 11.5–15.5)
WBC: 9.4 10*3/uL (ref 4.0–10.5)
nRBC: 0 % (ref 0.0–0.2)

## 2020-01-16 NOTE — ED Triage Notes (Signed)
Pt to ED reporting right hand laceration after dropping a glass while washing it. Bleeding under control and wound wrapped in triage.   Pt also reporting she has been feeling anemic again and requesting blood checked. Pt has a PCP blood draw scheduled for next week and Pt agreeable to plan to see MD to talk about blood draw.

## 2020-01-16 NOTE — ED Provider Notes (Signed)
[facility] Emergency Department Provider Note   ____________________________________________   First MD Initiated Contact with Patient 01/16/20 0205     (approximate)  I have reviewed the triage vital signs and the nursing notes.   HISTORY  Chief Complaint Laceration    HPI Amber Nielsen is a 34 y.o. female with a stated past medical history of bipolar disorder, asthma, and hypertension who presents for a right thumb laceration after a glass broke when she was attempting to clean it.  Patient states that she ran it under cool water right after it happened and then wrapped it in a paper towel.  Patient denies any pulsatile bleeding.  Patient denies any distal numbness/weakness/paresthesias/color change.  Patient states that she had a tetanus booster within the last year         Past Medical History:  Diagnosis Date  . Acute pyelonephritis   . Anxiety   . Asthma    allergy related. rodent animals  . Bipolar 1 disorder (HCC)   . Depression   . H/O varicella   . Headache(784.0)   . History of gestational diabetes mellitus (GDM) 01/12/2017  . History of pre-eclampsia 01/12/2017  . HTN (hypertension)   . IBS (irritable bowel syndrome)   . Obesity   . Pregnancy induced hypertension   . Sepsis secondary to UTI (HCC) 08/26/2016  . Ureteral stone with hydronephrosis 08/27/2016    Patient Active Problem List   Diagnosis Date Noted  . Tobacco abuse 02/17/2017  . 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 Cannabis abuse 02/02/2016  . Morbid obesity (HCC) 12/15/2012    Past Surgical History:  Procedure Laterality Date  . COLONOSCOPY    . CYSTOSCOPY W/ URETERAL STENT PLACEMENT Left 08/26/2016   Procedure: CYSTOSCOPY WITH RETROGRADE PYELOGRAM/URETERAL STENT PLACEMENT;  Surgeon: Marcine Matar, MD;  Location: [facility] OR;  Service: Urology;  Laterality: Left;  . CYSTOSCOPY WITH RETROGRADE PYELOGRAM,  URETEROSCOPY AND STENT PLACEMENT Left 09/14/2016   Procedure: CYSTOSCOPY WITH URETEROSCOPY , STONE EXTRACTION AND STENT REMOVAL;  Surgeon: Marcine Matar, MD;  Location: [facility] ORS;  Service: Urology;  Laterality: Left;  . TOOTH EXTRACTION      Prior to Admission medications   Medication Sig Start Date End Date Taking? Authorizing Provider  albuterol (PROVENTIL HFA;VENTOLIN HFA) 108 (90 Base) MCG/ACT inhaler Inhale 2 puffs into the lungs every 6 (six) hours as needed for wheezing or shortness of breath.    [provider]  Cholecalciferol (VITAMIN D3) 50 MCG (2000 UT) TABS Take 2,000 Units by mouth daily.    [provider]  citalopram (CELEXA) 20 MG tablet Take 1 tablet (20 mg total) daily by mouth. Patient taking differently: Take 30 mg by mouth at bedtime.  01/12/17   Allie Bossier, MD  varenicline (CHANTIX PAK) 0.5 MG X 11 & 1 MG X 42 tablet Take by mouth daily. Take one 0.5 mg tablet by mouth once daily for 3 days, then increase to one 0.5 mg tablet twice daily for 4 days, then increase to one 1 mg tablet twice daily.    [provider]    Allergies Celery oil, Cymbalta [duloxetine hcl], Lamictal [lamotrigine], and Prozac [fluoxetine hcl]  Family History  Problem Relation Age of Onset  . Heart disease Father   . Heart attack Father   . Nephrolithiasis Father   . Colon polyps Father   . Healthy Mother   . Mental retardation Maternal Aunt   . Diabetes Maternal  Grandmother   . Cancer Maternal Grandmother        ovarian and breast  . Diabetes Maternal Grandfather   . Diabetes Paternal Grandmother   . Hypertension Paternal Grandmother   . Heart disease Paternal Grandmother   . Colon polyps Paternal Grandmother   . Diabetes Paternal Grandfather   . Colon polyps Paternal Uncle     Social History Social History   Tobacco Use  . Smoking status: Current Every Day Smoker    Packs/day: 0.50    Types: Cigarettes  . Smokeless tobacco: Never Used  Vaping Use    . Vaping Use: Never used  Substance Use Topics  . Alcohol use: No    Alcohol/week: 0.0 standard drinks  . Drug use: No    Comment: molly/THC    Review of Systems Constitutional: No fever/chills Eyes: No visual changes. ENT: No sore throat. Cardiovascular: Denies chest pain. Respiratory: Denies shortness of breath. Gastrointestinal: No abdominal pain.  No nausea, no vomiting.  No diarrhea. Genitourinary: Negative for dysuria. Musculoskeletal: Positive for acute arthralgia of the right thumb Skin: Negative for rash. Neurological: Negative for headaches, weakness/numbness/paresthesias in any extremity Psychiatric: Negative for suicidal ideation/homicidal ideation   ____________________________________________   PHYSICAL EXAM:  VITAL SIGNS: ED Triage Vitals  Enc Vitals Group     BP 01/16/20 0036 110/69     Pulse Rate 01/16/20 0036 99     Resp 01/16/20 0036 16     Temp 01/16/20 0036 99 F (37.2 C)     Temp Source 01/16/20 0036 Oral     SpO2 01/16/20 0036 100 %     Weight 01/16/20 0037 155 lb (70.3 kg)     Height 01/16/20 0037 5\' 10"  (1.778 m)     Head Circumference --      Peak Flow --      Pain Score 01/16/20 0042 4     Pain Loc --      Pain Edu? --      Excl. in GC? --    Constitutional: Alert and oriented. Well appearing and in no acute distress. Eyes: Conjunctivae are normal. PERRL. Head: Atraumatic. Nose: No congestion/rhinnorhea. Mouth/Throat: Mucous membranes are moist. Neck: No stridor Cardiovascular: Grossly normal heart sounds.  Good peripheral circulation. Respiratory: Normal respiratory effort.  No retractions. Gastrointestinal: Soft and nontender. No distention. Musculoskeletal: No obvious deformities Neurologic:  Normal speech and language. No gross focal neurologic deficits are appreciated. Skin:  Skin is warm and dry. No rash noted.  Right thumb with 1 cm curvilinear superficial laceration just distal to the MTP joint Psychiatric: Mood and affect  are normal. Speech and behavior are normal.  ____________________________________________   LABS (all labs ordered are listed, but only abnormal results are displayed)  Labs Reviewed  CBC WITH DIFFERENTIAL/PLATELET - Abnormal; Notable for the following components:      Result Value   RBC 3.71 (*)    All other components within normal limits    PROCEDURES  Procedure(s) performed (including Critical Care):  Procedures   ____________________________________________   INITIAL IMPRESSION / ASSESSMENT AND PLAN / ED COURSE  As part of my medical decision making, I reviewed the following data within the electronic MEDICAL RECORD NUMBER Nursing notes reviewed and incorporated, Labs reviewed, Old chart reviewed and Notes from prior ED visits reviewed and incorporated        Patient had a laceration that was cleaned and dressed in the ED after copious irrigation.  After exploration of the wound, there was no evidence  of a retained foreign body. No evidence of underlying fracture. TDAP: UTD Interventions: Defer ABX at this time given location, event time, and patient without surrounding signs of infection. Disposition: Discharge. Patient has been given strict wound return precautions and instructions to follow up with their PMD in 2 days for a wound recheck.      ____________________________________________   FINAL CLINICAL IMPRESSION(S) / ED DIAGNOSES  Final diagnoses:  Laceration of right thumb without foreign body without damage to nail, initial encounter     ED Discharge Orders    None       Note:  This document was prepared using Dragon voice recognition software and may include unintentional dictation errors.   Merwyn Katos, MD 01/16/20 [phone]

## 2020-01-16 NOTE — ED Notes (Signed)
No e signature pad in room. Pt educated on discharge instructions and verbalized understanding.

## 2020-02-06 ENCOUNTER — Ambulatory Visit (HOSPITAL_COMMUNITY): Payer: Medicaid Other | Admitting: Psychiatry

## 2020-05-25 ENCOUNTER — Emergency Department (HOSPITAL_COMMUNITY): Payer: Medicaid Other

## 2020-05-25 ENCOUNTER — Encounter (HOSPITAL_COMMUNITY): Payer: Self-pay | Admitting: Emergency Medicine

## 2020-05-25 ENCOUNTER — Emergency Department (HOSPITAL_COMMUNITY)
Admission: EM | Admit: 2020-05-25 | Discharge: 2020-05-25 | Disposition: A | Discharge status: Home / Self Care | Payer: Medicaid Other | Attending: Emergency Medicine | Admitting: Emergency Medicine

## 2020-05-25 DIAGNOSIS — F17210 Nicotine dependence, cigarettes, uncomplicated: Secondary | ICD-10-CM | POA: Insufficient documentation

## 2020-05-25 DIAGNOSIS — R1010 Upper abdominal pain, unspecified: Secondary | ICD-10-CM | POA: Insufficient documentation

## 2020-05-25 DIAGNOSIS — R109 Unspecified abdominal pain: Secondary | ICD-10-CM

## 2020-05-25 DIAGNOSIS — I10 Essential (primary) hypertension: Secondary | ICD-10-CM | POA: Diagnosis not present

## 2020-05-25 DIAGNOSIS — J45909 Unspecified asthma, uncomplicated: Secondary | ICD-10-CM | POA: Diagnosis not present

## 2020-05-25 LAB — URINALYSIS, ROUTINE W REFLEX MICROSCOPIC
Bacteria, UA: NONE SEEN
Bilirubin Urine: NEGATIVE
Glucose, UA: NEGATIVE mg/dL
Hgb urine dipstick: NEGATIVE
Ketones, ur: NEGATIVE mg/dL
Nitrite: NEGATIVE
Protein, ur: NEGATIVE mg/dL
Specific Gravity, Urine: 1.032 — ABNORMAL HIGH (ref 1.005–1.030)
pH: 7 (ref 5.0–8.0)

## 2020-05-25 LAB — COMPREHENSIVE METABOLIC PANEL
ALT: 23 U/L (ref 0–44)
AST: 22 U/L (ref 15–41)
Albumin: 4.1 g/dL (ref 3.5–5.0)
Alkaline Phosphatase: 47 U/L (ref 38–126)
Anion gap: 7 (ref 5–15)
BUN: 10 mg/dL (ref 6–20)
CO2: 28 mmol/L (ref 22–32)
Calcium: 9.5 mg/dL (ref 8.9–10.3)
Chloride: 103 mmol/L (ref 98–111)
Creatinine, Ser: 0.64 mg/dL (ref 0.44–1.00)
GFR, Estimated: >60 mL/min (ref >60)
Glucose, Bld: 69 mg/dL — ABNORMAL LOW (ref 70–99)
Potassium: 4.8 mmol/L (ref 3.5–5.1)
Sodium: 138 mmol/L (ref 135–145)
Total Bilirubin: 1 mg/dL (ref 0.3–1.2)
Total Protein: 7.6 g/dL (ref 6.5–8.1)

## 2020-05-25 LAB — CBC
HCT: 45.9 % (ref 36.0–46.0)
Hemoglobin: 15.2 g/dL — ABNORMAL HIGH (ref 12.0–15.0)
MCH: 33.3 pg (ref 26.0–34.0)
MCHC: 33.1 g/dL (ref 30.0–36.0)
MCV: 100.4 fL — ABNORMAL HIGH (ref 80.0–100.0)
Platelets: 297 10*3/uL (ref 150–400)
RBC: 4.57 MIL/uL (ref 3.87–5.11)
RDW: 11.8 % (ref 11.5–15.5)
WBC: 6.8 10*3/uL (ref 4.0–10.5)
nRBC: 0 % (ref 0.0–0.2)

## 2020-05-25 LAB — I-STAT BETA HCG BLOOD, ED (MC, WL, AP ONLY): I-stat hCG, quantitative: <5 m[IU]/mL (ref <5)

## 2020-05-25 LAB — LIPASE, BLOOD: Lipase: 35 U/L (ref 11–51)

## 2020-05-25 MED ORDER — SODIUM CHLORIDE 0.9 % IV BOLUS
1000.0000 mL | Freq: Once | INTRAVENOUS | Status: AC
  Administered 2020-05-25: 1000 mL via INTRAVENOUS

## 2020-05-25 MED ORDER — MORPHINE SULFATE (PF) 4 MG/ML IV SOLN
4.0000 mg | Freq: Once | INTRAVENOUS | Status: AC
  Administered 2020-05-25: 4 mg via INTRAVENOUS
  Filled 2020-05-25: qty 1

## 2020-05-25 MED ORDER — SUCRALFATE 1 G PO TABS: 1.0000 g | ORAL_TABLET | Freq: Three times a day (TID) | ORAL | Status: DC

## 2020-05-25 MED ORDER — ONDANSETRON HCL 4 MG/2ML IJ SOLN
4.0000 mg | Freq: Once | INTRAMUSCULAR | Status: AC
  Administered 2020-05-25: 4 mg via INTRAVENOUS
  Filled 2020-05-25: qty 2

## 2020-05-25 MED ORDER — IOHEXOL 300 MG/ML  SOLN
100.0000 mL | Freq: Once | INTRAMUSCULAR | Status: AC | PRN
  Administered 2020-05-25: 100 mL via INTRAVENOUS

## 2020-05-25 NOTE — ED Provider Notes (Signed)
[facility] EMERGENCY DEPARTMENT Provider Note   CSN: [id number] Arrival date & time: 05/25/20  1113     History Chief Complaint  Patient presents with  . Abdominal Pain    Amber Nielsen is a 35 y.o. female.  Patient c/o pain mid to upper abdomen onset yesterday. Symptoms acute onset, moderate, dull, non radiating, constant. No specific exacerbating or alleviating factors. Occurs at rest. No relation to eating, or activity/exertion. No vomiting. No abd distension. Had loose bm this AM x 1. No dysuria or hematuria. Remote hx gastric bypass surgery and cholecystectomy. No flank/back pain. No fever or chills. No vaginal discharge or bleeding.   The history is provided by the patient.  Abdominal Pain Associated symptoms: no chest pain, no dysuria, no fever, no shortness of breath and no vomiting        Past Medical History:  Diagnosis Date  . Acute pyelonephritis   . Anxiety   . Asthma    allergy related. rodent animals  . Bipolar 1 disorder (HCC)   . Depression   . H/O varicella   . Headache(784.0)   . History of gestational diabetes mellitus (GDM) 01/12/2017  . History of pre-eclampsia 01/12/2017  . HTN (hypertension)   . IBS (irritable bowel syndrome)   . Obesity   . Pregnancy induced hypertension   . Sepsis secondary to UTI (HCC) 08/26/2016  . Ureteral stone with hydronephrosis 08/27/2016    Patient Active Problem List   Diagnosis Date Noted  . Tobacco abuse 02/17/2017  . 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 Cannabis abuse 02/02/2016  . Morbid obesity (HCC) 12/15/2012    Past Surgical History:  Procedure Laterality Date  . COLONOSCOPY    . CYSTOSCOPY W/ URETERAL STENT PLACEMENT Left 08/26/2016   Procedure: CYSTOSCOPY WITH RETROGRADE PYELOGRAM/URETERAL STENT PLACEMENT;  Surgeon: Marcine Matar, MD;  Location: [facility] OR;  Service: Urology;  Laterality: Left;  . CYSTOSCOPY WITH RETROGRADE  PYELOGRAM, URETEROSCOPY AND STENT PLACEMENT Left 09/14/2016   Procedure: CYSTOSCOPY WITH URETEROSCOPY , STONE EXTRACTION AND STENT REMOVAL;  Surgeon: Marcine Matar, MD;  Location: [facility] ORS;  Service: Urology;  Laterality: Left;  Marland Kitchen GASTRIC BYPASS    . TOOTH EXTRACTION       OB History    Gravida  4   Para  2   Term  2   Preterm  0   AB  2   Living  2     SAB  2   IAB  0   Ectopic  0   Multiple  0   Live Births  2           Family History  Problem Relation Age of Onset  . Heart disease Father   . Heart attack Father   . Nephrolithiasis Father   . Colon polyps Father   . Healthy Mother   . Mental retardation Maternal Aunt   . Diabetes Maternal Grandmother   . Cancer Maternal Grandmother        ovarian and breast  . Diabetes Maternal Grandfather   . Diabetes Paternal Grandmother   . Hypertension Paternal Grandmother   . Heart disease Paternal Grandmother   . Colon polyps Paternal Grandmother   . Diabetes Paternal Grandfather   . Colon polyps Paternal Uncle     Social History   Tobacco Use  . Smoking status: Current Every Day Smoker    Packs/day: 0.50    Types: Cigarettes  . Smokeless tobacco: Never  Used  Vaping Use  . Vaping Use: Never used  Substance Use Topics  . Alcohol use: No    Alcohol/week: 0.0 standard drinks  . Drug use: No    Comment: molly/THC    Home Medications Prior to Admission medications   Medication Sig Start Date End Date Taking? Authorizing Provider  albuterol (PROVENTIL HFA;VENTOLIN HFA) 108 (90 Base) MCG/ACT inhaler Inhale 2 puffs into the lungs every 6 (six) hours as needed for wheezing or shortness of breath.    [provider]  Cholecalciferol (VITAMIN D3) 50 MCG (2000 UT) TABS Take 2,000 Units by mouth daily.    [provider]  citalopram (CELEXA) 20 MG tablet Take 1 tablet (20 mg total) daily by mouth. Patient taking differently: Take 30 mg by mouth at bedtime.  01/12/17   Allie Bossier, MD   varenicline (CHANTIX PAK) 0.5 MG X 11 & 1 MG X 42 tablet Take by mouth daily. Take one 0.5 mg tablet by mouth once daily for 3 days, then increase to one 0.5 mg tablet twice daily for 4 days, then increase to one 1 mg tablet twice daily.    [provider]    Allergies    Celery oil, Cymbalta [duloxetine hcl], Lamictal [lamotrigine], and Prozac [fluoxetine hcl]  Review of Systems   Review of Systems  Constitutional: Negative for fever.  Eyes: Negative for redness.  Respiratory: Negative for shortness of breath.   Cardiovascular: Negative for chest pain.  Gastrointestinal: Positive for abdominal pain. Negative for vomiting.  Genitourinary: Negative for dysuria and flank pain.  Musculoskeletal: Negative for back pain.  Skin: Negative for rash.  Neurological: Negative for headaches.  Hematological: Does not bruise/bleed easily.  Psychiatric/Behavioral: Negative for confusion.    Physical Exam Updated Vital Signs BP 108/74   Pulse 76   Temp 98 F (36.7 C) (Oral)   Resp 18   LMP 05/11/2020   SpO2 100%   Physical Exam Vitals and nursing note reviewed.  Constitutional:      Appearance: Normal appearance. She is well-developed.  HENT:     Head: Atraumatic.     Nose: Nose normal.     Mouth/Throat:     Mouth: Mucous membranes are moist.  Eyes:     General: No scleral icterus.    Conjunctiva/sclera: Conjunctivae normal.  Neck:     Trachea: No tracheal deviation.  Cardiovascular:     Rate and Rhythm: Normal rate and regular rhythm.     Pulses: Normal pulses.     Heart sounds: Normal heart sounds. No murmur heard. No friction rub. No gallop.   Pulmonary:     Effort: Pulmonary effort is normal. No respiratory distress.     Breath sounds: Normal breath sounds.  Abdominal:     General: Bowel sounds are normal. There is no distension.     Palpations: Abdomen is soft. There is no mass.     Tenderness: There is abdominal tenderness. There is no guarding.     Hernia:  No hernia is present.     Comments: +mid and upper abd tenderness.   Genitourinary:    Comments: No cva tenderness.  Musculoskeletal:        General: No swelling.     Cervical back: Normal range of motion and neck supple. No rigidity. No muscular tenderness.  Skin:    General: Skin is warm and dry.     Findings: No rash.  Neurological:     Mental Status: She is alert.  Comments: Alert, speech normal.   Psychiatric:        Mood and Affect: Mood normal.     ED Results / Procedures / Treatments   Labs (all labs ordered are listed, but only abnormal results are displayed) Results for orders placed or performed during the hospital encounter of 05/25/20  Lipase, blood  Result Value Ref Range   Lipase 35 11 - 51 U/L  Comprehensive metabolic panel  Result Value Ref Range   Sodium 138 135 - 145 mmol/L   Potassium 4.8 3.5 - 5.1 mmol/L   Chloride 103 98 - 111 mmol/L   CO2 28 22 - 32 mmol/L   Glucose, Bld 69 (L) 70 - 99 mg/dL   BUN 10 6 - 20 mg/dL   Creatinine, Ser 5.17 0.44 - 1.00 mg/dL   Calcium 9.5 8.9 - 00.1 mg/dL   Total Protein 7.6 6.5 - 8.1 g/dL   Albumin 4.1 3.5 - 5.0 g/dL   AST 22 15 - 41 U/L   ALT 23 0 - 44 U/L   Alkaline Phosphatase 47 38 - 126 U/L   Total Bilirubin 1.0 0.3 - 1.2 mg/dL   GFR, Estimated >74 >94 mL/min   Anion gap 7 5 - 15  CBC  Result Value Ref Range   WBC 6.8 4.0 - 10.5 K/uL   RBC 4.57 3.87 - 5.11 MIL/uL   Hemoglobin 15.2 (H) 12.0 - 15.0 g/dL   HCT 49.6 75.9 - 16.3 %   MCV 100.4 (H) 80.0 - 100.0 fL   MCH 33.3 26.0 - 34.0 pg   MCHC 33.1 30.0 - 36.0 g/dL   RDW 84.6 65.9 - 93.5 %   Platelets 297 150 - 400 K/uL   nRBC 0.0 0.0 - 0.2 %  I-Stat beta hCG blood, ED  Result Value Ref Range   I-stat hCG, quantitative <5.0 <5 mIU/mL   Comment 3            EKG None  Radiology No results found.  Procedures Procedures   Medications Ordered in ED Medications - No data to display  ED Course  I have reviewed the triage vital signs and the  nursing notes.  Pertinent labs & imaging results that were available during my care of the patient were reviewed by me and considered in my medical decision making (see chart for details).    MDM Rules/Calculators/A&P                         Iv ns. Stat labs. Imaging.  Reviewed nursing notes and prior charts for additional history.   Labs reviewed/interpreted by me - lipase normal. preg neg.   CT pending.   1515, ct pending - signed out to Dr Freida Busman to check ct, recheck pt, and dispo appropriately.    Final Clinical Impression(s) / ED Diagnoses Final diagnoses:  None    Rx / DC Orders ED Discharge Orders    None       Cathren Laine, MD 05/25/20 1516

## 2020-05-25 NOTE — ED Notes (Signed)
Patient transported to CT 

## 2020-05-25 NOTE — ED Provider Notes (Signed)
Patient's abdominal CT without acute pathology.  Reading was noted and patient's LFTs are normal.  Patient states that she had similar symptoms before associated with gastritis.  We will add Carafate to her regimen.   Lorre Nick, MD 05/25/20 [phone]

## 2020-05-25 NOTE — ED Triage Notes (Signed)
Pt reports upper abd pain that radiates to back since yesterday with nausea and diarrhea.  Reports dysuria today.

## 2020-06-11 NOTE — Unmapped (Signed)
Called patient to reschedule her 4/27 appointment with Dr. Abran Cantor. Patient doesn't have a voicemail set up so I couldn't leave a message. I sent an email with new appointment date of 07/31/20.

## 2020-07-03 ENCOUNTER — Ambulatory Visit

## 2020-07-31 ENCOUNTER — Ambulatory Visit

## 2020-08-09 ENCOUNTER — Encounter (HOSPITAL_COMMUNITY): Payer: Self-pay | Admitting: Emergency Medicine

## 2020-08-09 ENCOUNTER — Ambulatory Visit (HOSPITAL_COMMUNITY)
Admission: EM | Admit: 2020-08-09 | Discharge: 2020-08-09 | Disposition: A | Discharge status: Home / Self Care | Payer: Medicaid Other | Attending: Physician Assistant | Admitting: Physician Assistant

## 2020-08-09 ENCOUNTER — Other Ambulatory Visit: Payer: Self-pay

## 2020-08-09 DIAGNOSIS — J020 Streptococcal pharyngitis: Secondary | ICD-10-CM | POA: Diagnosis not present

## 2020-08-09 DIAGNOSIS — J029 Acute pharyngitis, unspecified: Secondary | ICD-10-CM

## 2020-08-09 LAB — POCT RAPID STREP A, ED / UC: Streptococcus, Group A Screen (Direct): POSITIVE — AB

## 2020-08-09 MED ORDER — FLUCONAZOLE 150 MG PO TABS: 150.0000 mg | ORAL_TABLET | Freq: Once | ORAL | 0 refills | Status: AC

## 2020-08-09 MED ORDER — AMOXICILLIN 500 MG PO CAPS: 500.0000 mg | ORAL_CAPSULE | Freq: Two times a day (BID) | ORAL | 0 refills | Status: DC

## 2020-08-09 NOTE — ED Triage Notes (Signed)
Reports sore throat for 1 day. Woke up this morning with chills and sore throat, reports white patchy throat. Diarrhea x3 today.

## 2020-08-09 NOTE — Discharge Instructions (Addendum)
You tested positive for strep.  Please take amoxicillin twice a day for 10 days.  You are contagious until you have been on antibiotics for 24 hours please remain at home.  Antibiotics will often give you a yeast infection and if develop any of these symptoms please take Diflucan.  Make sure to throw away your toothbrush after you have been on antibiotics for 24 to 48 hours to prevent reinfection.  If you have any worsening symptoms including fever, trouble swallowing, trouble speaking, shortness of breath you need to go to the emergency room.

## 2020-08-09 NOTE — ED Provider Notes (Signed)
[facility]    CSN: [id number] Arrival date & time: 08/09/20  6387      History   Chief Complaint Chief Complaint  Patient presents with  . Sore Throat    HPI Amber Nielsen is a 35 y.o. female.   Patient presents today with a 2-day history of worsening sore throat.  She reports associated subjective fever and chills as well as increased fatigue.  Denies any congestion, cough, shortness of breath, nausea, vomiting, muffled voice, inability to swallow.  She denies any known sick contacts.  She does have a history of strep pharyngitis and states current symptoms are similar to previous episodes of this condition.  She has not been followed by ENT or had tonsillectomy in the past.  Denies any recent antibiotic use.  She has tried over-the-counter analgesics with minimal improvement of symptoms.  Reports pain is rated 6 on a 0-10 pain scale, localized to posterior oropharynx without radiation, described as sharp, worse with swallowing, no alleviating factors identified.       Past Medical History:  Diagnosis Date  . Acute pyelonephritis   . Anxiety   . Asthma    allergy related. rodent animals  . Bipolar 1 disorder (HCC)   . Depression   . H/O varicella   . Headache(784.0)   . History of gestational diabetes mellitus (GDM) 01/12/2017  . History of pre-eclampsia 01/12/2017  . HTN (hypertension)   . IBS (irritable bowel syndrome)   . Obesity   . Pregnancy induced hypertension   . Sepsis secondary to UTI (HCC) 08/26/2016  . Ureteral stone with hydronephrosis 08/27/2016    Patient Active Problem List   Diagnosis Date Noted  . Tobacco abuse 02/17/2017  . 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 Cannabis abuse 02/02/2016  . Morbid obesity (HCC) 12/15/2012    Past Surgical History:  Procedure Laterality Date  . COLONOSCOPY    . CYSTOSCOPY W/ URETERAL STENT PLACEMENT Left 08/26/2016   Procedure: CYSTOSCOPY WITH  RETROGRADE PYELOGRAM/URETERAL STENT PLACEMENT;  Surgeon: Marcine Matar, MD;  Location: [facility] OR;  Service: Urology;  Laterality: Left;  . CYSTOSCOPY WITH RETROGRADE PYELOGRAM, URETEROSCOPY AND STENT PLACEMENT Left 09/14/2016   Procedure: CYSTOSCOPY WITH URETEROSCOPY , STONE EXTRACTION AND STENT REMOVAL;  Surgeon: Marcine Matar, MD;  Location: [facility] ORS;  Service: Urology;  Laterality: Left;  Marland Kitchen GASTRIC BYPASS    . TOOTH EXTRACTION      OB History    Gravida  4   Para  2   Term  2   Preterm  0   AB  2   Living  2     SAB  2   IAB  0   Ectopic  0   Multiple  0   Live Births  2            Home Medications    Prior to Admission medications   Medication Sig Start Date End Date Taking? Authorizing Provider  amoxicillin (AMOXIL) 500 MG capsule Take 1 capsule (500 mg total) by mouth 2 (two) times daily. 08/09/20  Yes Elanora Quin, Noberto Retort, PA-C  Calcium-Vitamin D-Vitamin K (CALCIUM + D) [phone] MG-UNT-MCG CHEW See admin instructions.   Yes [provider]  citalopram (CELEXA) 20 MG tablet Take 1 tablet (20 mg total) daily by mouth. Patient taking differently: Take 30 mg by mouth at bedtime. 01/12/17  Yes Dove, Myra C, MD  fluconazole (DIFLUCAN) 150 MG tablet Take 1 tablet (150 mg total) by  mouth once for 1 dose. 08/09/20 08/09/20 Yes Jalaine Riggenbach, Noberto RetortErin K, PA-C  Multiple Vitamin (MULTIVITAMIN) tablet Take 1 tablet by mouth daily.   Yes [provider]  omeprazole (PRILOSEC) 40 MG capsule Take 40 mg by mouth daily. 07/20/19  Yes [provider]  albuterol (PROVENTIL HFA;VENTOLIN HFA) 108 (90 Base) MCG/ACT inhaler Inhale 2 puffs into the lungs every 6 (six) hours as needed for wheezing or shortness of breath.    [provider]  levonorgestrel (MIRENA, 52 MG,) 20 MCG/24HR IUD 1 each by Intrauterine route continuous.    [provider]  Vitamin D, Ergocalciferol, (DRISDOL) 1.25 MG (50000 UNIT) CAPS capsule Take 50,000 Units by mouth every 7 (seven)  days.    [provider]    Family History Family History  Problem Relation Age of Onset  . Heart disease Father   . Heart attack Father   . Nephrolithiasis Father   . Colon polyps Father   . Healthy Mother   . Mental retardation Maternal Aunt   . Diabetes Maternal Grandmother   . Cancer Maternal Grandmother        ovarian and breast  . Diabetes Maternal Grandfather   . Diabetes Paternal Grandmother   . Hypertension Paternal Grandmother   . Heart disease Paternal Grandmother   . Colon polyps Paternal Grandmother   . Diabetes Paternal Grandfather   . Colon polyps Paternal Uncle     Social History Social History   Tobacco Use  . Smoking status: Current Every Day Smoker    Packs/day: 0.50    Types: Cigarettes  . Smokeless tobacco: Never Used  Vaping Use  . Vaping Use: Never used  Substance Use Topics  . Alcohol use: No    Alcohol/week: 0.0 standard drinks  . Drug use: No    Comment: molly/THC     Allergies   Metformin and related, Celery oil, Cymbalta [duloxetine hcl], Lamictal [lamotrigine], and Prozac [fluoxetine hcl]   Review of Systems Review of Systems  Constitutional: Positive for activity change, fatigue and fever. Negative for appetite change.  HENT: Positive for sore throat and trouble swallowing (pain). Negative for congestion, sinus pressure and sneezing.   Respiratory: Negative for cough and shortness of breath.   Cardiovascular: Negative for chest pain.  Gastrointestinal: Negative for abdominal pain, diarrhea, nausea and vomiting.  Musculoskeletal: Negative for arthralgias and myalgias.  Neurological: Negative for dizziness, light-headedness and headaches.     Physical Exam Triage Vital Signs ED Triage Vitals  Enc Vitals Group     BP 08/09/20 0834 128/70     Pulse Rate 08/09/20 0834 78     Resp 08/09/20 0834 16     Temp 08/09/20 0834 97.9 F (36.6 C)     Temp Source 08/09/20 0834 Oral     SpO2 08/09/20 0834 100 %     Weight --       Height --      Head Circumference --      Peak Flow --      Pain Score 08/09/20 0838 6     Pain Loc --      Pain Edu? --      Excl. in GC? --    No data found.  Updated Vital Signs BP 128/70   Pulse 78   Temp 97.9 F (36.6 C) (Oral)   Resp 16   SpO2 100%   Visual Acuity Right Eye Distance:   Left Eye Distance:   Bilateral Distance:    Right Eye  Near:   Left Eye Near:    Bilateral Near:     Physical Exam Vitals reviewed.  Constitutional:      General: She is awake. She is not in acute distress.    Appearance: Normal appearance. She is normal weight. She is not ill-appearing.     Comments: Very pleasant female appears stated age in no acute distress  HENT:     Head: Normocephalic and atraumatic.     Right Ear: Tympanic membrane, ear canal and external ear normal. Tympanic membrane is not erythematous or bulging.     Left Ear: Tympanic membrane, ear canal and external ear normal. There is impacted cerumen. Tympanic membrane is not erythematous or bulging.     Ears:     Comments: Left ear: Ear canal partially occluded with cerumen.  Able to visualize approximately 30% of TM that appears normal.    Nose:     Right Sinus: No maxillary sinus tenderness or frontal sinus tenderness.     Left Sinus: No maxillary sinus tenderness or frontal sinus tenderness.     Mouth/Throat:     Pharynx: Uvula midline. Posterior oropharyngeal erythema present. No oropharyngeal exudate.     Tonsils: Tonsillar exudate present. No tonsillar abscesses. 2+ on the right. 2+ on the left.     Comments: No evidence of tonsillar abscess on exam Cardiovascular:     Rate and Rhythm: Normal rate and regular rhythm.     Heart sounds: Normal heart sounds. No murmur heard.   Pulmonary:     Effort: Pulmonary effort is normal.     Breath sounds: Normal breath sounds. No wheezing, rhonchi or rales.     Comments: Clear to auscultation bilaterally Lymphadenopathy:     Head:     Right side of head:  Tonsillar adenopathy present. No submental or submandibular adenopathy.     Left side of head: Tonsillar adenopathy present. No submental or submandibular adenopathy.     Cervical: No cervical adenopathy.     Right cervical: No superficial cervical adenopathy.    Left cervical: No superficial cervical adenopathy.  Psychiatric:        Behavior: Behavior is cooperative.      UC Treatments / Results  Labs (all labs ordered are listed, but only abnormal results are displayed) Labs Reviewed  POCT RAPID STREP A, ED / UC - Abnormal; Notable for the following components:      Result Value   Streptococcus, Group A Screen (Direct) POSITIVE (*)    All other components within normal limits    EKG   Radiology No results found.  Procedures Procedures (including critical care time)  Medications Ordered in UC Medications - No data to display  Initial Impression / Assessment and Plan / UC Course  I have reviewed the triage vital signs and the nursing notes.  Pertinent labs & imaging results that were available during my care of the patient were reviewed by me and considered in my medical decision making (see chart for details).     Center score of 3.  Rapid strep positive in office today.  Patient started on amoxicillin twice daily for 10 days.  She was given Diflucan should she develop any yeast infection symptoms.  She can alternate Tylenol and ibuprofen for pain relief.  She was provided work excuse note as requested.  Discussed that she should change toothbrush 24 to 48 hours after being on antibiotics to prevent reinfection.  Discussed that she is contagious for 24 hours after starting  antibiotics.  Discussed alarm symptoms that warrant emergent evaluation.  Strict return precautions given to which patient expressed understanding.  Final Clinical Impressions(s) / UC Diagnoses   Final diagnoses:  Strep pharyngitis  Sore throat     Discharge Instructions     You tested positive  for strep.  Please take amoxicillin twice a day for 10 days.  You are contagious until you have been on antibiotics for 24 hours please remain at home.  Antibiotics will often give you a yeast infection and if develop any of these symptoms please take Diflucan.  Make sure to throw away your toothbrush after you have been on antibiotics for 24 to 48 hours to prevent reinfection.  If you have any worsening symptoms including fever, trouble swallowing, trouble speaking, shortness of breath you need to go to the emergency room.    ED Prescriptions    Medication Sig Dispense Auth. Provider   amoxicillin (AMOXIL) 500 MG capsule Take 1 capsule (500 mg total) by mouth 2 (two) times daily. 20 capsule Cozetta Seif K, PA-C   fluconazole (DIFLUCAN) 150 MG tablet Take 1 tablet (150 mg total) by mouth once for 1 dose. 1 tablet Zyanne Schumm, Noberto Retort, PA-C     PDMP not reviewed this encounter.   Jeani Hawking, PA-C 08/09/20 [id number]

## 2020-08-13 ENCOUNTER — Encounter (HOSPITAL_COMMUNITY): Payer: Self-pay

## 2020-08-13 ENCOUNTER — Ambulatory Visit (HOSPITAL_COMMUNITY)
Admission: EM | Admit: 2020-08-13 | Discharge: 2020-08-13 | Disposition: A | Discharge status: Home / Self Care | Payer: Medicaid Other | Attending: Urgent Care | Admitting: Urgent Care

## 2020-08-13 ENCOUNTER — Other Ambulatory Visit: Payer: Self-pay

## 2020-08-13 DIAGNOSIS — J020 Streptococcal pharyngitis: Secondary | ICD-10-CM | POA: Diagnosis not present

## 2020-08-13 DIAGNOSIS — J029 Acute pharyngitis, unspecified: Secondary | ICD-10-CM

## 2020-08-13 MED ORDER — CETIRIZINE HCL 10 MG PO TABS: 10.0000 mg | ORAL_TABLET | Freq: Every day | ORAL | 0 refills | Status: AC

## 2020-08-13 MED ORDER — PSEUDOEPHEDRINE HCL 60 MG PO TABS: 60.0000 mg | ORAL_TABLET | Freq: Three times a day (TID) | ORAL | 0 refills | Status: DC | PRN

## 2020-08-13 MED ORDER — CLINDAMYCIN HCL 300 MG PO CAPS: 300.0000 mg | ORAL_CAPSULE | Freq: Three times a day (TID) | ORAL | 0 refills | Status: DC

## 2020-08-13 NOTE — ED Provider Notes (Signed)
[facility]   MRN: [id number] DOB: 13-Nov-1985  Subjective:   Amber Nielsen is a 35 y.o. female presenting for recheck on her strep infection. Was last seen 4 days ago, started on amoxicillin for strep. Has not improved and is slightly worse. Cannot take NSAIDs due to having had gastric bypass.   No current facility-administered medications for this encounter.  Current Outpatient Medications:  .  albuterol (PROVENTIL HFA;VENTOLIN HFA) 108 (90 Base) MCG/ACT inhaler, Inhale 2 puffs into the lungs every 6 (six) hours as needed for wheezing or shortness of breath., Disp: , Rfl:  .  amoxicillin (AMOXIL) 500 MG capsule, Take 1 capsule (500 mg total) by mouth 2 (two) times daily., Disp: 20 capsule, Rfl: 0 .  Calcium-Vitamin D-Vitamin K (CALCIUM + D) [phone] MG-UNT-MCG CHEW, See admin instructions., Disp: , Rfl:  .  citalopram (CELEXA) 20 MG tablet, Take 1 tablet (20 mg total) daily by mouth. (Patient taking differently: Take 30 mg by mouth at bedtime.), Disp: 30 tablet, Rfl: 2 .  levonorgestrel (MIRENA, 52 MG,) 20 MCG/24HR IUD, 1 each by Intrauterine route continuous., Disp: , Rfl:  .  Multiple Vitamin (MULTIVITAMIN) tablet, Take 1 tablet by mouth daily., Disp: , Rfl:  .  omeprazole (PRILOSEC) 40 MG capsule, Take 40 mg by mouth daily., Disp: , Rfl:  .  Vitamin D, Ergocalciferol, (DRISDOL) 1.25 MG (50000 UNIT) CAPS capsule, Take 50,000 Units by mouth every 7 (seven) days., Disp: , Rfl:    Allergies  Allergen Reactions  . Metformin And Related Diarrhea and Nausea And Vomiting    Other reaction(s): diarrhea  . Celery Oil Swelling    Tongue swelling when eating celery  . Cymbalta [Duloxetine Hcl] Other (See Comments)    "out of body"  . Lamictal [Lamotrigine] Other (See Comments)    unknown  . Prozac [Fluoxetine Hcl] Other (See Comments)    Anger problems    Past Medical History:  Diagnosis Date  . Acute pyelonephritis   . Anxiety   . Asthma    allergy related.  rodent animals  . Bipolar 1 disorder (HCC)   . Depression   . H/O varicella   . Headache(784.0)   . History of gestational diabetes mellitus (GDM) 01/12/2017  . History of pre-eclampsia 01/12/2017  . HTN (hypertension)   . IBS (irritable bowel syndrome)   . Obesity   . Pregnancy induced hypertension   . Sepsis secondary to UTI (HCC) 08/26/2016  . Ureteral stone with hydronephrosis 08/27/2016     Past Surgical History:  Procedure Laterality Date  . COLONOSCOPY    . CYSTOSCOPY W/ URETERAL STENT PLACEMENT Left 08/26/2016   Procedure: CYSTOSCOPY WITH RETROGRADE PYELOGRAM/URETERAL STENT PLACEMENT;  Surgeon: Marcine Matar, MD;  Location: [facility] OR;  Service: Urology;  Laterality: Left;  . CYSTOSCOPY WITH RETROGRADE PYELOGRAM, URETEROSCOPY AND STENT PLACEMENT Left 09/14/2016   Procedure: CYSTOSCOPY WITH URETEROSCOPY , STONE EXTRACTION AND STENT REMOVAL;  Surgeon: Marcine Matar, MD;  Location: [facility];  Service: Urology;  Laterality: Left;  Marland Kitchen GASTRIC BYPASS    . TOOTH EXTRACTION      Family History  Problem Relation Age of Onset  . Heart disease Father   . Heart attack Father   . Nephrolithiasis Father   . Colon polyps Father   . Healthy Mother   . Mental retardation Maternal Aunt   . Diabetes Maternal Grandmother   . Cancer Maternal Grandmother        ovarian and breast  . Diabetes Maternal Grandfather   .  Diabetes Paternal Grandmother   . Hypertension Paternal Grandmother   . Heart disease Paternal Grandmother   . Colon polyps Paternal Grandmother   . Diabetes Paternal Grandfather   . Colon polyps Paternal Uncle     Social History   Tobacco Use  . Smoking status: Current Every Day Smoker    Packs/day: 0.50    Types: Cigarettes  . Smokeless tobacco: Never Used  Vaping Use  . Vaping Use: Never used  Substance Use Topics  . Alcohol use: No    Alcohol/week: 0.0 standard drinks  . Drug use: No    Comment: molly/THC    ROS   Objective:   Vitals: BP (!) 101/59 (BP  Location: Right Arm)   Pulse 74   Temp 98.2 F (36.8 C) (Oral)   Resp 17   SpO2 100%   Physical Exam Constitutional:      General: Amber Nielsen is not in acute distress.    Appearance: Normal appearance. Amber Nielsen is well-developed. Amber Nielsen is not ill-appearing.  HENT:     Head: Normocephalic and atraumatic.     Nose: Nose normal.     Mouth/Throat:     Mouth: Mucous membranes are moist.     Pharynx: Oropharyngeal exudate and posterior oropharyngeal erythema present. No pharyngeal swelling or uvula swelling.  Eyes:     General: No scleral icterus.    Extraocular Movements: Extraocular movements intact.     Pupils: Pupils are equal, round, and reactive to light.  Cardiovascular:     Rate and Rhythm: Normal rate.  Pulmonary:     Effort: Pulmonary effort is normal.  Skin:    General: Skin is warm and dry.  Neurological:     General: No focal deficit present.     Mental Status: Amber Nielsen is alert and oriented to person, place, and time.  Psychiatric:        Mood and Affect: Mood normal.        Behavior: Behavior normal.      Assessment and Plan :   PDMP not reviewed this encounter.  1. Acute streptococcal pharyngitis   2. Sore throat     Will switch to clindamycin, start ibuprofen low dose as Amber Nielsen tolerates this. Also use Zyrtec and Sudafed to address any post-nasal drainage contributing to her symptoms. Counseled patient on potential for adverse effects with medications prescribed/recommended today, ER and return-to-clinic precautions discussed, patient verbalized understanding.    Wallis Bamberg, [place] 08/13/20 2038

## 2020-08-13 NOTE — ED Triage Notes (Signed)
Pt presents with ongoing sore throat for over 4 days that is progressing since starting antibiotics on 08/09/20 for strep.

## 2020-09-30 ENCOUNTER — Encounter (HOSPITAL_COMMUNITY): Payer: Self-pay | Admitting: Licensed Clinical Social Worker

## 2020-09-30 ENCOUNTER — Other Ambulatory Visit: Payer: Self-pay

## 2020-09-30 ENCOUNTER — Ambulatory Visit (INDEPENDENT_AMBULATORY_CARE_PROVIDER_SITE_OTHER): Payer: Medicaid Other | Admitting: Licensed Clinical Social Worker

## 2020-09-30 DIAGNOSIS — F3181 Bipolar II disorder: Secondary | ICD-10-CM
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
## 2020-10-25 ENCOUNTER — Other Ambulatory Visit: Payer: Self-pay

## 2020-10-25 ENCOUNTER — Ambulatory Visit (INDEPENDENT_AMBULATORY_CARE_PROVIDER_SITE_OTHER): Payer: Medicaid Other | Admitting: Physician Assistant

## 2020-10-25 ENCOUNTER — Encounter (HOSPITAL_COMMUNITY): Payer: Self-pay | Admitting: Physician Assistant

## 2020-10-25 VITALS — BP 107/71 | HR 72 | Ht 70.0 in | Wt 152.0 lb

## 2020-10-25 DIAGNOSIS — F39 Unspecified mood [affective] disorder: Secondary | ICD-10-CM

## 2020-10-25 MED ORDER — LAMOTRIGINE 25 MG PO TABS: 25.0000 mg | ORAL_TABLET | Freq: Every day | ORAL | 0 refills | Status: DC

## 2020-10-25 MED ORDER — LAMOTRIGINE 25 MG PO TABS: 50.0000 mg | ORAL_TABLET | Freq: Every day | ORAL | 1 refills | Status: DC
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
## 2020-11-18 ENCOUNTER — Ambulatory Visit (HOSPITAL_COMMUNITY): Payer: Medicaid Other | Admitting: Licensed Clinical Social Worker

## 2020-11-25 ENCOUNTER — Ambulatory Visit (HOSPITAL_COMMUNITY): Payer: Medicaid Other | Admitting: Licensed Clinical Social Worker

## 2020-11-25 ENCOUNTER — Other Ambulatory Visit: Payer: Self-pay

## 2020-11-25 DIAGNOSIS — F3181 Bipolar II disorder: Secondary | ICD-10-CM

## 2020-11-25 NOTE — Plan of Care (Signed)
Pt agreeable to plan  ?

## 2020-11-25 NOTE — Progress Notes (Signed)
   THERAPIST PROGRESS NOTE  Session Time: 86   Participation Level: Active  Behavioral Response: CasualAlertEuthymic  Type of Therapy: Individual Therapy  Treatment Goals addressed: Diagnosis: depression   Interventions: CBT, Supportive, and Reframing  Summary: Amber Nielsen is a 35 y.o. female who presents with euthymic mood\affect.  Patient was pleasant and cooperative and engaged well in therapy.  Amber Nielsen was alert and oriented x5.  Patient was dressed casually and maintained good eye contact.  Patient comes in today with primary stressors of work and family conflict.  Patient was last seen in July and was planning to follow-up with medication management.  Patient had a therapy appointment with Marybelle Killings but had to cancel it due to a conflict of interest her significant other was seeing this therapist as well.  Patient presents today for walk-in appointment.  Primary goal is to establish treatment plan on and to schedule out 2 therapy sessions with this LCSW.   Patient reports stressors at work she states that she has problems reading and understanding communication.  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  She has been told on more than 1 occasion that her conversations can be inappropriate for "chair talk".  LCSW asked for an example patient reports speaking to female customers about menstrual cycles and creating conflict with other colleagues customers in front of the customer.  Patient also reports family conflict.  She states one of the goals\objectives that she wants to work on is with her family history in regards to her father.  Patient reports resentment towards her father for raising her to girl children.  Amber Nielsen reports primary goal is to establish better relationship with her dad and continue to build on the relationship  Suicidal/Homicidal: NAwithout intent/plan  Therapist Response:    Intervention/Plan: Plan for patient is to establish with this LCSW.   Treatment plan was updated and agreeable by patient.  Interventions utilized in today's session were supportive therapy for gait encouragement and praise.  Psychoanalytic therapy was used for association to let patient freely talk about feelings, emotions, and mood.  Plan for patient is to follow-up with LCSW in 3 weeks  Plan: Return again in 4 weeks.      Weber Cooks, LCSW 11/25/2020

## 2020-12-13 ENCOUNTER — Other Ambulatory Visit: Payer: Self-pay

## 2020-12-13 ENCOUNTER — Telehealth (HOSPITAL_COMMUNITY): Payer: Medicaid Other | Admitting: Physician Assistant

## 2020-12-18 ENCOUNTER — Other Ambulatory Visit: Payer: Self-pay

## 2020-12-18 ENCOUNTER — Ambulatory Visit (INDEPENDENT_AMBULATORY_CARE_PROVIDER_SITE_OTHER): Payer: Medicaid Other | Admitting: Licensed Clinical Social Worker

## 2020-12-18 DIAGNOSIS — F3181 Bipolar II disorder: Secondary | ICD-10-CM | POA: Diagnosis not present

## 2020-12-18 NOTE — Progress Notes (Signed)
THERAPIST PROGRESS NOTE  Session Time: 86  Virtual Visit via Video Note  I connected with Amber Nielsen on 12/18/20 at 11:00 AM EDT by a video enabled telemedicine application and verified that I am speaking with the correct person using two identifiers.  Location: Patient: [place] Provider: [facility]    I discussed the limitations of evaluation and management by telemedicine and the availability of in person appointments. The patient expressed understanding and agreed to proceed.     I discussed the assessment and treatment plan with the patient. The patient was provided an opportunity to ask questions and all were answered. The patient agreed with the plan and demonstrated an understanding of the instructions.   The patient was advised to call back or seek an in-person evaluation if the symptoms worsen or if the condition fails to improve as anticipated.  I provided 40 minutes of non-face-to-face time during this encounter.   Amber Cooks, LCSW   Participation Level: Active  Behavioral Response: CasualAlertAnxious and Depressed  Type of Therapy: Individual Therapy  Treatment Goals addressed: bipolar 2 disorder depressive type.   Interventions: CBT and Supportive  Summary: Amber Nielsen is a 35 y.o. female who presents with depressed and anxious mood\affect.  Patient was pleasant, cooperative, and maintained good eye contact.  Amber Nielsen was alert and oriented x5.  She was dressed casually in session.  Primary stressors today's relationship, work, and medication management.  Patient reports that she was supposed to have medication management appointment with Amber Nielsen on Friday but missed that appointment.  Patient states that overall her Lamictal has been working well for her as evidenced by her PHQ-9 decreasing by 9 points in the last 4 weeks since starting the medication.  However patient should be noted that her GAD-7 score  did not improve that significantly going from a 19 to an 18 in today's session.   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  Patient was verbally upset that she missed her appointment.  LCSW utilized solution focused therapy to reach out to scheduling team for next available appointment with her medication provider.  Next available appointment was 11-22 at 4 PM and patient was agreeable to that time slot.  LCSW also offered patient walk-in appointments Wednesdays Thursdays and Fridays from 8 AM to 11 AM on a first come first serve basis.  Patient reports that her relationship has improved, however patient still reports a lack of motivation, this is as evidenced by lack of self-care such as shaving her legs, coloring her hair, maintaining her hair, and wearing make-up.  Patient reports that she is still doing the basics for deodorant and showering.   Suicidal/Homicidal: NAwithout intent/plan  Therapist Response:    Intervention/Plan: Supportive, cognitive behavioral therapy, and person centered therapy was utilized in today's session.  LCSW utilized open-ended questions cognitive restructuring and reframing them today session.  LCSW used genuineness and empowerment for person centered therapy.  And for supportive therapy utilized praise and encouragement.  Plan for patient is to discuss anxiety with medication provider on next visit due to severe decrease as evidenced by PHQ-9 of depression and a minimal decrease on GAD-7 for anxiety  Plan: Return again  in 4 weeks.     Amber Cooks, LCSW 12/18/2020

## 2020-12-19 ENCOUNTER — Other Ambulatory Visit: Payer: Self-pay

## 2020-12-19 ENCOUNTER — Ambulatory Visit (HOSPITAL_COMMUNITY)
Admission: EM | Admit: 2020-12-19 | Discharge: 2020-12-19 | Disposition: A | Discharge status: Home / Self Care | Payer: Medicaid Other | Attending: Emergency Medicine | Admitting: Emergency Medicine

## 2020-12-19 ENCOUNTER — Encounter (HOSPITAL_COMMUNITY): Payer: Self-pay | Admitting: Emergency Medicine

## 2020-12-19 DIAGNOSIS — R42 Dizziness and giddiness: Secondary | ICD-10-CM | POA: Diagnosis present

## 2020-12-19 LAB — CBG MONITORING, ED: Glucose-Capillary: 70 mg/dL (ref 70–99)

## 2020-12-19 LAB — COMPREHENSIVE METABOLIC PANEL
ALT: 35 U/L (ref 0–44)
AST: 22 U/L (ref 15–41)
Albumin: 3.7 g/dL (ref 3.5–5.0)
Alkaline Phosphatase: 57 U/L (ref 38–126)
Anion gap: 7 (ref 5–15)
BUN: 9 mg/dL (ref 6–20)
CO2: 27 mmol/L (ref 22–32)
Calcium: 9.1 mg/dL (ref 8.9–10.3)
Chloride: 105 mmol/L (ref 98–111)
Creatinine, Ser: 0.6 mg/dL (ref 0.44–1.00)
GFR, Estimated: >60 mL/min (ref >60)
Glucose, Bld: 73 mg/dL (ref 70–99)
Potassium: 4.2 mmol/L (ref 3.5–5.1)
Sodium: 139 mmol/L (ref 135–145)
Total Bilirubin: 0.6 mg/dL (ref 0.3–1.2)
Total Protein: 6.6 g/dL (ref 6.5–8.1)

## 2020-12-19 NOTE — Discharge Instructions (Addendum)
CBG 70, it is as possibility that your blood sugar was low during event, purchase glucose tablets from pharmacy for use if another event occurs   Lab work is pending, you will be notified if any concerning values  Please go to the nearest emergency department if symptoms worsen for full work-up and further evaluation

## 2020-12-19 NOTE — ED Provider Notes (Signed)
[facility]    CSN: [id number] Arrival date & time: 12/19/20  1145      History   Chief Complaint Chief Complaint  Patient presents with   Headache   Generalized Body Aches   Neck Pain   Fatigue   Dizziness    HPI Amber Nielsen is a 35 y.o. female.   Patient presents with intermittent episodes of dizziness, Discomfort, lightheadedness, body aches and nausea occurring for the last 3 days.  Headaches are described as frontal with light sensitivity.  Endorses that she did eat breakfast this morning .positive COVID test 13 days ago, however patient endorses that symptoms subsided after 5.  No known sick contacts after COVID exposure.  History of anxiety, asthma, bipolar 1 disorder, hypertension, gastric sleeve.    Past Medical History:  Diagnosis Date   Acute pyelonephritis    Anxiety    Asthma    allergy related. 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   Cannabis abuse 02/02/2016   Morbid obesity (HCC) 12/15/2012    Past Surgical History:  Procedure Laterality Date   COLONOSCOPY     CYSTOSCOPY W/ URETERAL STENT PLACEMENT Left 08/26/2016   Procedure: CYSTOSCOPY WITH RETROGRADE PYELOGRAM/URETERAL STENT PLACEMENT;  Surgeon: Marcine Matar, MD;  Location: [facility] OR;  Service: Urology;  Laterality: Left;   CYSTOSCOPY WITH RETROGRADE PYELOGRAM, URETEROSCOPY  AND STENT PLACEMENT Left 09/14/2016   Procedure: CYSTOSCOPY WITH URETEROSCOPY , STONE EXTRACTION AND STENT REMOVAL;  Surgeon: Marcine Matar, MD;  Location: [facility];  Service: Urology;  Laterality: Left;   GASTRIC BYPASS     TOOTH EXTRACTION      OB History     Gravida  4   Para  2   Term  2   Preterm  0   AB  2   Living  2      SAB  2   IAB  0   Ectopic  0   Multiple  0   Live Births  2            Home Medications    Prior to Admission medications   Medication Sig Start Date End Date Taking? Authorizing Provider  buPROPion (WELLBUTRIN XL) 150 MG 24 hr tablet Take by mouth. 09/04/20  Yes [provider]  albuterol (PROVENTIL HFA;VENTOLIN HFA) 108 (90 Base) MCG/ACT inhaler Inhale 2 puffs into the lungs every 6 (six) hours as needed for wheezing or shortness of breath.    [provider]  amoxicillin (AMOXIL) 500 MG capsule Take 1 capsule (500 mg total) by mouth 2 (two) times daily. 08/09/20   Raspet, Noberto Retort, PA-C  Calcium-Vitamin D-Vitamin K (CALCIUM + D) [phone] MG-UNT-MCG CHEW See admin instructions.    [provider]  cetirizine (ZYRTEC ALLERGY) 10 MG tablet Take 1 tablet (10 mg total) by mouth daily. 08/13/20   Urban Gibson,  Marquita Palms, PA-C  citalopram (CELEXA) 20 MG tablet Take 1 tablet (20 mg total) daily by mouth. Patient taking differently: Take 30 mg by mouth at bedtime. 01/12/17   Allie Bossier, MD  clindamycin (CLEOCIN) 300 MG capsule Take 1 capsule (300 mg total) by mouth 3 (three) times daily. 08/13/20   Wallis Bamberg, PA-C  lamoTRIgine (LAMICTAL) 25 MG tablet Take 1 tablet (25 mg total) by mouth daily. 10/25/20 10/25/21  Nwoko, Tommas Olp, PA  lamoTRIgine (LAMICTAL) 25 MG tablet Take 2 tablets (50 mg total) by mouth daily. 11/08/20 11/08/21  Nwoko, Tommas Olp, PA  levonorgestrel (MIRENA, 52 MG,) 20 MCG/24HR IUD 1 each by Intrauterine route continuous.    [provider]  Multiple Vitamin (MULTIVITAMIN) tablet Take 1 tablet by mouth daily.     [provider]  omeprazole (PRILOSEC) 40 MG capsule Take 40 mg by mouth 2 (two) times daily. 07/20/19   [provider]  pseudoephedrine (SUDAFED) 60 MG tablet Take 1 tablet (60 mg total) by mouth every 8 (eight) hours as needed for congestion. 08/13/20   Wallis Bamberg, PA-C  Vitamin D, Ergocalciferol, (DRISDOL) 1.25 MG (50000 UNIT) CAPS capsule Take 50,000 Units by mouth every 7 (seven) days.    [provider]    Family History Family History  Problem Relation Age of Onset   Heart disease Father    Heart attack Father    Nephrolithiasis Father    Colon polyps Father    Healthy Mother    Mental retardation Maternal Aunt    Diabetes Maternal Grandmother    Cancer Maternal Grandmother        ovarian and breast   Diabetes Maternal Grandfather    Diabetes Paternal Grandmother    Hypertension Paternal Grandmother    Heart disease Paternal Grandmother    Colon polyps Paternal Grandmother    Diabetes Paternal Grandfather    Colon polyps Paternal Uncle     Social History Social History   Tobacco Use   Smoking status: Every Day    Packs/day: 0.50    Types: Cigarettes   Smokeless tobacco: Never  Vaping Use   Vaping Use: Never used  Substance Use Topics   Alcohol use: Yes    Comment: socially   Drug use: Yes    Types: Marijuana    Comment: daily use or  smoke 1/8 weekly     Allergies   Metformin and related, Celery oil, Cymbalta [duloxetine hcl], Lamictal [lamotrigine], and Prozac [fluoxetine hcl]   Review of Systems Review of Systems  Constitutional: Negative.   HENT: Negative.    Respiratory: Negative.    Cardiovascular: Negative.   Gastrointestinal: Negative.   Musculoskeletal:  Positive for neck pain. Negative for arthralgias, back pain, gait problem, joint swelling and neck stiffness.  Skin: Negative.   Neurological:  Positive for dizziness, tremors, weakness, light-headedness and headaches. Negative for seizures, syncope, facial  asymmetry, speech difficulty and numbness.    Physical Exam Triage Vital Signs ED Triage Vitals  Enc Vitals Group     BP 12/19/20 1310 116/79     Pulse Rate 12/19/20 1310 63     Resp 12/19/20 1310 18     Temp 12/19/20 1310 99 F (37.2 C)     Temp src --      SpO2 12/19/20 1310 98 %     Weight --      Height --      Head Circumference --      Peak Flow --  Pain Score 12/19/20 1309 10     Pain Loc --      Pain Edu? --      Excl. in GC? --    No data found.  Updated Vital Signs BP 116/79   Pulse 63   Temp 99 F (37.2 C)   Resp 18   SpO2 98%   Breastfeeding No   Visual Acuity Right Eye Distance:   Left Eye Distance:   Bilateral Distance:    Right Eye Near:   Left Eye Near:    Bilateral Near:     Physical Exam Constitutional:      Appearance: Normal appearance. She is well-developed and normal weight.  HENT:     Head: Normocephalic.     Right Ear: Tympanic membrane, ear canal and external ear normal.     Left Ear: Tympanic membrane, ear canal and external ear normal.     Nose: Nose normal.     Mouth/Throat:     Mouth: Mucous membranes are moist.     Pharynx: Oropharynx is clear.  Eyes:     Extraocular Movements: Extraocular movements intact.  Cardiovascular:     Rate and Rhythm: Normal rate and regular rhythm.     Pulses: Normal pulses.     Heart sounds: Normal heart sounds.  Pulmonary:     Effort: Pulmonary effort is normal.  Musculoskeletal:        General: Normal range of motion.     Cervical back: Normal range of motion and neck supple.  Skin:    General: Skin is warm and dry.  Neurological:     General: No focal deficit present.     Mental Status: She is alert and oriented to person, place, and time. Mental status is at baseline.     Cranial Nerves: No cranial nerve deficit.     Sensory: No sensory deficit.     Motor: No weakness.  Psychiatric:        Mood and Affect: Mood normal.        Behavior: Behavior normal.     UC Treatments /  Results  Labs (all labs ordered are listed, but only abnormal results are displayed) Labs Reviewed  COMPREHENSIVE METABOLIC PANEL  CBG MONITORING, ED    EKG   Radiology No results found.  Procedures Procedures (including critical care time)  Medications Ordered in UC Medications - No data to display  Initial Impression / Assessment and Plan / UC Course  I have reviewed the triage vital signs and the nursing notes.  Pertinent labs & imaging results that were available during my care of the patient were reviewed by me and considered in my medical decision making (see chart for details).  Dizziness  Point-of-care CBG 70, possibility that level is lower during the event, discussed with patient, advised patient to purchase over-the-counter glucose tablets in case another event is to occur, has virtual visit with primary care doctor tomorrow we will follow-up, CMP pending, will notify patient of any concerning values and how to move forward, exam is unremarkable and vital signs are stable, patient is okay to discharge to home  Final Clinical Impressions(s) / UC Diagnoses   Final diagnoses:  None   Discharge Instructions   None    ED Prescriptions   None    PDMP not reviewed this encounter.   Valinda Hoar, NP 12/19/20 1416

## 2020-12-19 NOTE — ED Triage Notes (Signed)
Pt is present today with general body aches, HA, neck pain, and dizziness. Pt states sx started x3 days ago

## 2020-12-25 ENCOUNTER — Telehealth (INDEPENDENT_AMBULATORY_CARE_PROVIDER_SITE_OTHER): Payer: Medicaid Other | Admitting: Physician Assistant

## 2020-12-25 ENCOUNTER — Encounter (HOSPITAL_COMMUNITY): Payer: Self-pay | Admitting: Physician Assistant

## 2020-12-25 ENCOUNTER — Other Ambulatory Visit: Payer: Self-pay

## 2020-12-25 DIAGNOSIS — F3181 Bipolar II disorder: Secondary | ICD-10-CM

## 2020-12-25 DIAGNOSIS — F39 Unspecified mood [affective] disorder: Secondary | ICD-10-CM

## 2020-12-25 MED ORDER — LAMOTRIGINE 100 MG PO TABS
100.0000 mg | ORAL_TABLET | Freq: Every day | ORAL | 1 refills | Status: DC
Start: 2020-12-25 — End: 2021-01-28

## 2020-12-25 MED ORDER — BUPROPION HCL ER (XL) 300 MG PO TB24: 300.0000 mg | ORAL_TABLET | Freq: Every day | ORAL | 1 refills | Status: DC

## 2020-12-25 NOTE — Progress Notes (Addendum)
[facility] MD/PA/NP OP Progress Note  Virtual Visit via Telephone Note  I connected with Amber Nielsen on 12/25/20 at 10:30 AM EDT by telephone and verified that I am speaking with the correct person using two identifiers.  Location: Patient: Home Provider: Clinic   I discussed the limitations, risks, security and privacy concerns of performing an evaluation and management service by telephone and the availability of in person appointments. I also discussed with the patient that there may be a patient responsible charge related to this service. The patient expressed understanding and agreed to proceed.  Follow Up Instructions:  I discussed the assessment and treatment plan with the patient. The patient was provided an opportunity to ask questions and all were answered. The patient agreed with the plan and demonstrated an understanding of the instructions.   The patient was advised to call back or seek an in-person evaluation if the symptoms worsen or if the condition fails to improve as anticipated.  I provided 21 minutes of non-face-to-face time during this encounter.  Meta Hatchet, PA   12/25/2020 12:32 PM Amber Nielsen  MRN:  [id number]  Chief Complaint: Follow and medication management  HPI:   Amber Nielsen is a 35 year old female with a past psychiatric history significant for bipolar 2 disorder, and unspecified mood disorder who presents to [facility] via virtual telephone visit for follow-up and medication management.  Patient is currently being managed on the following medications:  Bupropion (Wellbutrin XL) 150 mg 24-hour tablet daily Lamictal 50 mg daily  Patient reports experiencing no adverse side effects from her current medication regimen.  For the first 2 weeks that she was on her medications, she noted experiencing her brain slowing down as well as slower impulses.  She stated that she woke up every day feeling nice.  Today, she  states that her mood is all over the place and that she is easily annoyed at things.  For example, patient states that she was so annoyed when her husband came back home during his lunch break.  As an example she gave for her mood being up and down, she states that she cried when she found out a child not related to her was called fat by her mother.  Patient reports that she has been depressed a lot more lately and has noticed hypersomnia.  She states that her anxiety is really high and that she has been having issues with making commitments.  Patient endorses self-loathing especially when she was realized that she was mad about her boyfriend interrupting her alone time.  Patient notes that she is tired minding activities of daily living such as personal hygiene (example: Not doing her hair, not shaving her legs, and not putting on make-up).  A PHQ-9 screen was performed with the patient scoring a 19.  A GAD-7 screen was also performed with the patient scoring a 21.  Patient is alert and oriented x4, calm, cooperative, and fully engaged in conversation during the encounter.  Patient endorses calm however she feels anxious.  Patient denies suicidal or homicidal ideations.  She further denies auditory or visual hallucinations and does not appear to be responding to internal/external stimuli.  Patient endorses fair sleep and receives on average 6 to 8 hours of sleep each night.  When she wakes, patient states it is often hard to get going.  Patient endorses fluctuating appetite but will occasionally go on a binging spree.  Patient endorses usually having 1 meal and grazing  throughout the day.  Patient denies alcohol consumption.  Patient endorses tobacco use and smokes on average less than a pack a day.  Patient endorses illicit drug use in the form of marijuana.   Visit Diagnosis:    ICD-10-CM   1. Bipolar 2 disorder (HCC)  F31.81 buPROPion (WELLBUTRIN XL) 300 MG 24 hr tablet    lamoTRIgine (LAMICTAL) 100 MG  tablet    2. Unspecified mood (affective) disorder (HCC)  F39 lamoTRIgine (LAMICTAL) 100 MG tablet      Past Psychiatric History:  Bipolar 2 disorder Unspecified mood disorder  Past Medical History:  Past Medical History:  Diagnosis Date   Acute pyelonephritis    Anxiety    Asthma    allergy related. rodent animals   Bipolar 1 disorder (HCC)    Depression    H/O varicella    Headache(784.0)    History of gestational diabetes mellitus (GDM) 01/12/2017   History of pre-eclampsia 01/12/2017   HTN (hypertension)    IBS (irritable bowel syndrome)    Obesity    Pregnancy induced hypertension    Sepsis secondary to UTI (HCC) 08/26/2016   Ureteral stone with hydronephrosis 08/27/2016    Past Surgical History:  Procedure Laterality Date   COLONOSCOPY     CYSTOSCOPY W/ URETERAL STENT PLACEMENT Left 08/26/2016   Procedure: CYSTOSCOPY WITH RETROGRADE PYELOGRAM/URETERAL STENT PLACEMENT;  Surgeon: Marcine Matar, MD;  Location: [facility] OR;  Service: Urology;  Laterality: Left;   CYSTOSCOPY WITH RETROGRADE PYELOGRAM, URETEROSCOPY AND STENT PLACEMENT Left 09/14/2016   Procedure: CYSTOSCOPY WITH URETEROSCOPY , STONE EXTRACTION AND STENT REMOVAL;  Surgeon: Marcine Matar, MD;  Location: [facility];  Service: Urology;  Laterality: Left;   GASTRIC BYPASS     TOOTH EXTRACTION      Family Psychiatric History:  Mother - patient reports that her mother had several psychiatric illnesses ranging from major depressive disorder, bipolar depression, and schizophrenia.  Family History:  Family History  Problem Relation Age of Onset   Heart disease Father    Heart attack Father    Nephrolithiasis Father    Colon polyps Father    Healthy Mother    Mental retardation Maternal Aunt    Diabetes Maternal Grandmother    Cancer Maternal Grandmother        ovarian and breast   Diabetes Maternal Grandfather    Diabetes Paternal Grandmother    Hypertension Paternal Grandmother    Heart disease Paternal  Grandmother    Colon polyps Paternal Grandmother    Diabetes Paternal Grandfather    Colon polyps Paternal Uncle     Social History:  Social History   Socioeconomic History   Marital status: Significant Other    Spouse name: Not on file   Number of children: 2   Years of education: Not on file   Highest education level: Not on file  Occupational History   Occupation: hair stylist  Tobacco Use   Smoking status: Every Day    Packs/day: 0.50    Types: Cigarettes   Smokeless tobacco: Never  Vaping Use   Vaping Use: Never used  Substance and Sexual Activity   Alcohol use: Yes    Comment: socially   Drug use: Yes    Types: Marijuana    Comment: daily use or  smoke 1/8 weekly   Sexual activity: Yes    Partners: Male    Birth control/protection: I.U.D.  Other Topics Concern   Not on file  Social History Narrative   Not on file  Social Determinants of Health   Financial Resource Strain: Low Risk    Difficulty of Paying Living Expenses: Not very hard  Food Insecurity: Food Insecurity Present   Worried About Running Out of Food in the Last Year: Sometimes true   Ran Out of Food in the Last Year: Sometimes true  Transportation Needs: No Transportation Needs   Lack of Transportation (Medical): No   Lack of Transportation (Non-Medical): No  Physical Activity: Inactive   Days of Exercise per Week: 0 days   Minutes of Exercise per Session: 0 min  Stress: Stress Concern Present   Feeling of Stress : Very much  Social Connections: Socially Isolated   Frequency of Communication with Friends and Family: Never   Frequency of Social Gatherings with Friends and Family: Once a week   Attends Religious Services: Never   Database administrator or Organizations: No   Attends Banker Meetings: Never   Marital Status: Living with partner    Allergies:  Allergies  Allergen Reactions   Metformin And Related Diarrhea and Nausea And Vomiting    Other reaction(s):  diarrhea   Celery Oil Swelling    Tongue swelling when eating celery   Cymbalta [Duloxetine Hcl] Other (See Comments)    "out of body"   Lamictal [Lamotrigine] Other (See Comments)    unknown   Prozac [Fluoxetine Hcl] Other (See Comments)    Anger problems    Metabolic Disorder Labs: Lab Results  Component Value Date   HGBA1C 6.3 (H) 01/12/2017   No results found for: PROLACTIN Lab Results  Component Value Date   CHOL 171 07/03/2013   TRIG 162 (H) 07/03/2013   HDL 45 07/03/2013   LDLCALC 94 07/03/2013   Lab Results  Component Value Date   TSH 0.949 01/17/2018   TSH 1.20 01/10/2014    Therapeutic Level Labs: No results found for: LITHIUM No results found for: VALPROATE No components found for:  CBMZ  Current Medications: Current Outpatient Medications  Medication Sig Dispense Refill   albuterol (PROVENTIL HFA;VENTOLIN HFA) 108 (90 Base) MCG/ACT inhaler Inhale 2 puffs into the lungs every 6 (six) hours as needed for wheezing or shortness of breath.     amoxicillin (AMOXIL) 500 MG capsule Take 1 capsule (500 mg total) by mouth 2 (two) times daily. 20 capsule 0   buPROPion (WELLBUTRIN XL) 300 MG 24 hr tablet Take 1 tablet (300 mg total) by mouth daily. 30 tablet 1   Calcium-Vitamin D-Vitamin K (CALCIUM + D) [phone] MG-UNT-MCG CHEW See admin instructions.     cetirizine (ZYRTEC ALLERGY) 10 MG tablet Take 1 tablet (10 mg total) by mouth daily. 30 tablet 0   citalopram (CELEXA) 20 MG tablet Take 1 tablet (20 mg total) daily by mouth. (Patient taking differently: Take 30 mg by mouth at bedtime.) 30 tablet 2   clindamycin (CLEOCIN) 300 MG capsule Take 1 capsule (300 mg total) by mouth 3 (three) times daily. 30 capsule 0   lamoTRIgine (LAMICTAL) 100 MG tablet Take 1 tablet (100 mg total) by mouth daily. 30 tablet 1   levonorgestrel (MIRENA, 52 MG,) 20 MCG/24HR IUD 1 each by Intrauterine route continuous.     Multiple Vitamin (MULTIVITAMIN) tablet Take 1 tablet by mouth daily.      omeprazole (PRILOSEC) 40 MG capsule Take 40 mg by mouth 2 (two) times daily.     pseudoephedrine (SUDAFED) 60 MG tablet Take 1 tablet (60 mg total) by mouth every 8 (eight) hours as needed for  congestion. 30 tablet 0   Vitamin D, Ergocalciferol, (DRISDOL) 1.25 MG (50000 UNIT) CAPS capsule Take 50,000 Units by mouth every 7 (seven) days.     No current facility-administered medications for this visit.     Musculoskeletal: Strength & Muscle Tone: Unable to assess due to telemedicine visit Gait & Station: Unable to assess due to telemedicine visit Patient leans: Unable to assess due to telemedicine visit  Psychiatric Specialty Exam: Review of Systems  Psychiatric/Behavioral:  Positive for decreased concentration and sleep disturbance. Negative for dysphoric mood, hallucinations, self-injury and suicidal ideas. The patient is nervous/anxious. The patient is not hyperactive.    There were no vitals taken for this visit.There is no height or weight on file to calculate BMI.  General Appearance: Unable to assess due to telemedicine visit  Eye Contact:  Unable to assess due to telemedicine visit  Speech:  Clear and Coherent and Normal Rate  Volume:  Normal  Mood:  Anxious, Depressed, and Irritable  Affect:  Congruent and Depressed  Thought Process:  Coherent, Goal Directed, and Descriptions of Associations: Intact  Orientation:  Full (Time, Place, and Person)  Thought Content: WDL   Suicidal Thoughts:  No  Homicidal Thoughts:  No  Memory:  Immediate;   Good Recent;   Good Remote;   Fair  Judgement:  Fair  Insight:  Fair  Psychomotor Activity:  Normal  Concentration:  Concentration: Good and Attention Span: Good  Recall:  Good  Fund of Knowledge: Good  Language: Good  Akathisia:  NA  Handed:  Right  AIMS (if indicated): not done  Assets:  Communication Skills Desire for Improvement Housing Social Support Vocational/Educational  ADL's:  Intact  Cognition: WNL  Sleep:  Fair    Screenings: AUDIT    Flowsheet Row Admission (Discharged) from 02/02/2016 in [facility]INE  Alcohol Use Disorder Identification Test Final Score (AUDIT) 1      GAD-7    Flowsheet Row Video Visit from 12/25/2020 in [facility] Counselor from 12/18/2020 in [facility] Office Visit from 10/25/2020 in [facility]  Total GAD-7 Score 21 18 19       PHQ2-9    Flowsheet Row Video Visit from 12/25/2020 in [facility] Counselor from 12/18/2020 in [facility] Office Visit from 10/25/2020 in [facility] Counselor from 09/30/2020 in [place]  PHQ-2 Total Score 4 2 4 2   PHQ-9 Total Score 19 11 20 15       Flowsheet Row Video Visit from 12/25/2020 in [facility] ED from 12/19/2020 in [facility] Visit from 10/25/2020 in [facility]  C-SSRS RISK CATEGORY Low Risk No Risk Low Risk        Assessment and Plan:   Amber Nielsen is a 35 year old female with a past psychiatric history significant for bipolar 2 disorder, and unspecified mood disorder who presents to [facility] via virtual telephone visit for follow-up and medication management.  Patient reports her medications were helpful in the beginning but now she is experiencing fluctuating mood, depressive episode, and anxiety.  Patient was recommended increasing her dosage of lamotrigine from 50 mg to 100 mg daily for the management of her irritability, restlessness, and for mood stabilization.  Patient was also recommended adjusting her dosage of bupropion from 150 mg to 300 mg daily.  Patient was agreeable to recommendations.  Patient's medications to be e-prescribed to pharmacy of choice.  1.  Bipolar 2 disorder (HCC)  - buPROPion (WELLBUTRIN XL) 300 MG 24 hr tablet; Take 1 tablet (300 mg total) by mouth daily.  Dispense: 30 tablet; Refill: 1 - lamoTRIgine (LAMICTAL) 100 MG tablet; Take 1 tablet (100 mg total) by mouth daily.  Dispense: 30 tablet; Refill: 1  2. Unspecified mood (affective) disorder (HCC)  - lamoTRIgine (LAMICTAL) 100 MG tablet; Take 1 tablet (100 mg total) by mouth daily.  Dispense: 30 tablet; Refill: 1  Patient to follow up on November 22nd Provider spent a total of 21 minutes with the patient/reviewing patient's chart  Meta Hatchet, PA 12/25/2020, 12:32 PM

## 2021-01-08 ENCOUNTER — Ambulatory Visit (INDEPENDENT_AMBULATORY_CARE_PROVIDER_SITE_OTHER): Payer: Medicaid Other | Admitting: Licensed Clinical Social Worker

## 2021-01-08 DIAGNOSIS — F3181 Bipolar II disorder: Secondary | ICD-10-CM | POA: Diagnosis not present

## 2021-01-08 NOTE — Plan of Care (Signed)
Pt treatment plan updated
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
## 2021-01-08 NOTE — Plan of Care (Signed)
  Problem: Depression CCP Problem  1 bipolar disorder depression  Goal: LTG: @PREFFIRSTNAME @ will score less than 10 on the Patient Health Questionnaire (PHQ-9) Outcome: Progressing Goal: STG: Soni  will participate in at least 80% of scheduled individual psychotherapy sessions Outcome: Progressing Goal: STG: Reduce overall depression score by a minimum of 25% on the Patient Health Questionnaire (PHQ-9) or the Montgomery-Asberg Depression Rating Scale (MADRS) Outcome: Progressing  PHQ-9 continue to decrease.

## 2021-01-16 ENCOUNTER — Other Ambulatory Visit (HOSPITAL_COMMUNITY): Payer: Self-pay | Admitting: Physician Assistant

## 2021-01-16 DIAGNOSIS — F39 Unspecified mood [affective] disorder: Secondary | ICD-10-CM

## 2021-01-28 ENCOUNTER — Telehealth (INDEPENDENT_AMBULATORY_CARE_PROVIDER_SITE_OTHER): Payer: Medicaid Other | Admitting: Physician Assistant

## 2021-01-28 ENCOUNTER — Other Ambulatory Visit: Payer: Self-pay

## 2021-01-28 ENCOUNTER — Encounter (HOSPITAL_COMMUNITY): Payer: Self-pay | Admitting: Physician Assistant

## 2021-01-28 DIAGNOSIS — F3181 Bipolar II disorder: Secondary | ICD-10-CM

## 2021-01-28 DIAGNOSIS — F39 Unspecified mood [affective] disorder: Secondary | ICD-10-CM

## 2021-01-28 MED ORDER — BUPROPION HCL ER (XL) 300 MG PO TB24: 300.0000 mg | ORAL_TABLET | Freq: Every day | ORAL | 1 refills | Status: DC

## 2021-01-28 MED ORDER — LAMOTRIGINE 100 MG PO TABS: 100.0000 mg | ORAL_TABLET | Freq: Every day | ORAL | 1 refills | Status: DC

## 2021-01-28 NOTE — Progress Notes (Addendum)
BH MD/PA/NP OP Progress Note  Virtual Visit via Telephone Note  I connected with Amber Nielsen on 01/28/21 at  4:00 PM EST by telephone and verified that I am speaking with the correct person using two identifiers.  Location: Patient: Home Provider: Clinic   I discussed the limitations, risks, security and privacy concerns of performing an evaluation and management service by telephone and the availability of in person appointments. I also discussed with the patient that there may be a patient responsible charge related to this service. The patient expressed understanding and agreed to proceed.  Follow Up Instructions:  I discussed the assessment and treatment plan with the patient. The patient was provided an opportunity to ask questions and all were answered. The patient agreed with the plan and demonstrated an understanding of the instructions.   The patient was advised to call back or seek an in-person evaluation if the symptoms worsen or if the condition fails to improve as anticipated.  I provided 15 minutes of non-face-to-face time during this encounter.  Meta Hatchet, PA    01/28/2021 4:38 PM Amber Nielsen  MRN:  [id number]  Chief Complaint: Follow up and medication management  HPI:   Amber Nielsen is a 35 year old female with a past psychiatric history significant for bipolar 2 disorder and unspecified mood disorder who presents to [facility] via virtual telephone visit for follow-up and medication management.  Patient is currently being managed on the following medications:  Bupropion (Wellbutrin XL) 300 mg 24-hour tablet daily Lamotrigine 100 mg daily  Patient reports no issues or concerns regarding her current medication regimen.  She states that her depressive episodes have been less frequent.  She also endorses being less argumentative and being able to rationalize things better.  She reports that her anxiety has been more  manageable which she attributes to working in a new position as well as traveling for work.  The only major concern that the patient has today is that her thoughts still being all over the place which she attributes to her unmanaged ADHD.  Despite her one concern, patient feels better about a lot of the life decisions she has made.  A PHQ-9 screen was performed with the patient scoring a 13.  A GAD-7 screen was also performed with the patient scoring a 10.  Patient is alert and oriented x4, calm, cooperative, and fully engaged in conversation during the encounter.  Patient endorses being in a really good mood.  Patient denies suicidal or homicidal ideations.  She further denies auditory or visual hallucinations and does not appear to be responding to internal/external stimuli.  Patient endorses fair sleep and receives on average 8 to 10 hours of sleep each night.  She states that she still struggles with having interrupted sleep patterns.  Patient endorses good appetite and eats on average 5-6 small meals with snacking in between.  Patient denies alcohol consumption.  She endorses tobacco use and smokes on average a pack per day.  Patient endorses illicit drug use in the form of marijuana and states that she tends to smoke in the evening.  Visit Diagnosis:    ICD-10-CM   1. Bipolar 2 disorder (HCC)  F31.81 lamoTRIgine (LAMICTAL) 100 MG tablet    buPROPion (WELLBUTRIN XL) 300 MG 24 hr tablet    2. Unspecified mood (affective) disorder (HCC)  F39 lamoTRIgine (LAMICTAL) 100 MG tablet      Past Psychiatric History: Bipolar 2 disorder Unspecified mood disorder  Past Medical  History:  Past Medical History:  Diagnosis Date   Acute pyelonephritis    Anxiety    Asthma    allergy related. rodent animals   Bipolar 1 disorder (HCC)    Depression    H/O varicella    Headache(784.0)    History of gestational diabetes mellitus (GDM) 01/12/2017   History of pre-eclampsia 01/12/2017   HTN (hypertension)     IBS (irritable bowel syndrome)    Obesity    Pregnancy induced hypertension    Sepsis secondary to UTI (HCC) 08/26/2016   Ureteral stone with hydronephrosis 08/27/2016    Past Surgical History:  Procedure Laterality Date   COLONOSCOPY     CYSTOSCOPY W/ URETERAL STENT PLACEMENT Left 08/26/2016   Procedure: CYSTOSCOPY WITH RETROGRADE PYELOGRAM/URETERAL STENT PLACEMENT;  Surgeon: Marcine Matar, MD;  Location: [facility] OR;  Service: Urology;  Laterality: Left;   CYSTOSCOPY WITH RETROGRADE PYELOGRAM, URETEROSCOPY AND STENT PLACEMENT Left 09/14/2016   Procedure: CYSTOSCOPY WITH URETEROSCOPY , STONE EXTRACTION AND STENT REMOVAL;  Surgeon: Marcine Matar, MD;  Location: [facility] ORS;  Service: Urology;  Laterality: Left;   GASTRIC BYPASS     TOOTH EXTRACTION      Family Psychiatric History:  Mother - patient reports that her mother had several psychiatric illnesses ranging from major depressive disorder, bipolar depression, and schizophrenia.  Family History:  Family History  Problem Relation Age of Onset   Heart disease Father    Heart attack Father    Nephrolithiasis Father    Colon polyps Father    Healthy Mother    Mental retardation Maternal Aunt    Diabetes Maternal Grandmother    Cancer Maternal Grandmother        ovarian and breast   Diabetes Maternal Grandfather    Diabetes Paternal Grandmother    Hypertension Paternal Grandmother    Heart disease Paternal Grandmother    Colon polyps Paternal Grandmother    Diabetes Paternal Grandfather    Colon polyps Paternal Uncle     Social History:  Social History   Socioeconomic History   Marital status: Significant Other    Spouse name: Not on file   Number of children: 2   Years of education: Not on file   Highest education level: Not on file  Occupational History   Occupation: hair stylist  Tobacco Use   Smoking status: Every Day    Packs/day: 0.50    Types: Cigarettes   Smokeless tobacco: Never  Vaping Use   Vaping Use:  Never used  Substance and Sexual Activity   Alcohol use: Yes    Comment: socially   Drug use: Yes    Types: Marijuana    Comment: daily use or  smoke 1/8 weekly   Sexual activity: Yes    Partners: Male    Birth control/protection: I.U.D.  Other Topics Concern   Not on file  Social History Narrative   Not on file   Social Determinants of Health   Financial Resource Strain: Low Risk    Difficulty of Paying Living Expenses: Not very hard  Food Insecurity: Food Insecurity Present   Worried About Running Out of Food in the Last Year: Sometimes true   Ran Out of Food in the Last Year: Sometimes true  Transportation Needs: No Transportation Needs   Lack of Transportation (Medical): No   Lack of Transportation (Non-Medical): No  Physical Activity: Inactive   Days of Exercise per Week: 0 days   Minutes of Exercise per Session: 0 min  Stress: Stress  Concern Present   Feeling of Stress : Very much  Social Connections: Socially Isolated   Frequency of Communication with Friends and Family: Never   Frequency of Social Gatherings with Friends and Family: Once a week   Attends Religious Services: Never   Database administrator or Organizations: No   Attends Banker Meetings: Never   Marital Status: Living with partner    Allergies:  Allergies  Allergen Reactions   Metformin And Related Diarrhea and Nausea And Vomiting    Other reaction(s): diarrhea   Celery Oil Swelling    Tongue swelling when eating celery   Cymbalta [Duloxetine Hcl] Other (See Comments)    "out of body"   Lamictal [Lamotrigine] Other (See Comments)    unknown   Prozac [Fluoxetine Hcl] Other (See Comments)    Anger problems    Metabolic Disorder Labs: Lab Results  Component Value Date   HGBA1C 6.3 (H) 01/12/2017   No results found for: PROLACTIN Lab Results  Component Value Date   CHOL 171 07/03/2013   TRIG 162 (H) 07/03/2013   HDL 45 07/03/2013   LDLCALC 94 07/03/2013   Lab Results   Component Value Date   TSH 0.949 01/17/2018   TSH 1.20 01/10/2014    Therapeutic Level Labs: No results found for: LITHIUM No results found for: VALPROATE No components found for:  CBMZ  Current Medications: Current Outpatient Medications  Medication Sig Dispense Refill   albuterol (PROVENTIL HFA;VENTOLIN HFA) 108 (90 Base) MCG/ACT inhaler Inhale 2 puffs into the lungs every 6 (six) hours as needed for wheezing or shortness of breath.     amoxicillin (AMOXIL) 500 MG capsule Take 1 capsule (500 mg total) by mouth 2 (two) times daily. 20 capsule 0   buPROPion (WELLBUTRIN XL) 300 MG 24 hr tablet Take 1 tablet (300 mg total) by mouth daily. 30 tablet 1   Calcium-Vitamin D-Vitamin K (CALCIUM + D) [phone] MG-UNT-MCG CHEW See admin instructions.     cetirizine (ZYRTEC ALLERGY) 10 MG tablet Take 1 tablet (10 mg total) by mouth daily. 30 tablet 0   citalopram (CELEXA) 20 MG tablet Take 1 tablet (20 mg total) daily by mouth. (Patient taking differently: Take 30 mg by mouth at bedtime.) 30 tablet 2   clindamycin (CLEOCIN) 300 MG capsule Take 1 capsule (300 mg total) by mouth 3 (three) times daily. 30 capsule 0   lamoTRIgine (LAMICTAL) 100 MG tablet Take 1 tablet (100 mg total) by mouth daily. 30 tablet 1   levonorgestrel (MIRENA, 52 MG,) 20 MCG/24HR IUD 1 each by Intrauterine route continuous.     Multiple Vitamin (MULTIVITAMIN) tablet Take 1 tablet by mouth daily.     omeprazole (PRILOSEC) 40 MG capsule Take 40 mg by mouth 2 (two) times daily.     pseudoephedrine (SUDAFED) 60 MG tablet Take 1 tablet (60 mg total) by mouth every 8 (eight) hours as needed for congestion. 30 tablet 0   Vitamin D, Ergocalciferol, (DRISDOL) 1.25 MG (50000 UNIT) CAPS capsule Take 50,000 Units by mouth every 7 (seven) days.     No current facility-administered medications for this visit.     Musculoskeletal: Strength & Muscle Tone: Unable to assess due to telemedicine visit Gait & Station: Unable to assess due  to telemedicine visit Patient leans: Unable to assess due to telemedicine visit  Psychiatric Specialty Exam: Review of Systems  Psychiatric/Behavioral:  Negative for decreased concentration, dysphoric mood, hallucinations, self-injury, sleep disturbance and suicidal ideas. The patient is nervous/anxious. The  patient is not hyperactive.    There were no vitals taken for this visit.There is no height or weight on file to calculate BMI.  General Appearance: Unable to assess due to telemedicine visit  Eye Contact:  Unable to assess due to telemedicine visit  Speech:  Clear and Coherent and Normal Rate  Volume:  Normal  Mood:  Anxious and Euthymic  Affect:  Appropriate and Congruent  Thought Process:  Coherent and Descriptions of Associations: Intact  Orientation:  Full (Time, Place, and Person)  Thought Content: WDL   Suicidal Thoughts:  No  Homicidal Thoughts:  No  Memory:  Immediate;   Good Recent;   Good Remote;   Fair  Judgement:  Fair  Insight:  Fair  Psychomotor Activity:  Normal  Concentration:  Concentration: Good and Attention Span: Good  Recall:  Good  Fund of Knowledge: Good  Language: Good  Akathisia:  NA  Handed:  Right  AIMS (if indicated): not done  Assets:  Communication Skills Desire for Improvement Housing Social Support Vocational/Educational  ADL's:  Intact  Cognition: WNL  Sleep:  Fair   Screenings: AUDIT    Flowsheet Row Admission (Discharged) from 02/02/2016 in [facility] INPATIENT BEH[facility]AL MEDICINE  Alcohol Use Disorder Identification Test Final Score (AUDIT) 1      GAD-7    Flowsheet Row Video Visit from 01/28/2021 in [facility] Video Visit from 12/25/2020 in [facility] Counselor from 12/18/2020 in [facility] Office Visit from 10/25/2020 in [facility]  Total GAD-7 Score 10 21 18 19       PHQ2-9    Flowsheet Row Video Visit from  01/28/2021 in [facility] Counselor from 01/08/2021 in [facility] Video Visit from 12/25/2020 in [facility] Counselor from 12/18/2020 in [facility] Office Visit from 10/25/2020 in [facility]  PHQ-2 Total Score 3 3 4 2 4   PHQ-9 Total Score 13 15 19 11 20       Flowsheet Row Video Visit from 01/28/2021 in [facility] Video Visit from 12/25/2020 in [facility] ED from 12/19/2020 in [facility] RISK CATEGORY Low Risk Low Risk No Risk        Assessment and Plan:   Carlin Mamone is a 35 year old female with a past psychiatric history significant for bipolar 2 disorder and unspecified mood disorder who presents to [facility] via virtual telephone visit for follow-up and medication management.  Patient reports no issues or concerns regarding her current medication regimen.  Patient denies the need for dosage adjustments at this time.  Patient appears to be in a good mood and states that her depressive symptoms and anxiety have been more manageable.  Patient to continue taking her medications as prescribed.  Patient's medications to be e-prescribed to pharmacy of choice.  1. Bipolar 2 disorder (HCC)  - lamoTRIgine (LAMICTAL) 100 MG tablet; Take 1 tablet (100 mg total) by mouth daily.  Dispense: 30 tablet; Refill: 1 - buPROPion (WELLBUTRIN XL) 300 MG 24 hr tablet; Take 1 tablet (300 mg total) by mouth daily.  Dispense: 30 tablet; Refill: 1  2. Unspecified mood (affective) disorder (HCC)  - lamoTRIgine (LAMICTAL) 100 MG tablet; Take 1 tablet (100 mg total) by mouth daily.  Dispense: 30 tablet; Refill: 1  Patient to follow up  in 2 months Provider spent a total of 15 minutes with the patient/reviewing patient's chart  Meta Hatchet, PA 01/28/2021, 4:38 PM

## 2021-02-07 ENCOUNTER — Ambulatory Visit (INDEPENDENT_AMBULATORY_CARE_PROVIDER_SITE_OTHER): Payer: Medicaid Other | Admitting: Licensed Clinical Social Worker

## 2021-02-07 DIAGNOSIS — F3181 Bipolar II disorder: Secondary | ICD-10-CM

## 2021-02-07 NOTE — Progress Notes (Signed)
   THERAPIST PROGRESS NOTE Virtual Visit via Video Note  I connected with Amber Nielsen on 02/07/21 at 10:00 AM EST by a video enabled telemedicine application and verified that I am speaking with the correct person using two identifiers.  Location: Patient: [facility] Provider: [facility] behavioral health   I discussed the limitations of evaluation and management by telemedicine and the availability of in person appointments. The patient expressed understanding and agreed to proceed.    I discussed the assessment and treatment plan with the patient. The patient was provided an opportunity to ask questions and all were answered. The patient agreed with the plan and demonstrated an understanding of the instructions.   The patient was advised to call back or seek an in-person evaluation if the symptoms worsen or if the condition fails to improve as anticipated.  I provided 40 minutes of non-face-to-face time during this encounter.   Weber Cooks, LCSW  Participation Level: Active  Behavioral Response: Casual and Fairly GroomedAlertAnxious and Depressed  Type of Therapy: Individual Therapy  Treatment Goals addressed: Anxiety and Diagnosis: bipolar depression  Interventions: CBT, Motivational Interviewing, and Supportive  Summary: Amber Nielsen is a 35 y.o. female who presents with depressed, anxious, tearful mood\affect.  Patient was pleasant, cooperative, and maintained good eye contact.  She was dressed casually and alert and oriented x5.  Primary stressor for today was her relationship.  Patient reports poor communication techniques in her personal relationships.  Patient reports "word vomiting", patient states that she gets irritable and anxious.  Amber Nielsen reports a lack of appreciation in her relationship.  She reports that her current relationship provided gifts to his ex girlfriend's and she does not receive those gifts.  Example being getting flowers 1 year ago and  has stated multiple times that she would like more gifts but has not got them.  Amber Nielsen reports a lack of appreciation, self worth, and quality in her personal relationship.  Interventions utilized in today's session were for supportive therapy, person centered therapy, and cognitive behavioral therapy.  LCSW utilized cognitive restructuring and reframing in today's session.  LCSW utilized techniques as "stop, think, listen".  LCSW explained that this provide patient a time out when getting into fights with her significant other to prevent "word vomiting".  LCSW explained "who, what, where, why" needs to be answered before responding during fights.  LCSW utilized language for praise, encouragement, and empowerment.  Plan: Plan for patient moving forward will be to write a "pros\cons list" for reasons why she wants to remain in her relationship.  This is to help provide a visual for patient to see the benefits or dissatisfaction she is receiving in the relationship.  Patient also to utilize technique for "stop, think, listen" at least weekly in her daily life.   Suicidal/Homicidal: Nowithout intent/plan    Plan: Return again in 4 weeks.     Weber Cooks, LCSW 02/07/2021

## 2021-03-11 ENCOUNTER — Ambulatory Visit (INDEPENDENT_AMBULATORY_CARE_PROVIDER_SITE_OTHER): Payer: Medicaid Other | Admitting: Licensed Clinical Social Worker

## 2021-03-11 DIAGNOSIS — F3181 Bipolar II disorder: Secondary | ICD-10-CM | POA: Diagnosis not present

## 2021-03-11 NOTE — Progress Notes (Signed)
° °  THERAPIST PROGRESS NOTE  Session Time: 45  Virtual Visit via Video Note  I connected with [name] on 03/11/21 at  9:00 AM EST by a video enabled telemedicine application and verified that I am speaking with the correct person using two identifiers.  Location: Patient: Amber Nielsen  Provider: [facility]    I discussed the limitations of evaluation and management by telemedicine and the availability of in person appointments. The patient expressed understanding and agreed to proceed.      I discussed the assessment and treatment plan with the patient. The patient was provided an opportunity to ask questions and all were answered. The patient agreed with the plan and demonstrated an understanding of the instructions.   The patient was advised to call back or seek an in-person evaluation if the symptoms worsen or if the condition fails to improve as anticipated.  I provided 45 minutes of non-face-to-face time during this encounter.   Weber Cooks, LCSW  Participation Level: Active  Behavioral Response: CasualAlertAnxious and Depressed  Type of Therapy: Individual Therapy  Treatment Goals addressed: Anxiety and Coping  Interventions: CBT and Supportive  Summary: Amber Nielsen is a 36 y.o. female who presents with depressed and anxious mood\affect.  Patient was alert and oriented x5.  She was pleasant, cooperative, and maintained good eye contact.  Patient was dressed casually and engaged well in therapy session.  Primary stressor for today's session as mood lability.  Patient reports multiple crying episodes 3-4 times per week.  Patient states that mood fluctuates multiple times per day from irritable to depressive.[name] reports lack of motivation as evidenced by not doing basic ADL activities such as grooming and bathing.  Patient states that her dishes have been piling up and that she "barely wants to get into the shower".  She states that her main  triggers are people utilizing the example of "I was in the car talking to a friend and they stated that they were with someone else and needed to call me back.  Then I got angry and wanted to self isolate for the rest of the day".  Suicidal/Homicidal: Nowithout intent/plan  Therapist Response:    Intervention/Plan: LCSW utilized interventions today for CBT, supportive therapy, and person centered therapy.  LCSW educated patient on signs and symptoms of bipolar 2 such as depressive and hypomanic episodes.  LCSW went over symptoms for her isolation and mood lability.  LCSW worked with patient on a worksheet for defense mechanisms going over "rationalization" in today's session.  Plan for patient moving forward will be to come up with 1 activity for plan of action when patient gets irritable such as low attention phone game.  Patient also to start utilizing personalization accomplishing 1 chore per day.  Patient to follow-up with LCSW and medication provider in the next 3 weeks   Plan: Return again in 3 weeks.      Weber Cooks, LCSW 03/11/2021

## 2021-03-11 NOTE — Plan of Care (Signed)
Pt agreeable to plan  ?

## 2021-04-01 ENCOUNTER — Telehealth (INDEPENDENT_AMBULATORY_CARE_PROVIDER_SITE_OTHER): Payer: Medicaid Other | Admitting: Family

## 2021-04-01 DIAGNOSIS — F39 Unspecified mood [affective] disorder: Secondary | ICD-10-CM | POA: Diagnosis not present

## 2021-04-01 DIAGNOSIS — F3181 Bipolar II disorder: Secondary | ICD-10-CM

## 2021-04-01 MED ORDER — LAMOTRIGINE 100 MG PO TABS: 150.0000 mg | ORAL_TABLET | Freq: Every day | ORAL | 1 refills | Status: DC

## 2021-04-01 MED ORDER — BUPROPION HCL ER (XL) 300 MG PO TB24: 300.0000 mg | ORAL_TABLET | Freq: Every day | ORAL | 1 refills | Status: DC

## 2021-04-01 MED ORDER — CITALOPRAM HYDROBROMIDE 20 MG PO TABS: 20.0000 mg | ORAL_TABLET | Freq: Every day | ORAL | 2 refills | Status: DC

## 2021-04-01 NOTE — Progress Notes (Signed)
Virtual Visit via Telephone Note  I connected with Amber Nielsen on 04/01/21 at  4:00 PM EST by telephone and verified that I am speaking with the correct person using two identifiers.  Location: Patient: Home Provider: Office   I discussed the limitations, risks, security and privacy concerns of performing an evaluation and management service by telephone and the availability of in person appointments. I also discussed with the patient that there may be a patient responsible charge related to this service. The patient expressed understanding and agreed to proceed.    I discussed the assessment and treatment plan with the patient. The patient was provided an opportunity to ask questions and all were answered. The patient agreed with the plan and demonstrated an understanding of the instructions.   The patient was advised to call back or seek an in-person evaluation if the symptoms worsen or if the condition fails to improve as anticipated.  I provided 15 minutes of non-face-to-face time during this encounter.   Amber Rack, NP   [facility] MD/PA/NP OP Progress Note  04/01/2021 1:03 PM Amber Nielsen  MRN:  [id number]   Amber Nielsen was evaluated telephonically.  Patient has a charted history of major depressive disorder, bipolar disorder and unspecified mood disorder.  Currently she is prescribed  Wellbutrin 300 mg and lamotrigine 100 mg 20 mg reports she does not feel that her mood has been stabilized.  She continues to report ongoing fluctuation in moods swings.  Reports mood irritability, decreased energy and worsening depression.  Reports initially she felt her medication was effective but does not feel her symptoms have been addressed currently.  She denies suicidal or homicidal ideations.  Denies auditory visual hallucinations.  States "I am not sure this medication regimen is helpful" as she stated  sue to a history of gastric bypass.  Amber Nielsen is also seeking medication for ADHD.  As she is  reporting inability to focus.  Reports she works for [employer] and stated " I don't know if Iam going or coming." states she was diagnosed with ADHD as a child.  Discussed titration Lamictal 100 mg to 150 mg daily and decrease Wellbutrin to 150 mg. Amber Nielsen to follow-up with  partial hospitalization programming.  Patient was receptive to plan.  She denied suicidal or homicidal ideation.  Denied auditory and visual hallucinations.  Support, encouragement and  reassurance was provided.    Visit Diagnosis:    ICD-10-CM   1. Bipolar 2 disorder (HCC)  F31.81 lamoTRIgine (LAMICTAL) 100 MG tablet    buPROPion (WELLBUTRIN XL) 300 MG 24 hr tablet    2. Unspecified mood (affective) disorder (HCC)  F39 lamoTRIgine (LAMICTAL) 100 MG tablet      Past Psychiatric History:   Past Medical History:  Past Medical History:  Diagnosis Date   Acute pyelonephritis    Anxiety    Asthma    allergy related. rodent animals   Bipolar 1 disorder (HCC)    Depression    H/O varicella    Headache(784.0)    History of gestational diabetes mellitus (GDM) 01/12/2017   History of pre-eclampsia 01/12/2017   HTN (hypertension)    IBS (irritable bowel syndrome)    Obesity    Pregnancy induced hypertension    Sepsis secondary to UTI (HCC) 08/26/2016   Ureteral stone with hydronephrosis 08/27/2016    Past Surgical History:  Procedure Laterality Date   COLONOSCOPY     CYSTOSCOPY W/ URETERAL STENT PLACEMENT Left 08/26/2016   Procedure: CYSTOSCOPY WITH RETROGRADE PYELOGRAM/URETERAL STENT  PLACEMENT;  Surgeon: [name], Stephen, MD;  [facility];  Service: Urology;  Laterality: Left;   CYSTOSCOPY WITH RETROGRADE PYELOGRAM, URETEROSCOPY AND STENT PLACEMENT Left 09/14/2016   Procedure: CYSTOSCOPY WITH URETEROSCOPY , STONE EXTRACTION AND STENT REMOVAL;  Surgeon: [name], Stephen, MD;  Location: [facility]S;  Service: Urology;  Laterality: Left;   GASTRIC BYPASS     TOOTH EXTRACTION      Family Psychiatric History:    Family History:  Family History  Problem Relation Age of Onset   Heart disease Father    Heart attack Father    Nephrolithiasis Father    Colon polyps Father    Healthy Mother    Mental retardation Maternal Aunt    Diabetes Maternal Grandmother    Cancer Maternal Grandmother        ovarian and breast   Diabetes Maternal Grandfather    Diabetes Paternal Grandmother    Hypertension Paternal Grandmother    Heart disease Paternal Grandmother    Colon polyps Paternal Grandmother    Diabetes Paternal Grandfather    Colon polyps Paternal Uncle     Social History:  Social History   Socioeconomic History   Marital status: Significant Other    Spouse name: Not on file   Number of children: 2   Years of education: Not on file   Highest education level: Not on file  Occupational History   Occupation: hair stylist  Tobacco Use   Smoking status: Every Day    Packs/day: 0.50    Types: Cigarettes   Smokeless tobacco: Never  Vaping Use   Vaping Use: Never used  Substance and Sexual Activity   Alcohol use: Yes    Comment: socially   Drug use: Yes    Types: Marijuana    Comment: daily use or  smoke 1/8 weekly   Sexual activity: Yes    Partners: Male    Birth control/protection: I.U.D.  Other Topics Concern   Not on file  Social History Narrative   Not on file   Social Determinants of Health   Financial Resource Strain: Low Risk    Difficulty of Paying Living Expenses: Not very hard  Food Insecurity: Food Insecurity Present   Worried About Running Out of Food in the Last Year: Sometimes true   Ran Out of Food in the Last Year: Sometimes true  Transportation Needs: No Transportation Needs   Lack of Transportation (Medical): No   Lack of Transportation (Non-Medical): No  Physical Activity: Inactive   Days of Exercise per Week: 0 days   Minutes of Exercise per Session: 0 min  Stress: Stress Concern Present   Feeling of Stress : Very much  Social Connections: Socially  Isolated   Frequency of Communication with Friends and Family: Never   Frequency of Social Gatherings with Friends and Family: Once a week   Attends Religious Services: Never   Database administratorActive Member of Clubs or Organizations: No   Attends BankerClub or Organization Meetings: Never   Marital Status: Living with partner    Allergies:  Allergies  Allergen Reactions   Metformin And Related Diarrhea and Nausea And Vomiting    Other reaction(s): diarrhea   Celery Oil Swelling    Tongue swelling when eating celery   Cymbalta [Duloxetine Hcl] Other (See Comments)    "out of body"   Lamictal [Lamotrigine] Other (See Comments)    unknown   Prozac [Fluoxetine Hcl] Other (See Comments)    Anger problems    Metabolic Disorder Labs: Lab  Results  Component Value Date   HGBA1C 6.3 (H) 01/12/2017   No results found for: PROLACTIN Lab Results  Component Value Date   CHOL 171 07/03/2013   TRIG 162 (H) 07/03/2013   HDL 45 07/03/2013   LDLCALC 94 07/03/2013   Lab Results  Component Value Date   TSH 0.949 01/17/2018   TSH 1.20 01/10/2014    Therapeutic Level Labs: No results found for: LITHIUM No results found for: VALPROATE No components found for:  CBMZ  Current Medications: Current Outpatient Medications  Medication Sig Dispense Refill   albuterol (PROVENTIL HFA;VENTOLIN HFA) 108 (90 Base) MCG/ACT inhaler Inhale 2 puffs into the lungs every 6 (six) hours as needed for wheezing or shortness of breath.     amoxicillin (AMOXIL) 500 MG capsule Take 1 capsule (500 mg total) by mouth 2 (two) times daily. 20 capsule 0   buPROPion (WELLBUTRIN XL) 300 MG 24 hr tablet Take 1 tablet (300 mg total) by mouth daily. 30 tablet 1   Calcium-Vitamin D-Vitamin K (CALCIUM + D) [phone] MG-UNT-MCG CHEW See admin instructions.     cetirizine (ZYRTEC ALLERGY) 10 MG tablet Take 1 tablet (10 mg total) by mouth daily. 30 tablet 0   citalopram (CELEXA) 20 MG tablet Take 1 tablet (20 mg total) by mouth daily. 30  tablet 2   clindamycin (CLEOCIN) 300 MG capsule Take 1 capsule (300 mg total) by mouth 3 (three) times daily. 30 capsule 0   lamoTRIgine (LAMICTAL) 100 MG tablet Take 1.5 tablets (150 mg total) by mouth daily. 30 tablet 1   levonorgestrel (MIRENA, 52 MG,) 20 MCG/24HR IUD 1 each by Intrauterine route continuous.     Multiple Vitamin (MULTIVITAMIN) tablet Take 1 tablet by mouth daily.     omeprazole (PRILOSEC) 40 MG capsule Take 40 mg by mouth 2 (two) times daily.     pseudoephedrine (SUDAFED) 60 MG tablet Take 1 tablet (60 mg total) by mouth every 8 (eight) hours as needed for congestion. 30 tablet 0   Vitamin D, Ergocalciferol, (DRISDOL) 1.25 MG (50000 UNIT) CAPS capsule Take 50,000 Units by mouth every 7 (seven) days.     No current facility-administered medications for this visit.     Musculoskeletal:    Psychiatric Specialty Exam: Review of Systems  There were no vitals taken for this visit.There is no height or weight on file to calculate BMI.  General Appearance: NA  Eye Contact:  NA  Speech:  Clear and Coherent  Volume:  Normal  Mood:  Anxious and Depressed  Affect:  Depressed  Thought Process:  Coherent  Orientation:  Full (Time, Place, and Person)  Thought Content: Logical   Suicidal Thoughts:  No  Homicidal Thoughts:  No  Memory:  Immediate;   Good Recent;   Good  Judgement:  Good  Insight:  Good  Psychomotor Activity:  Normal  Concentration:  Concentration: Good  Recall:  Good  Fund of Knowledge: Good  Language: Good  Akathisia:  No  Handed:  Right  AIMS (if indicated): done  Assets:  Communication Skills Desire for Improvement Social Support  ADL's:  Intact  Cognition: WNL  Sleep:  Fair   Screenings: AUDIT    Flowsheet Row Admission (Discharged) from 02/02/2016 in [facility] INPATIENT BE[facility]AVIORAL MEDICINE  Alcohol Use Disorder Identification Test Final Score (AUDIT) 1      GAD-7    Flowsheet Row Video Visit from 01/28/2021 in [facility] Video Visit from 12/25/2020 in [facility]  [facility]  Total GAD-7 Score 10 21 18 19       PHQ2-9    Flowsheet Row [facility]  PHQ-2 Total Score 3 3 4 2 4   PHQ-9 Total Score 13 15 19 11 20       Flowsheet Row [facility] [facility]  C-SSRS RISK CATEGORY Low Risk Low Risk No Risk        Assessment and Plan:  Amber GanserKellie Hofland is a 36 year old female with a charted history of depression, bipolar and anxiety disorder.  She reports mood instability.  States she does not feel that her medications have been effective.  She did report she has been off her medication for the past 2 weeks but felt like her mood has started deteriorating prior to her not being able to pick up her medications.  Medication adjustments to Lamictal and Wellbutrin was discussed.  Patient was receptive to plan.  Patient to follow-up 4 weeks for medication adherence and tolerability.  Discussed follow-up with ADHD symptoms.  Major depressive disorder Generalized anxiety disorder Bipolar disorder  Increase Lamictal 100 mg to 150 p.o. daily Decrease Wellbutrin 300 mg  to 150 mg  daily Continue Celexa 20 mg p.o. daily  Patient to follow-up 4 weeks for medication management  Amber Rackanika N Carnell Beavers, NP 04/01/2021, 1:03 PM

## 2021-04-02 ENCOUNTER — Telehealth (HOSPITAL_COMMUNITY): Payer: Self-pay | Admitting: Professional

## 2021-04-03 ENCOUNTER — Ambulatory Visit (INDEPENDENT_AMBULATORY_CARE_PROVIDER_SITE_OTHER): Payer: Medicaid Other | Admitting: Licensed Clinical Social Worker

## 2021-04-03 DIAGNOSIS — F3181 Bipolar II disorder: Secondary | ICD-10-CM | POA: Diagnosis not present

## 2021-04-03 NOTE — Progress Notes (Signed)
° °  THERAPIST PROGRESS NOTE  Virtual Visit via Video Note  I connected with [name] on 04/03/21 at 10:00 AM EST by a video enabled telemedicine application and verified that I am speaking with the correct person using two identifiers.  Location: Patient: [facility]  Provider: Provider Home.    I discussed the limitations of evaluation and management by telemedicine and the availability of in person appointments. The patient expressed understanding and agreed to proceed.      I discussed the assessment and treatment plan with the patient. The patient was provided an opportunity to ask questions and all were answered. The patient agreed with the plan and demonstrated an understanding of the instructions.   The patient was advised to call back or seek an in-person evaluation if the symptoms worsen or if the condition fails to improve as anticipated.  I provided 40 minutes of non-face-to-face time during this encounter.   Weber Cooks, LCSW   Participation Level: Active  Behavioral Response: CasualAlertAnxious and Depressed  Type of Therapy: Individual Therapy  Treatment Goals addressed: Anxiety and Coping  Interventions: CBT and Motivational Interviewing   Suicidal/Homicidal: Nowithout intent/plan  Therapist Response:    Pt was alert and oriented x 5. She was dressed casually and engaged well in therapy session. She was pleasant, cooperative, and maintained good eye contact. She presented with euphoric mood/affect.   Pt endorses symptoms for mood liability, mood swings, hopelessness, irritability, rapid thoughts. Pt also states she has ADHD symptoms for difficulty waiting turn and forgetfulness. Pt reports today that she would like to discuss her ADHD symptoms further with a medication provider. She stated that the NP she spoke with yesterday addressed it with her and she felt that it was something she wanted to address further. Pt reported that she was supposed  to have someone from administrative staff f/u with her, but no one ever did for a f/u with medication mgmt. in two weeks. LCSW Social worker to make them aware.   Other stressor for pt are relationship. She reports fighting with her partner due to a custody arrangement of one of his children. Pt reports that her significant other is fighting for full custody of a child that he believes is his but does not know for sure. Pt reported irritability and anger as she feels that her needs and wants to have not been addressed or discussed with her. Pt reports that when she attempted to talk to her significant other about this, she state vulgar things such as calling him retarted and I hope you got an STD from her. Pt does note that significant other did not know the pt at the time the relationship was going on.     Intervention/Plan: Pt to take medications as prescribed as two medications were update for pt to take higher dosages. Pt to continue to work on mood liability and decrease it from 1 x weekly to 1 monthly. Pt reports mood liability has improved from daily to weekly. LCSW utilized CBT, psychotherapy, and supportive therapy. LCSW educated pt on the signs and symptoms of hypomania, such as rapid thoughts, euphoria and over confidence.   Plan: Return again in 4 weeks.     Weber Cooks, LCSW 04/03/2021

## 2021-04-03 NOTE — Plan of Care (Signed)
Pt agreeable to plan  ?

## 2021-04-07 ENCOUNTER — Encounter (HOSPITAL_COMMUNITY): Payer: Self-pay | Admitting: Family

## 2021-04-07 MED ORDER — BUPROPION HCL ER (XL) 150 MG PO TB24: 150.0000 mg | ORAL_TABLET | Freq: Every day | ORAL | 0 refills | Status: DC

## 2021-04-25 ENCOUNTER — Ambulatory Visit (INDEPENDENT_AMBULATORY_CARE_PROVIDER_SITE_OTHER): Payer: Medicaid Other | Admitting: Licensed Clinical Social Worker

## 2021-04-25 DIAGNOSIS — F3181 Bipolar II disorder: Secondary | ICD-10-CM | POA: Diagnosis not present

## 2021-04-25 NOTE — Progress Notes (Signed)
THERAPIST PROGRESS NOTE   Virtual Visit via Video Note  I connected with Amber Nielsen on 04/25/21 at  9:00 AM EST by a video enabled telemedicine application and verified that I am speaking with the correct person using two identifiers.  Location: Patient: [facility]  Provider: [facility]    I discussed the limitations of evaluation and management by telemedicine and the availability of in person appointments. The patient expressed understanding and agreed to proceed.    I discussed the assessment and treatment plan with the patient. The patient was provided an opportunity to ask questions and all were answered. The patient agreed with the plan and demonstrated an understanding of the instructions.   The patient was advised to call back or seek an in-person evaluation if the symptoms worsen or if the condition fails to improve as anticipated.  I provided 40 minutes of non-face-to-face time during this encounter.   Weber Cooks, LCSW   Participation Level: Active  Behavioral Response: CasualAlertAnxious and Depressed  Type of Therapy: Individual Therapy  Treatment Goals addressed: have 4/7 "Happy days     ProgressTowards Goals: Progressing  Interventions: CBT, Motivational Interviewing, and Supportive  Summary: Amber Nielsen is a 36 y.o. female who presents with euthymic mood\affect.  Patient was pleasant, cooperative, and maintained good eye contact.  She engaged well in therapy session and was dressed casually.  Amber Nielsen was alert and oriented x5   Patient came in today for 11 minutes late for therapy session.  LCSW explained policies and procedures at [facility].  After LCSW had sent 2 links to patient's phone follow-up phone call was made at 9:11 AM and patient answered.  When patient entered the chat after answering her phone call LCSW explained policies and procedures that phone calls would not be reminded every  appointment and that patients are responsible for their own appointments.  Amber Nielsen was agreeable to plan and understands that no follow-up phone calls will be made the hour of the appointment moving forward.  Patient reported primary stressors today as being on time and motivation in the afternoons.  Patient reports that she has been taking all her medications as prescribed and mood has improved.  Patient reports having 5 "happy days" out of 7 the past 3 weeks.  Goal and objective for patient was at least 4 out of the 7 days/week happy days.  Patient states that she is having a problem sleeping in in the mornings.  And she is also having trouble doing things outside of sleeping and talking to her significant other for the afternoons.    Suicidal/Homicidal: Nowithout intent/plan  Therapist Response:     Intervention/Plan: LCSW administered GAD-7 and PHQ-9.  LCSW will note that this patient scored in moderate level of anxiety and moderate level of depression for today's session.  Patient reporting 5 out of 7 days "for happy days".  Goal was 4 out of 7 days.  Patient is currently attending 80% of therapy sessions at this time.  LCSW administered open-ended questions, positive affirmations and reflective listening for motivational interviewing.  LCSW utilized cognitive behavioral therapy for education for taking medications and possible side effects of those medications being directed to the medication provider.  Plan for patient is to continue with at least 4 out of 7 "happy days".  Patient to increase on time attendance at work at least 4 out of the 7 days/week.  Patient will do this by setting alarm clock earlier or downloading  applications that requires problem solving skills to dismiss the alarm.  LCSW provided patient with a free application for extreme alarm clocks which is an application that can be downloaded on her phone.  Plan: Return again in 4 weeks.  Diagnosis: Bipolar 2 disorder  (HCC)  Collaboration of Care: Other None for this session   Patient/Guardian was advised Release of Information must be obtained prior to any record release in order to collaborate their care with an outside provider. Patient/Guardian was advised if they have not already done so to contact the registration department to sign all necessary forms in order for [place] to release information regarding their care.   Consent: Patient/Guardian gives verbal consent for treatment and assignment of benefits for services provided during this visit. Patient/Guardian expressed understanding and agreed to proceed.   Weber Cooks, LCSW 04/25/2021

## 2021-04-25 NOTE — Plan of Care (Signed)
Pt reports 5/7 for "happy days". PHQ-9 and GAD-7 administered in today's session

## 2021-05-23 ENCOUNTER — Ambulatory Visit (INDEPENDENT_AMBULATORY_CARE_PROVIDER_SITE_OTHER): Payer: Medicaid Other | Admitting: Licensed Clinical Social Worker

## 2021-05-23 DIAGNOSIS — F3181 Bipolar II disorder: Secondary | ICD-10-CM

## 2021-05-23 NOTE — Progress Notes (Signed)
? ?  THERAPIST PROGRESS NOTE ?Virtual Visit via Video Note ? ?I connected with Amber Nielsen on 05/23/21 at  8:00 AM EDT by a video enabled telemedicine application and verified that I am speaking with the correct person using two identifiers. ? ?Location: ?Patient: Amber Nielsen  ?Provider: Providers Home  ?  ?I discussed the limitations of evaluation and management by telemedicine and the availability of in person appointments. The patient expressed understanding and agreed to proceed. ? ? ?  ?I discussed the assessment and treatment plan with the patient. The patient was provided an opportunity to ask questions and all were answered. The patient agreed with the plan and demonstrated an understanding of the instructions. ?  ?The patient was advised to call back or seek an in-person evaluation if the symptoms worsen or if the condition fails to improve as anticipated. ? ?I provided 45 minutes of non-face-to-face time during this encounter. ? ? ?Amber Cooks, LCSW  ? ?Participation Level: Active ? ?Behavioral Response: CasualAlertAnxious and Depressed ? ?Type of Therapy: Individual Therapy ? ?Treatment Goals addressed:  ? ?ProgressTowards Goals: Progressing ? ?Interventions: CBT, Motivational Interviewing, and Supportive ? ? ? ?Suicidal/Homicidal: Nowithout intent/plan ? ?Therapist Response:  ?Pt was alert and oriented x 5. She was dressed casually and engaged well in therapy session. Pt presented with depressed and anxious mood/affect. She was pleasant cooperative and maintained good eye contact.  ? Pt reports primary stressors is relationship with her best friend. Pt reports that she allowed her best friend back in her life. Pt reports anger, tension, and concern. She reports that her friend is shallow and aways tries to top her with presents to her children. Pt reports resentment for her success on Tik[employer] and on ?only fans?Amber Nielsen states she has set clear boundaries.   ? Interventions/Plan: LCSW administer  PHQ-9 and pt decrease her score by 1 to an 11. Pt goal/objective is below 10. Goal is progressing. LCSW used person center, supportive, and CB therapy in today's session. LCSW used language for praise and encouragement. LCSW used empowerment. LCSW educated pt on boundaries setting and advocacy. Plan for pt is to f/u in 4 weeks for monthly sessions.  ?  ?  ? ?Plan: Return again in 4 weeks. ? ?Diagnosis: Bipolar 2 disorder (HCC) ? ?Collaboration of Care: Other   ? ?Patient/Guardian was advised Release of Information must be obtained prior to any record release in order to collaborate their care with an outside provider. Patient/Guardian was advised if they have not already done so to contact the registration department to sign all necessary forms in order for [place] to release information regarding their care.  ? ?Consent: Patient/Guardian gives verbal consent for treatment and assignment of benefits for services provided during this visit. Patient/Guardian expressed understanding and agreed to proceed.  ? ?Amber Cooks, LCSW ?05/23/2021 ? ?

## 2021-06-16 ENCOUNTER — Encounter (HOSPITAL_COMMUNITY): Payer: Self-pay

## 2021-06-16 ENCOUNTER — Ambulatory Visit (INDEPENDENT_AMBULATORY_CARE_PROVIDER_SITE_OTHER): Payer: Medicaid Other | Admitting: Licensed Clinical Social Worker

## 2021-06-16 DIAGNOSIS — F3181 Bipolar II disorder: Secondary | ICD-10-CM

## 2021-06-16 NOTE — Progress Notes (Signed)
? ?  THERAPIST PROGRESS NOTE ? ?Virtual Visit via Video Note ? ?I connected with [name] on 06/16/21 at  1:00 PM EDT by a video enabled telemedicine application and verified that I am speaking with the correct person using two identifiers. ? ?Location: ?Patient: [facility]  ?Provider: [facility]iders Home  ?  ?I discussed the limitations of evaluation and management by telemedicine and the availability of in person appointments. The patient expressed understanding and agreed to proceed. ? ? ? ?  ?I discussed the assessment and treatment plan with the patient. The patient was provided an opportunity to ask questions and all were answered. The patient agreed with the plan and demonstrated an understanding of the instructions. ?  ?The patient was advised to call back or seek an in-person evaluation if the symptoms worsen or if the condition fails to improve as anticipated. ? ?I provided 30 minutes of non-face-to-face time during this encounter. ? ? ?Weber Cooks, LCSW  ? ?Participation Level: Active ? ?Behavioral Response: CasualAlertAnxious and Depressed ? ?Type of Therapy: Individual Therapy ? ?Treatment Goals addressed: decrease PHQ-9 below 10 and GAD-7 below 5  ? ?ProgressTowards Goals: Progressing ? ?Interventions: CBT and Motivational Interviewing ? ? ?Suicidal/Homicidal: Nowithout intent/plan ? ?Therapist Response:  ? ?Pt was alert and oriented x 5. She was dressed casually and engaged well in therapy session. She presented with a depressed and anxious mood/affect. Vylet was pleasant, cooperative, and maintained good eye contact.  ? Pt primary stressor is work. She reports that she took a few days off to better manage her mental health. She states that she has been having lack of motivation to accomplish her tasks and felt a few days would help decrease her depression and anxiety. She reports that overall, she has been doing a better job of self-awareness of signs and symptoms related to her mental  health. She is taking her medications as prescribed.  ? Intervention: LCSW administered GAD-7 pt decrease score by two points. LCSW administered a PHQ-9 a pt decreased score by two points. LCSW used intervention to explain signs and symptoms for bipolar disorder. LCSW educated pt on symptoms to meet diagnostic criteria for bipolar disorder. LCSW used supportive therapy for praise and encouragement.  ? ?Plan: Return again in 4 weeks. ? ?Diagnosis: Bipolar 2 disorder (HCC) ? ?Collaboration of Care: Other none today  ? ?Patient/Guardian was advised Release of Information must be obtained prior to any record release in order to collaborate their care with an outside provider. Patient/Guardian was advised if they have not already done so to contact the registration department to sign all necessary forms in order for [place] to release information regarding their care.  ? ?Consent: Patient/Guardian gives verbal consent for treatment and assignment of benefits for services provided during this visit. Patient/Guardian expressed understanding and agreed to proceed.  ? ?Weber Cooks, LCSW ?06/16/2021 ? ?

## 2021-06-16 NOTE — Plan of Care (Signed)
?  Problem: Bipolar Disorder CCP Problem  1 bipolar 2  ?Goal: have 4/7 "Happy days"  ?Outcome: Progressing ?Goal: STG: Amber Nielsen WILL IDENTIFY COGNITIVE PATTERNS AND BELIEFS THAT INTERFERE WITH THERAPY ?Outcome: Progressing ?Goal: STG: Amber Nielsen WILL ATTEND AT LEAST 80% OF SCHEDULED FOLLOW-UP MEDICATION MANAGEMENT APPOINTMENTS ?Outcome: Progressing ?Goal: Decrease PHQ-9 below 10  ?Outcome: Progressing ?Goal: Decrease GAD-7 below 5  ?Outcome: Progressing ?Intervention: WORK WITH Amber Nielsen TO DISCUSS RISKS AND BENEFITS OF MEDICATION TREATMENT OPTIONS FOR THIS PROBLEM AND PRESCRIBE AS INDICATED ?Intervention: REVIEW WITH Amber Nielsen THEIR RESPONSE TO THE PRESCRIBED MEDICATION, INCLUDING ANY SIDE EFFECTS ?Intervention: INSTRUCT Amber Nielsen TO TAKE PSYCHOTROPIC MEDICATION AS PRESCRIBED ?Intervention: EDUCATE Amber Nielsen ON BIPOLAR DISORDER DIAGNOSTIC CRITERIA ?Intervention: WORK WITH Amber Nielsen TO DEVELOP A LIST OF AT LEAST 1 CONSEQUENCES OF UNTREATED BIPOLAR SYMPTOMS ?Intervention: WORK WITH Amber Nielsen TO DEVELOP AT LEAST A LIST OF 1 TRIGGER FOR A MANIC EPISODE ?Intervention: WORK WITH Amber Nielsen TO IDENTIFY AT LEAST 1 FALSE BELIEFS ABOUT THE BENEFITS OF MANIA, AND WRITE REFRAMING STATEMENTS TO REPLACE THEM ?  ?Problem: Bipolar Disorder CCP Problem  1 bipolar 2  ?Intervention: WORK WITH Amber Nielsen TO DISCUSS RISKS AND BENEFITS OF MEDICATION TREATMENT OPTIONS FOR THIS PROBLEM AND PRESCRIBE AS INDICATED ?  ?Problem: Bipolar Disorder CCP Problem  1 bipolar 2  ?Intervention: REVIEW WITH Amber Nielsen THEIR RESPONSE TO THE PRESCRIBED MEDICATION, INCLUDING ANY SIDE EFFECTS ?  ?Problem: Bipolar Disorder CCP Problem  1 bipolar 2  ?Intervention: INSTRUCT Amber Nielsen TO TAKE PSYCHOTROPIC MEDICATION AS PRESCRIBED ?  ?Problem: Bipolar Disorder CCP Problem  1 bipolar 2  ?Intervention: EDUCATE Amber Nielsen ON BIPOLAR DISORDER DIAGNOSTIC CRITERIA ?  ?Problem: Bipolar Disorder CCP Problem  1 bipolar 2  ?Intervention: WORK WITH Amber Nielsen TO DEVELOP A LIST OF AT LEAST 1 CONSEQUENCES OF UNTREATED  BIPOLAR SYMPTOMS ?  ?Problem: Bipolar Disorder CCP Problem  1 bipolar 2  ?Intervention: WORK WITH Amber Nielsen TO DEVELOP AT LEAST A LIST OF 1 TRIGGER FOR A MANIC EPISODE ?  ?Problem: Bipolar Disorder CCP Problem  1 bipolar 2  ?Intervention: WORK WITH Amber Nielsen TO IDENTIFY AT LEAST 1 FALSE BELIEFS ABOUT THE BENEFITS OF MANIA, AND WRITE REFRAMING STATEMENTS TO REPLACE THEM ?  ?

## 2021-06-18 ENCOUNTER — Telehealth (INDEPENDENT_AMBULATORY_CARE_PROVIDER_SITE_OTHER): Payer: Medicaid Other | Admitting: Physician Assistant

## 2021-06-18 ENCOUNTER — Encounter (HOSPITAL_COMMUNITY): Payer: Self-pay | Admitting: Physician Assistant

## 2021-06-18 DIAGNOSIS — F3181 Bipolar II disorder: Secondary | ICD-10-CM | POA: Diagnosis not present

## 2021-06-18 DIAGNOSIS — R41840 Attention and concentration deficit: Secondary | ICD-10-CM | POA: Diagnosis not present

## 2021-06-18 DIAGNOSIS — F39 Unspecified mood [affective] disorder: Secondary | ICD-10-CM

## 2021-06-18 MED ORDER — ATOMOXETINE HCL 40 MG PO CAPS: 40.0000 mg | ORAL_CAPSULE | Freq: Every day | ORAL | 2 refills | Status: DC

## 2021-06-18 MED ORDER — BUPROPION HCL ER (XL) 300 MG PO TB24: 300.0000 mg | ORAL_TABLET | Freq: Every day | ORAL | 2 refills | Status: DC

## 2021-06-18 MED ORDER — CITALOPRAM HYDROBROMIDE 20 MG PO TABS: 20.0000 mg | ORAL_TABLET | Freq: Every day | ORAL | 2 refills | Status: AC

## 2021-06-18 MED ORDER — LAMOTRIGINE 150 MG PO TABS: 150.0000 mg | ORAL_TABLET | Freq: Every day | ORAL | 2 refills | Status: DC

## 2021-06-18 NOTE — Progress Notes (Addendum)
BH MD/PA/NP OP Progress Note ? ?Virtual Visit via Video Note ? ?I connected with Amber Nielsen on 06/18/21 at  3:00 PM EDT by a video enabled telemedicine application and verified that I am speaking with the correct person using two identifiers. ? ?Location: ?Patient: Home ?Provider: Clinic ?  ?I discussed the limitations of evaluation and management by telemedicine and the availability of in person appointments. The patient expressed understanding and agreed to proceed. ? ?Follow Up Instructions: ?  ?I discussed the assessment and treatment plan with the patient. The patient was provided an opportunity to ask questions and all were answered. The patient agreed with the plan and demonstrated an understanding of the instructions. ?  ?The patient was advised to call back or seek an in-person evaluation if the symptoms worsen or if the condition fails to improve as anticipated. ? ?I provided 21 minutes of non-face-to-face time during this encounter. ? ?Meta HatchetUchenna E Tyia Binford, PA  ? ? ?06/18/2021 9:56 PM ?Amber GanserKellie Wroblewski  ?MRN:  [id number] ? ?Chief Complaint:  ?Chief Complaint  ?Patient presents with  ? Follow-up  ? Medication Management  ? ?HPI:  ? ?Amber Nielsen is a 36 year old female with a past psychiatric history significant for attention and concentration deficit, bipolar 2 disorder, and unspecified mood (affective disorder) [facility] via virtual video visit for follow-up and medication management.  Patient was last seen by Hillery Jacksanika Lewis, NP on 04/01/2021.  Patient is currently being managed on the following medications: ? ?Lamictal 150 mg daily ?Bupropion (Wellbutrin XL) 150 mg 24-hour tablet daily ?Celexa 20 mg daily ? ?Patient was asked how her experience with Wellbutrin 150 mg daily was going to which she replied that she was still taking 300 mg daily.  Patient endorses some irritability and feeling more pissy but attributes her symptoms to having issues with PMS.   Patient continues to endorse fluctuating anxiety.  She also reports having issues with energy and getting tasks done.  She reports that she experiences fatigue throughout the whole day.  Patient notes that she has been diagnosed with ADD and ADHD.  She has noticed lack of focus and concentration but denies fluctuations in her mood. ? ?Patient denies depressive episodes but endorses anxiety she rates at a 6 or 7 out of 10.  She reports that normally her anxiety is low but thinks that her anxiety is due to a number of stressors in her life.  Her anxiety is alleviated through coping skills that she has learned from her licensed clinical Child psychotherapistsocial worker.  Patient reports that she utilizes various techniques in order to eliminate her anxiety.  In regards to her past history of ADD and ADHD, patient reports that she has a history of taking Ritalin for the management of her symptoms.  Patient endorses the following symptoms related to ADHD: Issues with completing tasks (dishes and laundry), unable to maintain attention, various activities in a state of progress, and being late to work every day.  Due to her current symptoms related to ADHD, patient states that she had to be given a specially tailored job at work.  A PHQ-9 screen was performed with the patient scoring a 6.  A GAD-7 screen was also performed with the patient going to 6. ? ?Patient is alert and oriented x4, calm, cooperative, and fully engaged in conversation during the encounter.  Patient reports that she feels good and endorses neutral mood.  Patient denies suicidal or homicidal ideations.  She further denies  auditory or visual hallucinations and does not appear to be responding to internal/external stimuli.  Patient endorses good sleep and receives on average 6 to 7 hours of sleep each night.  Patient endorses good appetite and states that she eats 3 meals a day broken into smaller meals throughout the day.  Patient denies alcohol consumption and illicit  drug use.  Patient endorses tobacco use and smokes on average 1/2 pack/day. ? ?Visit Diagnosis:  ?  ICD-10-CM   ?1. Attention and concentration deficit  R41.840 atomoxetine (STRATTERA) 40 MG capsule  ?  ?2. Bipolar 2 disorder (HCC)  F31.81 buPROPion (WELLBUTRIN XL) 300 MG 24 hr tablet  ?  lamoTRIgine (LAMICTAL) 150 MG tablet  ?  citalopram (CELEXA) 20 MG tablet  ?  ?3. Unspecified mood (affective) disorder (HCC)  F39 lamoTRIgine (LAMICTAL) 150 MG tablet  ?  citalopram (CELEXA) 20 MG tablet  ?  ? ? ?Past Psychiatric History:  ?Bipolar 2 disorder ?Unspecified mood disorder ? ?Past Medical History:  ?Past Medical History:  ?Diagnosis Date  ? Acute pyelonephritis   ? Anxiety   ? Asthma   ? allergy related. rodent animals  ? Bipolar 1 disorder (HCC)   ? Depression   ? H/O varicella   ? Headache(784.0)   ? History of gestational diabetes mellitus (GDM) 01/12/2017  ? History of pre-eclampsia 01/12/2017  ? HTN (hypertension)   ? IBS (irritable bowel syndrome)   ? Obesity   ? Pregnancy induced hypertension   ? Sepsis secondary to UTI (HCC) 08/26/2016  ? Ureteral stone with hydronephrosis 08/27/2016  ?  ?Past Surgical History:  ?Procedure Laterality Date  ? COLONOSCOPY    ? CYSTOSCOPY W/ URETERAL STENT PLACEMENT Left 08/26/2016  ? Procedure: CYSTOSCOPY WITH RETROGRADE PYELOGRAM/URETERAL STENT PLACEMENT;  Surgeon: Marcine Matar, MD;  Location: [facility] OR;  Service: Urology;  Laterality: Left;  ? CYSTOSCOPY WITH RETROGRADE PYELOGRAM, URETEROSCOPY AND STENT PLACEMENT Left 09/14/2016  ? Procedure: CYSTOSCOPY WITH URETEROSCOPY , STONE EXTRACTION AND STENT REMOVAL;  Surgeon: Marcine Matar, MD;  Location: [facility] ORS;  Service: Urology;  Laterality: Left;  ? GASTRIC BYPASS    ? TOOTH EXTRACTION    ? ? ?Family Psychiatric History:  ?Mother - patient reports that her mother had several psychiatric illnesses ranging from major depressive disorder, bipolar depression, and schizophrenia. ? ?Family History:  ?Family History  ?Problem Relation  Age of Onset  ? Heart disease Father   ? Heart attack Father   ? Nephrolithiasis Father   ? Colon polyps Father   ? Healthy Mother   ? Mental retardation Maternal Aunt   ? Diabetes Maternal Grandmother   ? Cancer Maternal Grandmother   ?     ovarian and breast  ? Diabetes Maternal Grandfather   ? Diabetes Paternal Grandmother   ? Hypertension Paternal Grandmother   ? Heart disease Paternal Grandmother   ? Colon polyps Paternal Grandmother   ? Diabetes Paternal Grandfather   ? Colon polyps Paternal Uncle   ? ? ?Social History:  ?Social History  ? ?Socioeconomic History  ? Marital status: Significant Other  ?  Spouse name: Not on file  ? Number of children: 2  ? Years of education: Not on file  ? Highest education level: Not on file  ?Occupational History  ? Occupation: hair stylist  ?Tobacco Use  ? Smoking status: Every Day  ?  Packs/day: 0.50  ?  Types: Cigarettes  ? Smokeless tobacco: Never  ?Vaping Use  ? Vaping Use: Never used  ?Substance  and Sexual Activity  ? Alcohol use: Yes  ?  Comment: socially  ? Drug use: Yes  ?  Types: Marijuana  ?  Comment: daily use or  smoke 1/8 weekly  ? Sexual activity: Yes  ?  Partners: Male  ?  Birth control/protection: I.U.D.  ?Other Topics Concern  ? Not on file  ?Social History Narrative  ? Not on file  ? ?Social Determinants of Health  ? ?Financial Resource Strain: Low Risk   ? Difficulty of Paying Living Expenses: Not very hard  ?Food Insecurity: Food Insecurity Present  ? Worried About Programme researcher, broadcasting/film/video in the Last Year: Sometimes true  ? Ran Out of Food in the Last Year: Sometimes true  ?Transportation Needs: No Transportation Needs  ? Lack of Transportation (Medical): No  ? Lack of Transportation (Non-Medical): No  ?Physical Activity: Inactive  ? Days of Exercise per Week: 0 days  ? Minutes of Exercise per Session: 0 min  ?Stress: Stress Concern Present  ? Feeling of Stress : Very much  ?Social Connections: Socially Isolated  ? Frequency of Communication with Friends and  Family: Never  ? Frequency of Social Gatherings with Friends and Family: Once a week  ? Attends Religious Services: Never  ? Active Member of Clubs or Organizations: No  ? Attends Water quality scientist

## 2021-07-06 ENCOUNTER — Other Ambulatory Visit (HOSPITAL_COMMUNITY): Payer: Self-pay | Admitting: Physician Assistant

## 2021-07-06 DIAGNOSIS — F3181 Bipolar II disorder: Secondary | ICD-10-CM

## 2021-07-06 DIAGNOSIS — F39 Unspecified mood [affective] disorder: Secondary | ICD-10-CM

## 2021-07-14 ENCOUNTER — Encounter (HOSPITAL_COMMUNITY): Payer: Self-pay

## 2021-07-14 ENCOUNTER — Ambulatory Visit (HOSPITAL_COMMUNITY): Payer: Medicaid Other | Admitting: Licensed Clinical Social Worker

## 2021-07-14 ENCOUNTER — Telehealth (HOSPITAL_COMMUNITY): Payer: Self-pay | Admitting: Licensed Clinical Social Worker

## 2021-07-14 NOTE — Telephone Encounter (Signed)
Spoke with pt after not entering virtual session. Pt reports that she was at work and her manager was not available to cover for her. She states that she was feeling well these past few weeks and believes that she maybe ready for discharge from therapy only. LCSW kept appointment for May 21 st to discuss further.  ?

## 2021-08-05 ENCOUNTER — Other Ambulatory Visit (HOSPITAL_COMMUNITY): Payer: Self-pay | Admitting: Physician Assistant

## 2021-08-05 DIAGNOSIS — F3181 Bipolar II disorder: Secondary | ICD-10-CM

## 2021-08-06 ENCOUNTER — Ambulatory Visit (INDEPENDENT_AMBULATORY_CARE_PROVIDER_SITE_OTHER): Payer: Medicaid Other | Admitting: Licensed Clinical Social Worker

## 2021-08-06 DIAGNOSIS — F3181 Bipolar II disorder: Secondary | ICD-10-CM

## 2021-08-06 NOTE — Progress Notes (Signed)
   THERAPIST PROGRESS NOTE  Session Time: 30  Virtual Visit via Video Note  I connected with Amber Nielsen on 08/06/21 at  3:00 PM EDT by a video enabled telemedicine application and verified that I am speaking with the correct person using two identifiers.  Location: Patient: [facility]  Provider: Providers Home    I discussed the limitations of evaluation and management by telemedicine and the availability of in person appointments. The patient expressed understanding and agreed to proceed.      I discussed the assessment and treatment plan with the patient. The patient was provided an opportunity to ask questions and all were answered. The patient agreed with the plan and demonstrated an understanding of the instructions.   The patient was advised to call back or seek an in-person evaluation if the symptoms worsen or if the condition fails to improve as anticipated.  I provided 30 minutes of non-face-to-face time during this encounter.   Weber Cooks, LCSW   Participation Level: Active  Behavioral Response: CasualAlertAnxious  Type of Therapy: Individual Therapy  Treatment Goals addressed:   ProgressTowards Goals: Progressing  Interventions: CBT and Motivational Interviewing  Summary: Amber Nielsen is a 36 y.o. female who presents with hypomanic mood\affect.  Patient presented today with rapid speech and gentle thought process and anxiety.  Patient was alert and oriented x5.  Patient was pleasant, cooperative, maintained good eye contact.  Patient comes in today for final session.  LCSW spoke with patient about patient wanting to discontinue therapy moving forward.  Patient stated that "I change my mind and think that therapy would be a good time monthly."  LCSW spoke with patient and stated that he was agreeable to see patient 1 time monthly.  Patient reports primary stressors as her children's father.  Patient reports that he is making decisions that that could  be putting the children at risk.  Patient reports that he spends time at a known pedophiles house.  Patient reports that he does not leave the children alone with him but patient feels that this is very irresponsible.  Patient reports that she is going to move forward with making a report and is seeking out therapy services for her children.   Suicidal/Homicidal: Nowithout intent/plan  Therapist Response:    Intervention/Plan: LCSW utilized supportive therapy, person centered therapy, and motivational interviewing.  LCSW utilized open-ended questions, reflective listening, and positive affirmations.  LCSW utilized praise and encouragement for supportive therapy and empowerment for person centered therapy.  Plan: Return again in 4 weeks.  Diagnosis: Bipolar 2 disorder (HCC)  Collaboration of Care: Other None in today's session.   Patient/Guardian was advised Release of Information must be obtained prior to any record release in order to collaborate their care with an outside provider. Patient/Guardian was advised if they have not already done so to contact the registration department to sign all necessary forms in order for [place] to release information regarding their care.   Consent: Patient/Guardian gives verbal consent for treatment and assignment of benefits for services provided during this visit. Patient/Guardian expressed understanding and agreed to proceed.   Weber Cooks, LCSW 08/06/2021

## 2021-08-06 NOTE — Plan of Care (Signed)
  Problem: Bipolar Disorder CCP Problem  1 bipolar 2  Goal: have 4/7 "Happy days"  Outcome: Progressing Goal: STG: Amber Nielsen WILL IDENTIFY COGNITIVE PATTERNS AND BELIEFS THAT INTERFERE WITH THERAPY Outcome: Progressing Goal: STG: Amber Nielsen WILL ATTEND AT LEAST 80% OF SCHEDULED FOLLOW-UP MEDICATION MANAGEMENT APPOINTMENTS Outcome: Progressing   Problem: Bipolar Disorder CCP Problem  1 bipolar 2  Goal: Decrease PHQ-9 below 10  Outcome: Not Progressing Goal: Decrease GAD-7 below 5  Outcome: Not Progressing

## 2021-08-20 ENCOUNTER — Telehealth (HOSPITAL_COMMUNITY): Payer: Medicaid Other | Admitting: Physician Assistant

## 2021-08-20 ENCOUNTER — Encounter (HOSPITAL_COMMUNITY): Payer: Self-pay

## 2021-09-25 ENCOUNTER — Ambulatory Visit (HOSPITAL_COMMUNITY): Payer: Medicaid Other | Admitting: Licensed Clinical Social Worker

## 2021-09-25 ENCOUNTER — Encounter (HOSPITAL_COMMUNITY): Payer: Self-pay

## 2021-09-25 ENCOUNTER — Telehealth (HOSPITAL_COMMUNITY): Payer: Self-pay | Admitting: Licensed Clinical Social Worker

## 2021-09-25 NOTE — Telephone Encounter (Signed)
LCSW sent two links to pt phone with no response. LCSW f/u with a phone call at [phone] and [phone] with no answer and no VM. Pt will be marked as a no show

## 2021-09-29 NOTE — Telephone Encounter (Signed)
Pt was a no show for this session

## 2021-09-30 ENCOUNTER — Encounter: Payer: Self-pay | Admitting: Emergency Medicine

## 2021-09-30 ENCOUNTER — Ambulatory Visit
Admission: EM | Admit: 2021-09-30 | Discharge: 2021-09-30 | Disposition: A | Discharge status: Home / Self Care | Payer: Medicaid Other | Attending: Emergency Medicine | Admitting: Emergency Medicine

## 2021-09-30 DIAGNOSIS — J0100 Acute maxillary sinusitis, unspecified: Secondary | ICD-10-CM

## 2021-09-30 DIAGNOSIS — J029 Acute pharyngitis, unspecified: Secondary | ICD-10-CM

## 2021-09-30 LAB — POCT RAPID STREP A (OFFICE): Rapid Strep A Screen: NEGATIVE

## 2021-09-30 MED ORDER — DOXYCYCLINE HYCLATE 100 MG PO CAPS: 100.0000 mg | ORAL_CAPSULE | Freq: Two times a day (BID) | ORAL | 0 refills | Status: AC

## 2021-09-30 NOTE — ED Provider Notes (Signed)
Renaldo Fiddler    CSN: [id number] Arrival date & time: 09/30/21  1254      History   Chief Complaint Chief Complaint  Patient presents with   Sore Throat   Nasal Congestion    HPI Amber Nielsen is a 36 y.o. female.  Patient presents with >1 week history of sore throat, sinus congestion, postnasal drip, sinus pressure, productive cough.  She has been taking leftover amoxicillin and prednisone that were prescribed for previous illness; she has not been taking at therapeutic doses.  She also has been taking various OTC cold and sinus medications.  No fever, chills, chest pain, shortness of breath, vomiting, diarrhea, or other symptoms.  Her medical history includes hypertension, asthma, IBS, kidney stone, pyelonephritis, bipolar disorder, depression, anxiety, gastric bypass.  The history is provided by the patient and medical records.    Past Medical History:  Diagnosis Date   Acute pyelonephritis    Anxiety    Asthma    allergy related. rodent animals   Bipolar 1 disorder (HCC)    Depression    H/O varicella    Headache(784.0)    History of gestational diabetes mellitus (GDM) 01/12/2017   History of pre-eclampsia 01/12/2017   HTN (hypertension)    IBS (irritable bowel syndrome)    Obesity    Pregnancy induced hypertension    Sepsis secondary to UTI (HCC) 08/26/2016   Ureteral stone with hydronephrosis 08/27/2016    Patient Active Problem List   Diagnosis Date Noted   Attention and concentration deficit 06/18/2021   Unspecified mood (affective) disorder (HCC) 10/25/2020   Bipolar 2 disorder (HCC) 09/30/2020   Tobacco abuse 02/17/2017   Major depressive disorder, 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   Cannabis abuse 02/02/2016   Morbid obesity (HCC) 12/15/2012    Past Surgical History:  Procedure Laterality Date   COLONOSCOPY     CYSTOSCOPY W/ URETERAL STENT PLACEMENT Left 08/26/2016   Procedure: CYSTOSCOPY WITH RETROGRADE  PYELOGRAM/URETERAL STENT PLACEMENT;  Surgeon: Marcine Matar, MD;  Location: [facility] OR;  Service: Urology;  Laterality: Left;   CYSTOSCOPY WITH RETROGRADE PYELOGRAM, URETEROSCOPY AND STENT PLACEMENT Left 09/14/2016   Procedure: CYSTOSCOPY WITH URETEROSCOPY , STONE EXTRACTION AND STENT REMOVAL;  Surgeon: Marcine Matar, MD;  Location: [facility] ORS;  Service: Urology;  Laterality: Left;   GASTRIC BYPASS     TOOTH EXTRACTION      OB History     Gravida  4   Para  2   Term  2   Preterm  0   AB  2   Living  2      SAB  2   IAB  0   Ectopic  0   Multiple  0   Live Births  2            Home Medications    Prior to Admission medications   Medication Sig Start Date End Date Taking? Authorizing Provider  doxycycline (VIBRAMYCIN) 100 MG capsule Take 1 capsule (100 mg total) by mouth 2 (two) times daily for 7 days. 09/30/21 10/07/21 Yes Mickie Bail, NP  albuterol (PROVENTIL HFA;VENTOLIN HFA) 108 (90 Base) MCG/ACT inhaler Inhale 2 puffs into the lungs every 6 (six) hours as needed for wheezing or shortness of breath.    [provider]  atomoxetine (STRATTERA) 40 MG capsule Take 1 capsule (40 mg total) by mouth daily. 06/18/21 06/18/22  Nwoko, Tommas Olp, PA  buPROPion (WELLBUTRIN XL) 300 MG 24 hr tablet TAKE 1 TABLET(300  MG) BY MOUTH DAILY 08/06/21   Nwoko, Tommas Olp, PA  Calcium-Vitamin D-Vitamin K (CALCIUM + D) [phone] MG-UNT-MCG CHEW See admin instructions.    [provider]  cetirizine (ZYRTEC ALLERGY) 10 MG tablet Take 1 tablet (10 mg total) by mouth daily. 08/13/20   Wallis Bamberg, PA-C  citalopram (CELEXA) 20 MG tablet Take 1 tablet (20 mg total) by mouth daily. 06/18/21   Nwoko, Tommas Olp, PA  clindamycin (CLEOCIN) 300 MG capsule Take 1 capsule (300 mg total) by mouth 3 (three) times daily. 08/13/20   Wallis Bamberg, PA-C  lamoTRIgine (LAMICTAL) 150 MG tablet Take 1 tablet (150 mg total) by mouth daily. 06/18/21 06/18/22  Nwoko, Tommas Olp, PA  levonorgestrel (MIRENA,  52 MG,) 20 MCG/24HR IUD 1 each by Intrauterine route continuous.    [provider]  Multiple Vitamin (MULTIVITAMIN) tablet Take 1 tablet by mouth daily.    [provider]  omeprazole (PRILOSEC) 40 MG capsule Take 40 mg by mouth 2 (two) times daily. 07/20/19   [provider]  pseudoephedrine (SUDAFED) 60 MG tablet Take 1 tablet (60 mg total) by mouth every 8 (eight) hours as needed for congestion. 08/13/20   Wallis Bamberg, PA-C  Vitamin D, Ergocalciferol, (DRISDOL) 1.25 MG (50000 UNIT) CAPS capsule Take 50,000 Units by mouth every 7 (seven) days.    [provider]    Family History Family History  Problem Relation Age of Onset   Heart disease Father    Heart attack Father    Nephrolithiasis Father    Colon polyps Father    Healthy Mother    Mental retardation Maternal Aunt    Diabetes Maternal Grandmother    Cancer Maternal Grandmother        ovarian and breast   Diabetes Maternal Grandfather    Diabetes Paternal Grandmother    Hypertension Paternal Grandmother    Heart disease Paternal Grandmother    Colon polyps Paternal Grandmother    Diabetes Paternal Grandfather    Colon polyps Paternal Uncle     Social History Social History   Tobacco Use   Smoking status: Every Day    Packs/day: 0.50    Types: Cigarettes   Smokeless tobacco: Never  Vaping Use   Vaping Use: Never used  Substance Use Topics   Alcohol use: Yes    Comment: socially   Drug use: Yes    Types: Marijuana    Comment: daily use or  smoke 1/8 weekly     Allergies   Metformin and related, Celery oil, Cymbalta [duloxetine hcl], Lamictal [lamotrigine], and Prozac [fluoxetine hcl]   Review of Systems Review of Systems  Constitutional:  Negative for chills and fever.  HENT:  Positive for congestion, ear pain, postnasal drip, rhinorrhea, sinus pressure and sore throat.   Respiratory:  Positive for cough. Negative for shortness of breath.   Cardiovascular:  Negative for  chest pain and palpitations.  Gastrointestinal:  Negative for diarrhea and vomiting.  Skin:  Negative for color change and rash.  All other systems reviewed and are negative.    Physical Exam Triage Vital Signs ED Triage Vitals  Enc Vitals Group     BP      Pulse      Resp      Temp      Temp src      SpO2      Weight      Height      Head Circumference      Peak  Flow      Pain Score      Pain Loc      Pain Edu?      Excl. in GC?    No data found.  Updated Vital Signs BP 140/87   Pulse 62   Temp 99.2 F (37.3 C) (Oral)   Resp 20   SpO2 98%   Visual Acuity Right Eye Distance:   Left Eye Distance:   Bilateral Distance:    Right Eye Near:   Left Eye Near:    Bilateral Near:     Physical Exam Vitals and nursing note reviewed.  Constitutional:      General: She is not in acute distress.    Appearance: Normal appearance. She is well-developed. She is not ill-appearing.  HENT:     Right Ear: Tympanic membrane normal.     Left Ear: Tympanic membrane normal.     Nose: Congestion present.     Mouth/Throat:     Mouth: Mucous membranes are moist.     Pharynx: Oropharynx is clear.  Cardiovascular:     Rate and Rhythm: Normal rate and regular rhythm.     Heart sounds: Normal heart sounds.  Pulmonary:     Effort: Pulmonary effort is normal. No respiratory distress.     Breath sounds: Normal breath sounds.  Musculoskeletal:     Cervical back: Neck supple.  Skin:    General: Skin is warm and dry.  Neurological:     Mental Status: She is alert.  Psychiatric:        Mood and Affect: Mood normal.        Behavior: Behavior normal.      UC Treatments / Results  Labs (all labs ordered are listed, but only abnormal results are displayed) Labs Reviewed  POCT RAPID STREP A (OFFICE) - Normal    EKG   Radiology No results found.  Procedures Procedures (including critical care time)  Medications Ordered in UC Medications - No data to display  Initial  Impression / Assessment and Plan / UC Course  I have reviewed the triage vital signs and the nursing notes.  Pertinent labs & imaging results that were available during my care of the patient were reviewed by me and considered in my medical decision making (see chart for details).  Acute sinusitis, sore throat.  Rapid strep negative.  Patient has been symptomatic for more than 1 week.  She has been taking leftover amoxicillin and prednisone at subtherapeutic doses for the past 4 days.  She also has attempted various OTC treatments.  Treating today with doxycycline.  Instructed patient to stop the amoxicillin and prednisone and cautioned her not to take leftover medications.  Instructed her to take Tylenol, plain Mucinex, and use Flonase nasal spray.  Instructed patient to follow up with her PCP if her symptoms are not improving.  She agrees to plan of care.     Final Clinical Impressions(s) / UC Diagnoses   Final diagnoses:  Acute non-recurrent maxillary sinusitis  Sore throat     Discharge Instructions      Take the doxycycline as directed.  Follow up with your primary care provider if your symptoms are not improving.        ED Prescriptions     Medication Sig Dispense Auth. Provider   doxycycline (VIBRAMYCIN) 100 MG capsule Take 1 capsule (100 mg total) by mouth 2 (two) times daily for 7 days. 14 capsule Mickie Bail, NP  PDMP not reviewed this encounter.   Mickie Bail, NP 09/30/21 1330

## 2021-09-30 NOTE — Discharge Instructions (Addendum)
Take the doxycycline as directed.    Follow up with your primary care provider if your symptoms are not improving.    

## 2021-09-30 NOTE — ED Triage Notes (Signed)
Pt here with sore throat and nasal congestion x 1 week.

## 2021-10-03 ENCOUNTER — Other Ambulatory Visit (HOSPITAL_COMMUNITY): Payer: Self-pay | Admitting: Physician Assistant

## 2021-10-03 DIAGNOSIS — F39 Unspecified mood [affective] disorder: Secondary | ICD-10-CM

## 2021-10-03 DIAGNOSIS — F3181 Bipolar II disorder: Secondary | ICD-10-CM

## 2021-10-24 ENCOUNTER — Emergency Department (HOSPITAL_BASED_OUTPATIENT_CLINIC_OR_DEPARTMENT_OTHER): Payer: Medicaid Other | Admitting: Radiology

## 2021-10-24 ENCOUNTER — Other Ambulatory Visit (HOSPITAL_BASED_OUTPATIENT_CLINIC_OR_DEPARTMENT_OTHER): Payer: Self-pay

## 2021-10-24 ENCOUNTER — Encounter (HOSPITAL_BASED_OUTPATIENT_CLINIC_OR_DEPARTMENT_OTHER): Payer: Self-pay | Admitting: Obstetrics and Gynecology

## 2021-10-24 ENCOUNTER — Emergency Department (HOSPITAL_BASED_OUTPATIENT_CLINIC_OR_DEPARTMENT_OTHER)
Admission: EM | Admit: 2021-10-24 | Discharge: 2021-10-24 | Disposition: A | Discharge status: Home / Self Care | Payer: Medicaid Other | Attending: Emergency Medicine | Admitting: Emergency Medicine

## 2021-10-24 ENCOUNTER — Other Ambulatory Visit: Payer: Self-pay

## 2021-10-24 DIAGNOSIS — W228XXA Striking against or struck by other objects, initial encounter: Secondary | ICD-10-CM | POA: Insufficient documentation

## 2021-10-24 DIAGNOSIS — S62336A Displaced fracture of neck of fifth metacarpal bone, right hand, initial encounter for closed fracture: Secondary | ICD-10-CM | POA: Diagnosis not present

## 2021-10-24 DIAGNOSIS — M79644 Pain in right finger(s): Secondary | ICD-10-CM | POA: Diagnosis not present

## 2021-10-24 DIAGNOSIS — S6991XA Unspecified injury of right wrist, hand and finger(s), initial encounter: Secondary | ICD-10-CM | POA: Diagnosis present

## 2021-10-24 MED ORDER — OXYCODONE HCL 5 MG PO TABS
5.0000 mg | ORAL_TABLET | Freq: Once | ORAL | Status: AC
  Administered 2021-10-24: 5 mg via ORAL
  Filled 2021-10-24: qty 1

## 2021-10-24 MED ORDER — KETOROLAC TROMETHAMINE 60 MG/2ML IM SOLN
60.0000 mg | Freq: Once | INTRAMUSCULAR | Status: AC
  Administered 2021-10-24: 60 mg via INTRAMUSCULAR
  Filled 2021-10-24: qty 2

## 2021-10-24 MED ORDER — ACETAMINOPHEN 500 MG PO TABS
1000.0000 mg | ORAL_TABLET | Freq: Once | ORAL | Status: AC
  Administered 2021-10-24: 1000 mg via ORAL
  Filled 2021-10-24: qty 2

## 2021-10-24 MED ORDER — OXYCODONE HCL 5 MG PO TABS
5.0000 mg | ORAL_TABLET | ORAL | 0 refills | Status: DC | PRN
  Filled 2021-10-24: qty 25, 5d supply, fill #0

## 2021-10-24 NOTE — Discharge Instructions (Signed)
For your pain, you may take up to 1000mg of acetaminophen (tylenol) 4 times daily for up to a week. This is the maximum dose of acetminophen (tylenol) you can take from all sources. Please check other over-the-counter medications and prescriptions to ensure you are not taking other medications that contain acetaminophen.  You may also take ibuprofen 400 mg 6 times a day OR 600mg 4 times a day alternating with or at the same time as tylenol.  Take oxycodone as needed for breakthrough pain.  This medication can be addicting, sedating and cause constipation.    

## 2021-10-24 NOTE — ED Triage Notes (Signed)
Patient reports she hit a wooden door frame in anger. Right hand bruising and swelling.

## 2021-10-24 NOTE — ED Notes (Signed)
Patient Alert and oriented to baseline. Stable and ambulatory to baseline. Patient verbalized understanding of the discharge instructions.  Patient belongings were taken by the patient.   

## 2021-10-24 NOTE — ED Provider Notes (Signed)
[facility] EMERGENCY DEPT Provider Note   CSN: [id number] Arrival date & time: 10/24/21  2694     History  Chief Complaint  Patient presents with   Hand Injury    Amber Nielsen is a 36 y.o. female who presents to the emergency department for a hand injury after punching a door frame.  She struck the door frame in anger.  She tried to pull back the last second, and this caused her to catch her hand on the edge of the door frame near the fifth MCP joint.  It began to swell and bruise very rapidly.  This happened around 11 PM last night.  She was unable to get very much sleep because of pain.  She has taken about 6 Advil.  She is concerned that she broke her hand.  No known drug allergies  Medical history: Gastric bypass surgery Lamictal Wellbutrin Celexa  Hand Injury      Home Medications Prior to Admission medications   Medication Sig Start Date End Date Taking? Authorizing Provider  oxyCODONE (ROXICODONE) 5 MG immediate release tablet Take 1 tablet (5 mg total) by mouth every 4 (four) hours as needed for severe pain. 10/24/21  Yes Alvira Monday, MD  albuterol (PROVENTIL HFA;VENTOLIN HFA) 108 (90 Base) MCG/ACT inhaler Inhale 2 puffs into the lungs every 6 (six) hours as needed for wheezing or shortness of breath.    [provider]  atomoxetine (STRATTERA) 40 MG capsule Take 1 capsule (40 mg total) by mouth daily. 06/18/21 06/18/22  Nwoko, Tommas Olp, PA  buPROPion (WELLBUTRIN XL) 300 MG 24 hr tablet TAKE 1 TABLET(300 MG) BY MOUTH DAILY 08/06/21   Nwoko, Tommas Olp, PA  Calcium-Vitamin D-Vitamin K (CALCIUM + D) [phone] MG-UNT-MCG CHEW See admin instructions.    [provider]  cetirizine (ZYRTEC ALLERGY) 10 MG tablet Take 1 tablet (10 mg total) by mouth daily. 08/13/20   Wallis Bamberg, PA-C  citalopram (CELEXA) 20 MG tablet Take 1 tablet (20 mg total) by mouth daily. 06/18/21   Nwoko, Tommas Olp, PA  clindamycin (CLEOCIN) 300 MG capsule Take 1  capsule (300 mg total) by mouth 3 (three) times daily. 08/13/20   Wallis Bamberg, PA-C  lamoTRIgine (LAMICTAL) 150 MG tablet TAKE 1 TABLET(150 MG) BY MOUTH DAILY 10/03/21   Bobbye Morton, MD  levonorgestrel (MIRENA, 52 MG,) 20 MCG/24HR IUD 1 each by Intrauterine route continuous.    [provider]  Multiple Vitamin (MULTIVITAMIN) tablet Take 1 tablet by mouth daily.    [provider]  omeprazole (PRILOSEC) 40 MG capsule Take 40 mg by mouth 2 (two) times daily. 07/20/19   [provider]  pseudoephedrine (SUDAFED) 60 MG tablet Take 1 tablet (60 mg total) by mouth every 8 (eight) hours as needed for congestion. 08/13/20   Wallis Bamberg, PA-C  Vitamin D, Ergocalciferol, (DRISDOL) 1.25 MG (50000 UNIT) CAPS capsule Take 50,000 Units by mouth every 7 (seven) days.    [provider]      Allergies    Metformin and related, Celery oil, Cymbalta [duloxetine hcl], Lamictal [lamotrigine], and Prozac [fluoxetine hcl]    Review of Systems   Review of Systems  All other systems reviewed and are negative.   Physical Exam Updated Vital Signs BP (!) 120/91 (BP Location: Right Arm)   Pulse 73   Temp 98.7 F (37.1 C)   Resp 18   Ht 5\' 10"  (1.778 m)   Wt 65.4 kg   SpO2 99%   BMI 20.69  kg/m  Physical Exam Vitals and nursing note reviewed.  Constitutional:      General: She is not in acute distress.    Appearance: She is well-developed.  HENT:     Head: Normocephalic and atraumatic.  Eyes:     Conjunctiva/sclera: Conjunctivae normal.  Cardiovascular:     Rate and Rhythm: Normal rate and regular rhythm.     Heart sounds: No murmur heard. Pulmonary:     Effort: Pulmonary effort is normal. No respiratory distress.     Breath sounds: Normal breath sounds.  Abdominal:     Palpations: Abdomen is soft.     Tenderness: There is no abdominal tenderness.  Musculoskeletal:        General: Swelling, tenderness and signs of injury present.     Cervical back: Neck supple.      Comments: Swelling and ecchymosis around fifth MCP joint of right hand.  Right fifth phalanx held in slight flexion.  Skin:    General: Skin is warm and dry.     Capillary Refill: Capillary refill takes less than 2 seconds.  Neurological:     Mental Status: She is alert.  Psychiatric:        Mood and Affect: Mood normal.     ED Results / Procedures / Treatments   Labs (all labs ordered are listed, but only abnormal results are displayed) Labs Reviewed - No data to display  EKG None  Radiology DG Hand Complete Right  Result Date: 10/24/2021 CLINICAL DATA:  Hand injury, punched a door frame, fifth metacarpal pain EXAM: RIGHT HAND - COMPLETE 3+ VIEW COMPARISON:  None FINDINGS: Osseous mineralization normal. Joint spaces preserved. Congenital lunatotriquetral fusion noted, developmental variant. Displaced and mildly angulated distal RIGHT fifth metacarpal fracture. No additional fracture, dislocation, or bone destruction. IMPRESSION: Mildly displaced and angulated distal RIGHT fifth metacarpal fracture. Electronically Signed   By: Ulyses Southward M.D.   On: 10/24/2021 08:53    Procedures Procedures    Medications Ordered in ED Medications  oxyCODONE (Oxy IR/ROXICODONE) immediate release tablet 5 mg (5 mg Oral Given 10/24/21 0929)  acetaminophen (TYLENOL) tablet 1,000 mg (1,000 mg Oral Given 10/24/21 0929)  ketorolac (TORADOL) injection 60 mg (60 mg Intramuscular Given 10/24/21 0300)    ED Course/ Medical Decision Making/ A&P                           Medical Decision Making Amount and/or Complexity of Data Reviewed Radiology: ordered.   The patient is a 36 year old female who presents for injury to right hand after punching a door frame, striking it with her right fifth MCP joint.  Area is swollen bruised and tender on exam.  Neurovascularly intact.  Skin is intact.  X-ray reveals fracture of fifth metacarpal head.  Will treat pain, place splint, and refer to orthopedics as an  outpatient.  Imaging Studies ordered:  I ordered (or co-signed) imaging studies including hand x-ray  I independently visualized and interpreted imaging which showed fracture of fifth metacarpal head of right hand. I agree with the radiologist interpretation  Medicines ordered and prescription drug management:  I ordered medication including oxycodone 5 mg p.o., acetaminophen 1000 mg p.o., Toradol 60 mg p.o. Reevaluation of the patient after these medicines showed that the patient improved   Consultations Obtained:  I requested consultation with orthopedic surgery,  and discussed lab and imaging findings as well as pertinent plan - they recommend: Splint and follow-up outpatient.  Reevaluation:  After the interventions noted above, I reevaluated the patient and found that they have :improved.    Dispostion:  After consideration of the diagnostic results and the patients response to treatment, I feel that the patent would benefit from discharge home with splint and outpatient follow-up with orthopedic surgery..          Final Clinical Impression(s) / ED Diagnoses Final diagnoses:  Displaced fracture of neck of fifth metacarpal bone, right hand, initial encounter for closed fracture    Rx / DC Orders ED Discharge Orders          Ordered    oxyCODONE (ROXICODONE) 5 MG immediate release tablet  Every 4 hours PRN        10/24/21 0915              Marrianne Mood, MD 10/24/21 1610    Alvira Monday, MD 10/24/21 2300

## 2021-10-24 NOTE — ED Notes (Signed)
Ice pack applied to R hand.

## 2021-10-27 ENCOUNTER — Ambulatory Visit: Payer: Medicaid Other | Admitting: Orthopedic Surgery

## 2021-10-28 ENCOUNTER — Ambulatory Visit (INDEPENDENT_AMBULATORY_CARE_PROVIDER_SITE_OTHER): Payer: Medicaid Other | Admitting: Orthopedic Surgery

## 2021-10-28 DIAGNOSIS — S62336A Displaced fracture of neck of fifth metacarpal bone, right hand, initial encounter for closed fracture: Secondary | ICD-10-CM

## 2021-10-28 DIAGNOSIS — S62339A Displaced fracture of neck of unspecified metacarpal bone, initial encounter for closed fracture: Secondary | ICD-10-CM

## 2021-10-28 NOTE — Progress Notes (Signed)
Office Visit Note   Patient: Amber Nielsen           Date of Birth: [birth year]           MRN: [id number] Visit Date: 10/28/2021              Requested by: [facility] [address] [place],  [place] [address] PCP: [facility]   Assessment & Plan: Visit Diagnoses:  1. Closed boxer's fracture, initial encounter     Plan: We reviewed patient's x-rays from the ER which demonstrate a right fifth metacarpal neck fracture.  She has no evidence of clinical malrotation or pseudo clawing on exam.  We discussed the nature of this injury as well as both surgical and nonsurgical treatment options.  We will treat this nonsurgically with hand therapy and early range of motion.  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  I can see her back again as needed.  Follow-Up Instructions: No follow-ups on file.   Orders:  No orders of the defined types were placed in this encounter.  No orders of the defined types were placed in this encounter.     Procedures: No procedures performed   Clinical Data: No additional findings.   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  This occurred on Thursday when she fell and hit a wooden door frame with her right hand.  She was seen in the ER where she was found to have a right fifth metacarpal neck fracture.  Her pain somewhat better today but still 8-9/10 with certain activities.  Her pain improves with rest.  She denies pain elsewhere in the hand.    Review of Systems   Objective: Vital Signs: There were no vitals taken for this visit.  Physical Exam Constitutional:      Appearance: Normal  appearance.  Cardiovascular:     Rate and Rhythm: Normal rate.     Pulses: Normal pulses.  Pulmonary:     Effort: Pulmonary effort is normal.  Skin:    General: Skin is warm and dry.     Capillary Refill: Capillary refill takes less than 2 seconds.  Neurological:     Mental Status: She is alert.     Right Hand Exam   Tenderness  Right hand tenderness location: TTP at fifth metacarpal neck w/ mild swelling.  Other  Erythema: absent Sensation: normal Pulse: present  Comments:  No malrotation or pseudoclawing with near full ROM of small finger.  No TTP in remainder of hand.       Specialty Comments:  No specialty comments available.  Imaging: No results found.   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 rodent animals   Bipolar 1 disorder (HCC)    Depression    H/O varicella    Headache(784.0)    History of gestational diabetes mellitus (GDM) 01/12/2017   History of pre-eclampsia 01/12/2017   HTN (hypertension)    IBS (irritable bowel syndrome)    Obesity    Pregnancy induced hypertension    Sepsis secondary to UTI (HCC) 08/26/2016   Ureteral stone with hydronephrosis 08/27/2016    Family History  Problem Relation Age of Onset   Heart disease Father    Heart attack Father    Nephrolithiasis Father    Colon polyps Father    Healthy Mother    Mental retardation Maternal Aunt    Diabetes Maternal Grandmother    Cancer Maternal Grandmother        ovarian and breast   Diabetes  Maternal Grandfather    Diabetes Paternal Grandmother    Hypertension Paternal Grandmother    Heart disease Paternal Grandmother    Colon polyps Paternal Grandmother    Diabetes Paternal Grandfather    Colon polyps Paternal Uncle     Past Surgical History:  Procedure Laterality Date   COLONOSCOPY     CYSTOSCOPY W/ URETERAL STENT PLACEMENT Left 08/26/2016   Procedure: CYSTOSCOPY WITH RETROGRADE PYELOGRAM/URETERAL STENT PLACEMENT;  Surgeon: Marcine Matar, MD;  Location: [facility] OR;  Service: Urology;  Laterality: Left;   CYSTOSCOPY WITH RETROGRADE PYELOGRAM, URETEROSCOPY AND STENT PLACEMENT Left 09/14/2016   Procedure: CYSTOSCOPY WITH URETEROSCOPY , STONE EXTRACTION AND STENT REMOVAL;  Surgeon: Marcine Matar, MD;  Location: [facility]S;  Service: Urology;  Laterality: Left;   GASTRIC BYPASS     TOOTH EXTRACTION     Social History   Occupational History   Occupation: hair stylist  Tobacco Use   Smoking status: Every Day    Packs/day: 0.50    Types: Cigarettes   Smokeless tobacco: Never  Vaping Use   Vaping Use: Never used  Substance and Sexual Activity   Alcohol use: Yes    Comment: socially   Drug use: Yes    Types: Marijuana    Comment: daily use or  smoke 1/8 weekly   Sexual activity: Yes    Partners: Male    Birth control/protection: None

## 2021-10-28 NOTE — Addendum Note (Signed)
Addended by: Marlyne Beards on: 10/28/2021 09:37 AM   Modules accepted: Orders

## 2021-10-30 ENCOUNTER — Telehealth (HOSPITAL_COMMUNITY): Payer: Self-pay | Admitting: Licensed Clinical Social Worker

## 2021-10-30 ENCOUNTER — Encounter (HOSPITAL_COMMUNITY): Payer: Self-pay

## 2021-10-30 ENCOUNTER — Ambulatory Visit (HOSPITAL_COMMUNITY): Payer: Medicaid Other | Admitting: Licensed Clinical Social Worker

## 2021-10-30 NOTE — Telephone Encounter (Signed)
LCSW sent two links to pt phone with no response at 1301 and 1305. LCSW f/u with PC at 1308 phone rang several times and disconnected. LCSW sent another link at 1310 and waited until 1315 before disconnecting. This is pt 2nd no show for therapy in a row. LCSW also notes no show for medication provider on 08/20/21. That will make 3 no shows in last 10 weeks for [facility]. LCSW will advise that pt come in for walk in appointment moving forward to administrative staff.

## 2021-12-05 ENCOUNTER — Telehealth (HOSPITAL_COMMUNITY): Payer: Self-pay | Admitting: *Deleted

## 2021-12-05 NOTE — Telephone Encounter (Signed)
Denied, patient has not been seen since 06/2021 and has also no showed 2x for therapy appts since then. She will need to be seen again by a provider before restarting lamictal at a low dose.

## 2021-12-05 NOTE — Telephone Encounter (Signed)
Rx Refill Request --  lamoTRIgine (LAMICTAL) 150 MG tablet TAKE 1 TABLET(150 MG) BY MOUTH DAILY

## 2021-12-16 ENCOUNTER — Emergency Department (HOSPITAL_COMMUNITY)
Admission: EM | Admit: 2021-12-16 | Discharge: 2021-12-16 | Discharge status: Against Medical Advice | Payer: Medicaid Other

## 2021-12-16 ENCOUNTER — Encounter (HOSPITAL_BASED_OUTPATIENT_CLINIC_OR_DEPARTMENT_OTHER): Payer: Self-pay | Admitting: *Deleted

## 2021-12-16 ENCOUNTER — Emergency Department (EMERGENCY_DEPARTMENT_HOSPITAL)
Admission: EM | Admit: 2021-12-16 | Discharge: 2021-12-17 | Disposition: A | Discharge status: Other Acute Inpatient Hospital | Payer: Medicaid Other | Source: Home / Self Care | Attending: Emergency Medicine | Admitting: Emergency Medicine

## 2021-12-16 ENCOUNTER — Other Ambulatory Visit: Payer: Self-pay

## 2021-12-16 DIAGNOSIS — I10 Essential (primary) hypertension: Secondary | ICD-10-CM | POA: Insufficient documentation

## 2021-12-16 DIAGNOSIS — F332 Major depressive disorder, recurrent severe without psychotic features: Secondary | ICD-10-CM | POA: Insufficient documentation

## 2021-12-16 DIAGNOSIS — J45909 Unspecified asthma, uncomplicated: Secondary | ICD-10-CM | POA: Insufficient documentation

## 2021-12-16 DIAGNOSIS — X838XXA Intentional self-harm by other specified means, initial encounter: Secondary | ICD-10-CM | POA: Insufficient documentation

## 2021-12-16 DIAGNOSIS — Z7951 Long term (current) use of inhaled steroids: Secondary | ICD-10-CM | POA: Insufficient documentation

## 2021-12-16 DIAGNOSIS — Z79899 Other long term (current) drug therapy: Secondary | ICD-10-CM | POA: Insufficient documentation

## 2021-12-16 DIAGNOSIS — T1491XA Suicide attempt, initial encounter: Secondary | ICD-10-CM | POA: Insufficient documentation

## 2021-12-16 DIAGNOSIS — Z1152 Encounter for screening for COVID-19: Secondary | ICD-10-CM | POA: Insufficient documentation

## 2021-12-16 DIAGNOSIS — T39391A Poisoning by other nonsteroidal anti-inflammatory drugs [NSAID], accidental (unintentional), initial encounter: Secondary | ICD-10-CM | POA: Insufficient documentation

## 2021-12-16 LAB — CBC
HCT: 42.9 % (ref 36.0–46.0)
Hemoglobin: 14.5 g/dL (ref 12.0–15.0)
MCH: 31.5 pg (ref 26.0–34.0)
MCHC: 33.8 g/dL (ref 30.0–36.0)
MCV: 93.1 fL (ref 80.0–100.0)
Platelets: 397 10*3/uL (ref 150–400)
RBC: 4.61 MIL/uL (ref 3.87–5.11)
RDW: 12.4 % (ref 11.5–15.5)
WBC: 10.4 10*3/uL (ref 4.0–10.5)
nRBC: 0 % (ref 0.0–0.2)

## 2021-12-16 LAB — COMPREHENSIVE METABOLIC PANEL
ALT: 23 U/L (ref 0–44)
AST: 26 U/L (ref 15–41)
Albumin: 4.8 g/dL (ref 3.5–5.0)
Alkaline Phosphatase: 69 U/L (ref 38–126)
Anion gap: 13 (ref 5–15)
BUN: 8 mg/dL (ref 6–20)
CO2: 27 mmol/L (ref 22–32)
Calcium: 9.6 mg/dL (ref 8.9–10.3)
Chloride: 100 mmol/L (ref 98–111)
Creatinine, Ser: 0.78 mg/dL (ref 0.44–1.00)
GFR, Estimated: >60 mL/min (ref >60)
Glucose, Bld: 92 mg/dL (ref 70–99)
Potassium: 3.4 mmol/L — ABNORMAL LOW (ref 3.5–5.1)
Sodium: 140 mmol/L (ref 135–145)
Total Bilirubin: 0.2 mg/dL — ABNORMAL LOW (ref 0.3–1.2)
Total Protein: 8.6 g/dL — ABNORMAL HIGH (ref 6.5–8.1)

## 2021-12-16 LAB — RAPID URINE DRUG SCREEN, HOSP PERFORMED
Amphetamines: NOT DETECTED
Barbiturates: NOT DETECTED
Benzodiazepines: NOT DETECTED
Cocaine: POSITIVE — AB
Opiates: NOT DETECTED
Tetrahydrocannabinol: POSITIVE — AB

## 2021-12-16 LAB — PREGNANCY, URINE: Preg Test, Ur: NEGATIVE

## 2021-12-16 MED ORDER — PANTOPRAZOLE SODIUM 40 MG IV SOLR
40.0000 mg | Freq: Once | INTRAVENOUS | Status: AC
  Administered 2021-12-17: 40 mg via INTRAVENOUS
  Filled 2021-12-16: qty 10

## 2021-12-16 MED ORDER — SODIUM CHLORIDE 0.9 % IV BOLUS
1000.0000 mL | Freq: Once | INTRAVENOUS | Status: AC
  Administered 2021-12-17: 1000 mL via INTRAVENOUS

## 2021-12-16 NOTE — ED Notes (Signed)
Pt belongings have been removed and placed behind nurses station. Pt's items include a pair of shoes, pair socks, shirt, bra, and jeans with a belt. Cell phone removed from pt and was given to her husband waiting in the lobby.

## 2021-12-16 NOTE — ED Notes (Signed)
Pt changed out of her clothing and into burgundy scrubs.  Pt belongings (clothes, phone, charger, watch and ring with black stone) given to pt spouse per pt request.  Pt wanded by security.

## 2021-12-16 NOTE — ED Notes (Addendum)
Call to poision control: [phone]   Poison control advises that pt is to be kept on cardiac monitor tachycardia and seizure can be a concern with large doses but would not be a concern with 10 tablets)  Pt should be watched for 6 hours and a tylenol level should be rechecked 4 hours post injection.  At this time CMP and Salicylate and Acetaminophen level should be obtained.    They recommend IV fluid for rehydration if pt is tachycardic and benzos in case of seizures.

## 2021-12-16 NOTE — ED Provider Notes (Signed)
[facility] EMERGENCY DEPT Provider Note  CSN: [id number] Arrival date & time: 12/16/21 2254  Chief Complaint(s) Drug Overdose  ED Triage Notes Flueckiger, Armen Pickup, RN (Registered Nurse)   Date of Service: 12/16/2021 10:57 PM   Signed   Pt reports that she took a bottle of aleve.  Pt states that she has had a very hard time for the past few weeks.  Pt states that she "hated everything and hated myself and wanted it to stop and it was a stupid impulse decision".  Pt estimates that there were 10 aleve in the bottle.  Pt has hx of gastric bypass, this was in 2020.  Pt consumed a whiteclaw and a "bootlegger" tonight.  Pt is alert and oriented and cooperative and calm in triage.  Pt was brought in by her husband.  Pt states that she is safe at home.     HPI Amber Nielsen is a 36 y.o. female here after suicide attempt by overdosing on naproxen. She is unsure of how many pills were in the bottle, but reports that there were 10 left after she ingested the others. She did this approx 9:30 - 10p. She also drank alcohol tonight. She reports that  over the last 2-3 weeks she has felt "down" and "depressed." She has not missed any doses of her medication.   The history is provided by the patient.    Past Medical History Past Medical History:  Diagnosis Date   Acute pyelonephritis    Anxiety    Asthma    allergy related. rodent animals   Bipolar 1 disorder (HCC)    Depression    H/O varicella    Headache(784.0)    History of gestational diabetes mellitus (GDM) 01/12/2017   History of pre-eclampsia 01/12/2017   HTN (hypertension)    IBS (irritable bowel syndrome)    Obesity    Pregnancy induced hypertension    Sepsis secondary to UTI (HCC) 08/26/2016   Ureteral stone with hydronephrosis 08/27/2016   Patient Active Problem List   Diagnosis Date Noted   Boxer's fracture 10/28/2021   Attention and concentration deficit 06/18/2021   Unspecified mood (affective) disorder (HCC)  10/25/2020   Bipolar 2 disorder (HCC) 09/30/2020   Tobacco abuse 02/17/2017   Major depressive disorder, 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 Authorizing Provider  albuterol (PROVENTIL HFA;VENTOLIN HFA) 108 (90 Base) MCG/ACT inhaler Inhale 2 puffs into the lungs every 6 (six) hours as needed for wheezing or shortness of breath.    [provider]  atomoxetine (STRATTERA) 40 MG capsule Take 1 capsule (40 mg total) by mouth daily. 06/18/21 06/18/22  Nwoko, Tommas Olp, PA  buPROPion (WELLBUTRIN XL) 300 MG 24 hr tablet TAKE 1 TABLET(300 MG) BY MOUTH DAILY 08/06/21   Nwoko, Tommas Olp, PA  Calcium-Vitamin D-Vitamin K (CALCIUM + D) [phone] MG-UNT-MCG CHEW See admin instructions.    [provider]  cetirizine (ZYRTEC ALLERGY) 10 MG tablet Take 1 tablet (10 mg total) by mouth daily. 08/13/20   Wallis Bamberg, PA-C  citalopram (CELEXA) 20 MG tablet Take 1 tablet (20 mg total) by mouth daily. 06/18/21   Nwoko, Tommas Olp, PA  clindamycin (CLEOCIN) 300 MG capsule Take 1 capsule (300 mg total) by mouth 3 (three) times daily. 08/13/20   Wallis Bamberg, PA-C  lamoTRIgine (LAMICTAL) 150 MG tablet TAKE  1 TABLET(150 MG) BY MOUTH DAILY 10/03/21   Freida Busman, MD  levonorgestrel (MIRENA, 52 MG,) 20 MCG/24HR IUD 1 each by Intrauterine route continuous.    [provider]  Multiple Vitamin (MULTIVITAMIN) tablet Take 1 tablet by mouth daily.    [provider]  omeprazole (PRILOSEC) 40 MG capsule Take 40 mg by mouth 2 (two) times daily. 07/20/19   [provider]  oxyCODONE (ROXICODONE) 5 MG immediate release tablet Take 1 tablet (5 mg total) by mouth every 4 (four) hours as needed for severe pain. 10/24/21   Gareth Morgan, MD  pseudoephedrine (SUDAFED) 60 MG tablet Take 1 tablet (60 mg total) by  mouth every 8 (eight) hours as needed for congestion. 08/13/20   Jaynee Eagles, PA-C  Vitamin D, Ergocalciferol, (DRISDOL) 1.25 MG (50000 UNIT) CAPS capsule Take 50,000 Units by mouth every 7 (seven) days.    [provider]                                                                                                                                    Allergies Metformin and related, Celery oil, Cymbalta [duloxetine hcl], Lamictal [lamotrigine], and Prozac [fluoxetine hcl]  Review of Systems Review of Systems As noted in HPI  Physical Exam Vital Signs  I have reviewed the triage vital signs BP 105/72 (BP Location: Right Arm)   Pulse 83   Temp 98 F (36.7 C) (Oral)   Resp 20   Wt 65.8 kg   SpO2 97%   BMI 20.81 kg/m   Physical Exam Vitals reviewed.  Constitutional:      General: She is not in acute distress.    Appearance: She is well-developed. She is not diaphoretic.  HENT:     Head: Normocephalic and atraumatic.     Nose: Nose normal.  Eyes:     General: No scleral icterus.       Right eye: No discharge.        Left eye: No discharge.     Conjunctiva/sclera: Conjunctivae normal.     Pupils: Pupils are equal, round, and reactive to light.  Cardiovascular:     Rate and Rhythm: Normal rate and regular rhythm.     Heart sounds: No murmur heard.    No friction rub. No gallop.  Pulmonary:     Effort: Pulmonary effort is normal. No respiratory distress.     Breath sounds: Normal breath sounds. No stridor. No rales.  Abdominal:     General: There is no distension.     Palpations: Abdomen is soft.     Tenderness: There is no abdominal tenderness.  Musculoskeletal:        General: No tenderness.       Arms:     Cervical back: Normal range of motion and neck supple.  Skin:    General: Skin is warm and dry.     Findings: No erythema or  rash.  Neurological:     Mental Status: She is alert and oriented to person, place, and time.  Psychiatric:        Attention and  Perception: Attention normal.        Mood and Affect: Affect is labile.        Speech: Speech normal.        Behavior: Behavior normal.     ED Results and Treatments Labs (all labs ordered are listed, but only abnormal results are displayed) Labs Reviewed  COMPREHENSIVE METABOLIC PANEL - Abnormal; Notable for the following components:      Result Value   Potassium 3.4 (*)    Total Protein 8.6 (*)    Total Bilirubin 0.2 (*)    All other components within normal limits  ETHANOL - Abnormal; Notable for the following components:   Alcohol, Ethyl (B) 33 (*)    All other components within normal limits  SALICYLATE LEVEL - Abnormal; Notable for the following components:   Salicylate Lvl <7.0 (*)    All other components within normal limits  ACETAMINOPHEN LEVEL - Abnormal; Notable for the following components:   Acetaminophen (Tylenol), Serum <10 (*)    All other components within normal limits  RAPID URINE DRUG SCREEN, HOSP PERFORMED - Abnormal; Notable for the following components:   Cocaine POSITIVE (*)    Tetrahydrocannabinol POSITIVE (*)    All other components within normal limits  ACETAMINOPHEN LEVEL - Abnormal; Notable for the following components:   Acetaminophen (Tylenol), Serum <10 (*)    All other components within normal limits  RESP PANEL BY RT-PCR (FLU A&B, COVID) ARPGX2  CBC  PREGNANCY, URINE                                                                                                                         EKG  EKG Interpretation  Date/Time:  Tuesday December 16 2021 23:02:22 EDT Ventricular Rate:  96 PR Interval:  146 QRS Duration: 98 QT Interval:  360 QTC Calculation: 454 R Axis:   70 Text Interpretation: Normal sinus rhythm Incomplete right bundle branch block Borderline ECG Otherwise no significant change When compared with ECG of 17-Jan-2018 12:14, PREVIOUS ECG IS PRESENT Confirmed by Drema Pry [phone]) on 12/16/2021 11:46:32 PM        Radiology No results found.  Medications Ordered in ED Medications  metoCLOPramide (REGLAN) 5 MG/ML injection (  Not Given 12/17/21 0556)  buPROPion (WELLBUTRIN XL) 24 hr tablet 300 mg (has no administration in time range)  citalopram (CELEXA) tablet 20 mg (has no administration in time range)  lamoTRIgine (LAMICTAL) tablet 150 mg (has no administration in time range)  pantoprazole (PROTONIX) injection 40 mg (40 mg Intravenous Given 12/17/21 0008)  sodium chloride 0.9 % bolus 1,000 mL (0 mLs Intravenous Stopped 12/17/21 0433)  Procedures .1-3 Lead EKG Interpretation  Performed by: Nira Conn, MD Authorized by: Nira Conn, MD     Interpretation: normal     ECG rate:  102   ECG rate assessment: tachycardic     Rhythm: sinus tachycardia     Ectopy: none     Conduction: normal     (including critical care time)  Medical Decision Making / ED Course   Medical Decision Making Amount and/or Complexity of Data Reviewed Labs: ordered. Decision-making details documented in ED Course. ECG/medicine tests: ordered and independent interpretation performed. Decision-making details documented in ED Course.  Risk Prescription drug management. Decision regarding hospitalization.    Patient presents after suicide attempt by trying to overdose on a bottle of Aleve.  Patient endorsed depression leading to the attempt. She has no physical complaints We will need medical clearance and monitoring.  EKG without dysrhythmias, interval changes. CBC without leukocytosis or anemia. CMP with mild hypokalemia, no other significant electrolyte derangements or renal sufficiency.  Salicylate and acetaminophen levels negative.  4-hour acetaminophen negative. UDS positive for cocaine and THC. Urine pregnancy negative. Patient was provided with IV  fluids and Protonix. Repeat EKG at 6 hours without any acute changes.  Patient is medically cleared for behavioral health evaluation and disposition. Patient is currently voluntary.  IVC paperwork was filled out in case patient changes her mind.       Final Clinical Impression(s) / ED Diagnoses Final diagnoses:  Suicide attempt [facility])           This chart was dictated using voice recognition software.  Despite best efforts to proofread,  errors can occur which can change the documentation meaning.    Nira Conn, MD 12/17/21 [phone]

## 2021-12-16 NOTE — ED Triage Notes (Signed)
Pt reports that she took a bottle of aleve.  Pt states that she has had a very hard time for the past few weeks.  Pt states that she "hated everything and hated myself and wanted it to stop and it was a stupid impulse decision".  Pt estimates that there were 10 aleve in the bottle.  Pt has hx of gastric bypass, this was in 2020.  Pt consumed a whiteclaw and a "bootlegger" tonight.  Pt is alert and oriented and cooperative and calm in triage.  Pt was brought in by her husband.  Pt states that she is safe at home.

## 2021-12-16 NOTE — ED Notes (Signed)
Pt asked staff how long it would be until she goes back because she has over dosed on Aleve, staff told the pt that she would be pulled back to triage shortly and would get their vitals. Pt left immediately after getting her vitals taken

## 2021-12-16 NOTE — ED Provider Notes (Incomplete)
[facility] EMERGENCY DEPT Provider Note  CSN: [id number] Arrival date & time: 12/16/21 2254  Chief Complaint(s) Drug Overdose  ED Triage Notes Flueckiger, Armen Pickup, RN (Registered Nurse) . Marland Kitchen Date of Service: 12/16/2021 10:57 PM . . Signed   Pt reports that she took a bottle of aleve.  Pt states that she has had a very hard time for the past few weeks.  Pt states that she "hated everything and hated myself and wanted it to stop and it was a stupid impulse decision".  Pt estimates that there were 10 aleve in the bottle.  Pt has hx of gastric bypass, this was in 2020.  Pt consumed a whiteclaw and a "bootlegger" tonight.  Pt is alert and oriented and cooperative and calm in triage.  Pt was brought in by her husband.  Pt states that she is safe at home.     HPI Amber Nielsen is a 36 y.o. female here after suicide attempt by overdosing on naproxen. She is unsure of how many pills were in the bottle, but reports that there were 10 left after she ingested the others. She did this approx 9:30 - 10p. She also drank alcohol tonight. She reports that  over the last 2-3 weeks she has felt "down" and "depressed." She has not missed any doses of her medication.    Drug Overdose    Past Medical History Past Medical History:  Diagnosis Date  . Acute pyelonephritis   . Anxiety   . Asthma    allergy related. rodent animals  . Bipolar 1 disorder (HCC)   . Depression   . H/O varicella   . Headache(784.0)   . History of gestational diabetes mellitus (GDM) 01/12/2017  . History of pre-eclampsia 01/12/2017  . HTN (hypertension)   . IBS (irritable bowel syndrome)   . Obesity   . Pregnancy induced hypertension   . Sepsis secondary to UTI (HCC) 08/26/2016  . Ureteral stone with hydronephrosis 08/27/2016   Patient Active Problem List   Diagnosis Date Noted  . Boxer's fracture 10/28/2021  . Attention and concentration deficit 06/18/2021  . Unspecified mood (affective) disorder (HCC)  10/25/2020  . Bipolar 2 disorder (HCC) 09/30/2020  . Tobacco abuse 02/17/2017  . 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 Cannabis abuse 02/02/2016  . Morbid obesity (HCC) 12/15/2012   Home Medication(s) Prior to Admission medications   Medication Sig Start Date End Date Taking? Authorizing Provider  albuterol (PROVENTIL HFA;VENTOLIN HFA) 108 (90 Base) MCG/ACT inhaler Inhale 2 puffs into the lungs every 6 (six) hours as needed for wheezing or shortness of breath.    [provider]  atomoxetine (STRATTERA) 40 MG capsule Take 1 capsule (40 mg total) by mouth daily. 06/18/21 06/18/22  Nwoko, Tommas Olp, PA  buPROPion (WELLBUTRIN XL) 300 MG 24 hr tablet TAKE 1 TABLET(300 MG) BY MOUTH DAILY 08/06/21   Nwoko, Tommas Olp, PA  Calcium-Vitamin D-Vitamin K (CALCIUM + D) [phone] MG-UNT-MCG CHEW See admin instructions.    [provider]  cetirizine (ZYRTEC ALLERGY) 10 MG tablet Take 1 tablet (10 mg total) by mouth daily. 08/13/20   Wallis Bamberg, PA-C  citalopram (CELEXA) 20 MG tablet Take 1 tablet (20 mg total) by mouth daily. 06/18/21   Nwoko, Tommas Olp, PA  clindamycin (CLEOCIN) 300 MG capsule Take 1 capsule (300 mg total) by mouth 3 (three) times daily. 08/13/20   Wallis Bamberg, PA-C  lamoTRIgine (LAMICTAL) 150 MG tablet TAKE 1 TABLET(150 MG) BY  MOUTH DAILY 10/03/21   Freida Busman, MD  levonorgestrel (MIRENA, 52 MG,) 20 MCG/24HR IUD 1 each by Intrauterine route continuous.    [provider]  Multiple Vitamin (MULTIVITAMIN) tablet Take 1 tablet by mouth daily.    [provider]  omeprazole (PRILOSEC) 40 MG capsule Take 40 mg by mouth 2 (two) times daily. 07/20/19   [provider]  oxyCODONE (ROXICODONE) 5 MG immediate release tablet Take 1 tablet (5 mg total) by mouth every 4 (four) hours as needed for severe pain. 10/24/21   Gareth Morgan, MD  pseudoephedrine (SUDAFED) 60 MG tablet Take 1 tablet (60 mg total)  by mouth every 8 (eight) hours as needed for congestion. 08/13/20   Jaynee Eagles, PA-C  Vitamin D, Ergocalciferol, (DRISDOL) 1.25 MG (50000 UNIT) CAPS capsule Take 50,000 Units by mouth every 7 (seven) days.    [provider]                                                                                                                                    Allergies Metformin and related, Celery oil, Cymbalta [duloxetine hcl], Lamictal [lamotrigine], and Prozac [fluoxetine hcl]  Review of Systems Review of Systems As noted in HPI  Physical Exam Vital Signs  I have reviewed the triage vital signs BP 113/72 (BP Location: Right Arm)   Pulse 99   Temp 97.7 F (36.5 C) (Oral)   Resp 18   Wt 65.8 kg   SpO2 100%   BMI 20.81 kg/m  *** Physical Exam  ED Results and Treatments Labs (all labs ordered are listed, but only abnormal results are displayed) Labs Reviewed  CBC  PREGNANCY, URINE  COMPREHENSIVE METABOLIC PANEL  ETHANOL  SALICYLATE LEVEL  ACETAMINOPHEN LEVEL  RAPID URINE DRUG SCREEN, HOSP PERFORMED                                                                                                                         EKG  EKG Interpretation  Date/Time:    Ventricular Rate:    PR Interval:    QRS Duration:   QT Interval:    QTC Calculation:   R Axis:     Text Interpretation:         Radiology No results found.  Medications Ordered in ED Medications - No data to display  Procedures Procedures  (including critical care time)  Medical Decision Making / ED Course   Medical Decision Making Amount and/or Complexity of Data Reviewed Labs: ordered.          Final Clinical Impression(s) / ED Diagnoses Final diagnoses:  None    {Document critical care time when appropriate:1}  {Document review of labs and clinical  decision tools ie heart score, Chads2Vasc2 etc:1}  {Document your independent review of radiology images, and any outside records:1} {Document your discussion with family members, caretakers, and with consultants:1} {Document social determinants of health affecting pt's care:1} {Document your decision making why or why not admission, treatments were needed:1} This chart was dictated using voice recognition software.  Despite best efforts to proofread,  errors can occur which can change the documentation meaning.

## 2021-12-17 ENCOUNTER — Other Ambulatory Visit: Payer: Self-pay

## 2021-12-17 ENCOUNTER — Encounter (HOSPITAL_COMMUNITY): Payer: Self-pay | Admitting: Psychiatry

## 2021-12-17 ENCOUNTER — Inpatient Hospital Stay (HOSPITAL_COMMUNITY)
Admission: AD | Admit: 2021-12-17 | Discharge: 2021-12-17 | DRG: 885 | Disposition: A | Discharge status: Home / Self Care | Payer: Medicaid Other | Source: Other Acute Inpatient Hospital | Attending: Psychiatry | Admitting: Psychiatry

## 2021-12-17 DIAGNOSIS — Z20822 Contact with and (suspected) exposure to covid-19: Secondary | ICD-10-CM | POA: Diagnosis present

## 2021-12-17 DIAGNOSIS — F1090 Alcohol use, unspecified, uncomplicated: Secondary | ICD-10-CM

## 2021-12-17 DIAGNOSIS — Z79899 Other long term (current) drug therapy: Secondary | ICD-10-CM

## 2021-12-17 DIAGNOSIS — T39312A Poisoning by propionic acid derivatives, intentional self-harm, initial encounter: Secondary | ICD-10-CM | POA: Diagnosis present

## 2021-12-17 DIAGNOSIS — F17210 Nicotine dependence, cigarettes, uncomplicated: Secondary | ICD-10-CM | POA: Diagnosis present

## 2021-12-17 DIAGNOSIS — Z818 Family history of other mental and behavioral disorders: Secondary | ICD-10-CM

## 2021-12-17 DIAGNOSIS — F1490 Cocaine use, unspecified, uncomplicated: Secondary | ICD-10-CM | POA: Diagnosis present

## 2021-12-17 DIAGNOSIS — I10 Essential (primary) hypertension: Secondary | ICD-10-CM | POA: Diagnosis present

## 2021-12-17 DIAGNOSIS — F3181 Bipolar II disorder: Principal | ICD-10-CM | POA: Diagnosis present

## 2021-12-17 DIAGNOSIS — J45909 Unspecified asthma, uncomplicated: Secondary | ICD-10-CM | POA: Diagnosis present

## 2021-12-17 DIAGNOSIS — F329 Major depressive disorder, single episode, unspecified: Secondary | ICD-10-CM

## 2021-12-17 DIAGNOSIS — F411 Generalized anxiety disorder: Secondary | ICD-10-CM | POA: Diagnosis present

## 2021-12-17 DIAGNOSIS — T1491XA Suicide attempt, initial encounter: Secondary | ICD-10-CM | POA: Diagnosis present

## 2021-12-17 DIAGNOSIS — X838XXA Intentional self-harm by other specified means, initial encounter: Secondary | ICD-10-CM

## 2021-12-17 DIAGNOSIS — K219 Gastro-esophageal reflux disease without esophagitis: Secondary | ICD-10-CM | POA: Diagnosis present

## 2021-12-17 DIAGNOSIS — Z9884 Bariatric surgery status: Secondary | ICD-10-CM

## 2021-12-17 DIAGNOSIS — F1290 Cannabis use, unspecified, uncomplicated: Secondary | ICD-10-CM | POA: Diagnosis not present

## 2021-12-17 DIAGNOSIS — F909 Attention-deficit hyperactivity disorder, unspecified type: Secondary | ICD-10-CM | POA: Diagnosis present

## 2021-12-17 LAB — RESP PANEL BY RT-PCR (FLU A&B, COVID) ARPGX2
Influenza A by PCR: NEGATIVE
Influenza B by PCR: NEGATIVE
SARS Coronavirus 2 by RT PCR: NEGATIVE

## 2021-12-17 LAB — ETHANOL: Alcohol, Ethyl (B): 33 mg/dL — ABNORMAL HIGH (ref <10)

## 2021-12-17 LAB — ACETAMINOPHEN LEVEL
Acetaminophen (Tylenol), Serum: <10 ug/mL — ABNORMAL LOW (ref 10–30)
Acetaminophen (Tylenol), Serum: <10 ug/mL — ABNORMAL LOW (ref 10–30)

## 2021-12-17 LAB — SALICYLATE LEVEL: Salicylate Lvl: <7 mg/dL — ABNORMAL LOW (ref 7.0–30.0)

## 2021-12-17 MED ORDER — CITALOPRAM HYDROBROMIDE 20 MG PO TABS
20.0000 mg | ORAL_TABLET | Freq: Every day | ORAL | Status: DC
  Administered 2021-12-17: 20 mg via ORAL
  Filled 2021-12-17: qty 1

## 2021-12-17 MED ORDER — METOCLOPRAMIDE HCL 5 MG/ML IJ SOLN
INTRAMUSCULAR | Status: AC
  Filled 2021-12-17: qty 2

## 2021-12-17 MED ORDER — NICOTINE 21 MG/24HR TD PT24
21.0000 mg | MEDICATED_PATCH | Freq: Every day | TRANSDERMAL | Status: DC
  Filled 2021-12-17 (×2): qty 1

## 2021-12-17 MED ORDER — BUPROPION HCL ER (XL) 300 MG PO TB24
300.0000 mg | ORAL_TABLET | Freq: Every day | ORAL | Status: DC
  Administered 2021-12-17: 300 mg via ORAL
  Filled 2021-12-17: qty 1

## 2021-12-17 MED ORDER — LAMOTRIGINE 25 MG PO TABS
150.0000 mg | ORAL_TABLET | Freq: Every day | ORAL | Status: DC
  Administered 2021-12-17: 150 mg via ORAL
  Filled 2021-12-17: qty 2

## 2021-12-17 MED ORDER — NICOTINE 21 MG/24HR TD PT24: 21.0000 mg | MEDICATED_PATCH | Freq: Every day | TRANSDERMAL | 0 refills | Status: AC

## 2021-12-17 MED ORDER — POTASSIUM CHLORIDE CRYS ER 20 MEQ PO TBCR: 40.0000 meq | EXTENDED_RELEASE_TABLET | Freq: Once | ORAL | Status: DC
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
## 2021-12-17 NOTE — ED Notes (Signed)
Pt resting comfortably with husband at bedside.

## 2021-12-17 NOTE — Group Note (Unsigned)
[facility] LCSW Group Therapy Note   Group Date: 12/17/2021 Start Time: 1300 End Time: 1400   Type of Therapy/Topic:  Group Therapy:  Balance in Life  Participation Level:  {[facility] PARTICIPATION LEVEL:22264}   Description of Group:    This group will address the concept of balance and how it feels and looks when one is unbalanced. Patients will be encouraged to process areas in their lives that are out of balance, and identify reasons for remaining unbalanced. Facilitators will guide patients utilizing problem- solving interventions to address and correct the stressor making their life unbalanced. Understanding and applying boundaries will be explored and addressed for obtaining  and maintaining a balanced life. Patients will be encouraged to explore ways to assertively make their unbalanced needs known to significant others in their lives, using other group members and facilitator for support and feedback.  Therapeutic Goals: 1. Patient will identify two or more emotions or situations they have that consume much of in their lives. 2. Patient will identify signs/triggers that life has become out of balance:  3. Patient will identify two ways to set boundaries in order to achieve balance in their lives:  4. Patient will demonstrate ability to communicate their needs through discussion and/or role plays  Summary of Patient Progress:    ***    Therapeutic Modalities:   Cognitive Behavioral Therapy Solution-Focused Therapy Assertiveness Training   Ellah Otte S Bricyn Labrada, LCSW 

## 2021-12-17 NOTE — Progress Notes (Signed)
This is a 36 y/o Caucasian female of medium build, appearing stated age. She is dressed in hospital scrubs and hygiene appears adequate. Gait is steady, breathing unlabored, skin is warm and color is appropriate to ethnicity. denies somatic complaints. Pt. Currently wearing hand brace on right hand and IV device left antecubital. See skin assessment. Demeanor is pleasant and Pt. Is alert and oriented to all spheres, sitting at table. No postural or psychomotor abnormalities are noted. Mood is euthymic with congruent, full range of affect. Eye contact is appropriate. Speech is regular rate, rhythm and volume, prosody.Thought process is linear and goal directed. Denies SI/HI/AV hallucinations. Reality testing appears intact. Insight and judgment at present appear impaired related to patient's seeming minimization of her overdose attempt. Interaction is superficial and devoid of any discussion of current situation.  Pt. requested and received a meal and snacks as she indicated that she had not been provided with adequate nutrition in ed. IV was removed and patient was oriented to unit. Continue to monitor for safety.

## 2021-12-17 NOTE — Tx Team (Signed)
Initial Treatment Plan 12/17/2021 4:22 PM Vida Rigger RWE:[id number]    PATIENT STRESSORS: Financial difficulties   Health problems   Occupational concerns   Substance abuse   Traumatic event     PATIENT STRENGTHS: Ability for insight  Active sense of humor  Average or above average intelligence  Capable of independent living  Communication skills  General fund of knowledge  Motivation for treatment/growth  Supportive family/friends  Work skills    PATIENT IDENTIFIED PROBLEMS: "Anxiety"  "Depression"  "Substance abuse"  "Suicide thoughts occasionally"                DISCHARGE CRITERIA:  Ability to meet basic life and health needs Adequate post-discharge living arrangements Improved stabilization in mood, thinking, and/or behavior Medical problems require only outpatient monitoring Motivation to continue treatment in a less acute level of care Need for constant or close observation no longer present Reduction of life-threatening or endangering symptoms to within safe limits Safe-care adequate arrangements made Verbal commitment to aftercare and medication compliance  PRELIMINARY DISCHARGE PLAN: Attend aftercare/continuing care group Attend PHP/IOP Outpatient therapy Return to previous living arrangement Return to previous work or school arrangements  PATIENT/FAMILY INVOLVEMENT: This treatment plan has been presented to and reviewed with the patient, Amber Nielsen.  The patient and family have been given the opportunity to ask questions and make suggestions.  Amber Nielsen [address] [place], RN 12/17/2021, 4:22 PM

## 2021-12-17 NOTE — ED Notes (Signed)
TTS Completed.  Patient ambulated to restroom.  Patient alert and oriented X 4.  No further needs voiced at this time.

## 2021-12-17 NOTE — BH Assessment (Signed)
Clinician spoke to [facility] D. [place], RN to expressed the pt will be assessed by TTS during day shift.    Vertell Novak, [place], [facility], [facility] Triage Specialist [phone]

## 2021-12-17 NOTE — Group Note (Signed)
LCSW Group Therapy Note  Group Date: 12/17/2021 Start Time: 1300 End Time: 1345   Type of Therapy and Topic:  Group Therapy: Anger Cues and Responses  Participation Level:  Did Not Attend   Description of Group:   In this group, patients learned how to recognize the physical, cognitive, emotional, and behavioral responses they have to anger-provoking situations.  They identified a recent time they became angry and how they reacted.  They analyzed how their reaction was possibly beneficial and how it was possibly unhelpful.  The group discussed a variety of healthier coping skills that could help with such a situation in the future.  Focus was placed on how helpful it is to recognize the underlying emotions to our anger, because working on those can lead to a more permanent solution as well as our ability to focus on the important rather than the urgent.  Therapeutic Goals: Patients will remember their last incident of anger and how they felt emotionally and physically, what their thoughts were at the time, and how they behaved. Patients will identify how their behavior at that time worked for them, as well as how it worked against them. Patients will explore possible new behaviors to use in future anger situations. Patients will learn that anger itself is normal and cannot be eliminated, and that healthier reactions can assist with resolving conflict rather than worsening situations.  [place]   Marval Regal Malcom Selmer, LCSWA 12/17/2021  3:48 PM

## 2021-12-17 NOTE — ED Notes (Signed)
Attempted to call pt husband at the patients phone number per her request. Vm full.

## 2021-12-17 NOTE — ED Notes (Signed)
Husband at bedside. Pt is not suicidal or homicidal. No order from MD for 1:1 sitter at this time.

## 2021-12-17 NOTE — Consult Note (Signed)
Telepsych Consultation   Reason for Consult: Psych consult Referring Physician: Dr. Maylon Peppers Location of Patient: [facility] Location of Provider: [facility]  Patient Identification: Amber Nielsen MRN:  [id number] Principal Diagnosis: <principal problem not specified> Diagnosis:  Active Problems:   * No active hospital problems. *   Total Time spent with patient: 30 minutes  Subjective:   Amber Nielsen is a 36 y.o. female patient.  HPI: As Per [facility] ED initial intake notes: Amber Nielsen is a 36 y.o. female here after suicide attempt by overdosing on naproxen. She is unsure of how many pills were in the bottle, but reports that there were 10 left after she ingested the others. She did this approx 9:30 - 10p. She also drank alcohol tonight. She reports that  over the last 2-3 weeks she has felt "down" and "depressed." She has not missed any doses of her medication.   Assessment: Patient is seen and examined via telepsych sitting on her bed at the [facility] ED room.  Appears calm, with husband at the bedside.  Chart reviewed and findings shared with the treatment team and consult with Dr. Dwyane Dee.  Alert and oriented x 4, to person, time, place, and situation.  Maintained good eye contact with this provider, speech fluent with normal volume and pattern.  Presents with euthymic mood and eating some chips with the husband.  Affect congruent.  When asked what brought her to the hospital, patient reports everyone keeps saying I am suicidal.  I am not suicidal, I am depressed with a lot of stressors.  I am stressed because I do not have any job since August, I broke my right arm which is in a splint, and I feel like a burden and not being able to carry my own financial burden in the house.  Thought process congruent and thought content logical.  Sensorium with memory, judgment, and insight fair.  Abnormal labs of 12/16/2021 reviewed, CMP: Potassium level 3.4 low, replace with 40  mEq of potassium x 1, total protein 8.6 high, total bilirubin 0.2 low.  Other drugs: Acetaminophen less than 10, and salicylate less than 7.  Blood toxicology: Alcohol 33 high.  Urine drug screen: Positive for cocaine and tetrahydrocannabinol.  Instructions provided to patient on cessation of polysubstance usage due to the adverse effect on overall psychiatric and medical wellbeing.  Patient nodded and agreement.  EKG obtained today with the report indicated below: Vent. rate 110 BPM PR interval 164 ms QRS duration 102 ms QT/QTcB 354/479 ms P-R-T axes 79 -6 88 Sinus tachycardia Right atrial enlargement Inferior infarct , age undetermined Cannot rule out Anterior infarct , age undetermined T wave abnormality, consider lateral ischemia Abnormal ECG When compared with ECG of 16-Dec-2021 23:02, Minimal criteria for Anterior infarct are now Present Inferior infarct is now Present Nonspecific T wave abnormality now evident in Inferior leads ...  Denies suicidal ideation, homicidal ideation,  AVH, paranoia or delusion, denies access to firearms.  Denies self injurious behavior, denies drug use.  Reports sleeping over 8 hours last night and feeling restful.  Reported good appetite and drinking enough fluid for hydration. Reports drinking alcohol -beer none frequently, reports smoking cigarette 1 pack daily, reports taking her medication as scheduled, reports being followed by a therapy and psychiatrist at [facility] outpatient every 4 to 6 months, and denies family history of mental illness.  Disposition: Based on my examination of the patient, she does not meet the criteria for inpatient psychiatric admission at  this time.  Patient contracted with this provider for safety at home. Permission obtained from patient to speak with spouse at the bedside.  Both agreed for patient to return home and safety plan discussed with both patient and spouse to include: Warning signs of suicidal thoughts and feelings,  listing triggers of suicidal thoughts, coping strategies to calm or comfort patient, with list of activities that could help patient feel better, listing reasons for living, listing supportive system when felt overwhelmed, listing professional help and contact numbers, crisis hotlines for substance abuse and mental health therapist administration (SAMHSA) 1 800 suicide.  And National suicide prevention Lifeline 1 800 273 talk.  Encouraged  to make home environment safe by locking or harmful items and if feels suicidal to go to the nearest emergency room/call 911 to activate the EMS system.  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  Patient was admitted to [facility] with diagnosis of major depressive disorder, recurrent severe without psychotic features on February 02, 2016 to February 05, 2016.  Risk to Self:  Yes, but patient denies. Risk to Others:  No Prior Inpatient Therapy: Yes  Prior Outpatient Therapy:  Yes  Past Medical History:  Past Medical History:  Diagnosis Date   Acute pyelonephritis    Anxiety    Asthma    allergy related. rodent animals   Bipolar 1 disorder ([place])    Depression    H/O varicella    Headache(784.0)    History of gestational diabetes mellitus (GDM) 01/12/2017   History of pre-eclampsia 01/12/2017   HTN (hypertension)    IBS (irritable bowel syndrome)    Obesity    Pregnancy induced hypertension    Sepsis secondary to UTI ([place]) 08/26/2016   Ureteral stone with hydronephrosis 08/27/2016    Past Surgical History:  Procedure Laterality Date   COLONOSCOPY     CYSTOSCOPY W/ URETERAL STENT PLACEMENT Left 08/26/2016   Procedure: CYSTOSCOPY WITH RETROGRADE PYELOGRAM/URETERAL STENT PLACEMENT;  Surgeon: Franchot Gallo, MD;  Location: [place] [facility];  Service: Urology;  Laterality: Left;   CYSTOSCOPY  WITH RETROGRADE PYELOGRAM, URETEROSCOPY AND STENT PLACEMENT Left 09/14/2016   Procedure: CYSTOSCOPY WITH URETEROSCOPY , STONE EXTRACTION AND STENT REMOVAL;  Surgeon: Franchot Gallo, MD;  Location: [facility];  Service: Urology;  Laterality: Left;   GASTRIC BYPASS     TOOTH EXTRACTION     Family History:  Family History  Problem Relation Age of Onset   Heart disease Father    Heart attack Father    Nephrolithiasis Father    Colon polyps Father    Healthy Mother    Mental retardation Maternal Aunt    Diabetes Maternal Grandmother    Cancer Maternal Grandmother        ovarian and breast   Diabetes Maternal Grandfather    Diabetes Paternal Grandmother    Hypertension Paternal Grandmother    Heart disease Paternal Grandmother    Colon polyps Paternal Grandmother    Diabetes Paternal Grandfather    Colon polyps Paternal Uncle    Family Psychiatric  History: None indicated  Social History:  Social History   Substance and Sexual Activity  Alcohol Use Yes   Comment: socially     Social History   Substance and Sexual Activity  Drug Use Yes   Types: Marijuana   Comment: daily use or  smoke 1/8 weekly    Social History   Socioeconomic  History   Marital status: Married    Spouse name: Not on file   Number of children: 2   Years of education: Not on file   Highest education level: Not on file  Occupational History   Occupation: hair stylist  Tobacco Use   Smoking status: Every Day    Packs/day: 0.50    Types: Cigarettes   Smokeless tobacco: Never  Vaping Use   Vaping Use: Never used  Substance and Sexual Activity   Alcohol use: Yes    Comment: socially   Drug use: Yes    Types: Marijuana    Comment: daily use or  smoke 1/8 weekly   Sexual activity: Yes    Partners: Male    Birth control/protection: None  Other Topics Concern   Not on file  Social History Narrative   Not on file   Social Determinants of Health   Financial Resource Strain: Low Risk  (09/30/2020)    Overall Financial Resource Strain (CARDIA)    Difficulty of Paying Living Expenses: Not very hard  Food Insecurity: Food Insecurity Present (09/30/2020)   Hunger Vital Sign    Worried About Running Out of Food in the Last Year: Sometimes true    Ran Out of Food in the Last Year: Sometimes true  Transportation Needs: No Transportation Needs (09/30/2020)   PRAPARE - Hydrologist (Medical): No    Lack of Transportation (Non-Medical): No  Physical Activity: Inactive (09/30/2020)   Exercise Vital Sign    Days of Exercise per Week: 0 days    Minutes of Exercise per Session: 0 min  Stress: Stress Concern Present (09/30/2020)   [place]    Feeling of Stress : Very much  Social Connections: Socially Isolated (09/30/2020)   Social Connection and Isolation Panel [NHANES]    Frequency of Communication with Friends and Family: Never    Frequency of Social Gatherings with Friends and Family: Once a week    Attends Religious Services: Never    Marine scientist or Organizations: No    Attends Archivist Meetings: Never    Marital Status: Living with partner   Additional Social History:    Allergies:   Allergies  Allergen Reactions   Metformin And Related Diarrhea and Nausea And Vomiting    Other reaction(s): diarrhea   Celery Oil Swelling    Tongue swelling when eating celery   Cymbalta [Duloxetine Hcl] Other (See Comments)    "out of body"   Lamictal [Lamotrigine] Other (See Comments)    unknown   Prozac [Fluoxetine Hcl] Other (See Comments)    Anger problems    Labs:  Results for orders placed or performed during the hospital encounter of 12/16/21 (from the past 48 hour(s))  Comprehensive metabolic panel     Status: Abnormal   Collection Time: 12/16/21 11:17 PM  Result Value Ref Range   Sodium 140 135 - 145 mmol/L   Potassium 3.4 (L) 3.5 - 5.1 mmol/L   Chloride 100 98 -  111 mmol/L   CO2 27 22 - 32 mmol/L   Glucose, Bld 92 70 - 99 mg/dL    Comment: Glucose reference range applies only to samples taken after fasting for at least 8 hours.   BUN 8 6 - 20 mg/dL   Creatinine, Ser 0.78 0.44 - 1.00 mg/dL   Calcium 9.6 8.9 - 10.3 mg/dL   Total Protein 8.6 (H) 6.5 - 8.1  g/dL   Albumin 4.8 3.5 - 5.0 g/dL   AST 26 15 - 41 U/L   ALT 23 0 - 44 U/L   Alkaline Phosphatase 69 38 - 126 U/L   Total Bilirubin 0.2 (L) 0.3 - 1.2 mg/dL   GFR, Estimated >60 >60 mL/min    Comment: (NOTE) Calculated using the CKD-EPI Creatinine Equation (2021)    Anion gap 13 5 - 15    Comment: Performed at [facility], [address], [place], [place] [address]  Ethanol     Status: Abnormal   Collection Time: 12/16/21 11:17 PM  Result Value Ref Range   Alcohol, Ethyl (B) 33 (H) <10 mg/dL    Comment: (NOTE) Lowest detectable limit for serum alcohol is 10 mg/dL.  For medical purposes only. Performed at [facility], [address] [place], [place] [id number]   Salicylate level     Status: Abnormal   Collection Time: 12/16/21 11:17 PM  Result Value Ref Range   Salicylate Lvl [id number] (L) 7.0 - 30.0 mg/dL    Comment: Performed at [facility], [address] [place], [place] [address]  Acetaminophen level     Status: Abnormal   Collection Time: 12/16/21 11:17 PM  Result Value Ref Range   Acetaminophen (Tylenol), Serum <10 (L) 10 - 30 ug/mL    Comment: (NOTE) Therapeutic concentrations vary significantly. A range of 10-30 ug/mL  may be an effective concentration for many patients. However, some  are best treated at concentrations outside of this range. Acetaminophen concentrations >150 ug/mL at 4 hours after ingestion  and >50 ug/mL at 12 hours after ingestion are often associated with  toxic reactions.  Performed at [facility], [address] [place], [place] [address]   cbc     Status: None    Collection Time: 12/16/21 11:17 PM  Result Value Ref Range   WBC 10.4 4.0 - 10.5 K/uL   RBC 4.61 3.87 - 5.11 MIL/uL   Hemoglobin 14.5 12.0 - 15.0 g/dL   HCT 42.9 36.0 - 46.0 %   MCV 93.1 80.0 - 100.0 fL   MCH 31.5 26.0 - 34.0 pg   MCHC 33.8 30.0 - 36.0 g/dL   RDW 12.4 11.5 - 15.5 %   Platelets 397 150 - 400 K/uL   nRBC 0.0 0.0 - 0.2 %    Comment: Performed at [facility], [address], [place], [place] [address]  Rapid urine drug screen (hospital performed)     Status: Abnormal   Collection Time: 12/16/21 11:17 PM  Result Value Ref Range   Opiates NONE DETECTED NONE DETECTED   Cocaine POSITIVE (A) NONE DETECTED   Benzodiazepines NONE DETECTED NONE DETECTED   Amphetamines NONE DETECTED NONE DETECTED   Tetrahydrocannabinol POSITIVE (A) NONE DETECTED   Barbiturates NONE DETECTED NONE DETECTED    Comment: (NOTE) DRUG SCREEN FOR MEDICAL PURPOSES ONLY.  IF CONFIRMATION IS NEEDED FOR ANY PURPOSE, NOTIFY LAB WITHIN 5 DAYS.  LOWEST DETECTABLE LIMITS FOR URINE DRUG SCREEN Drug Class                     Cutoff (ng/mL) Amphetamine and metabolites    1000 Barbiturate and metabolites    200 Benzodiazepine                 200 Opiates and metabolites        300 Cocaine and metabolites        300 THC  50 Performed at [facility], [address] [place], [place] [address]   Pregnancy, urine     Status: None   Collection Time: 12/16/21 11:17 PM  Result Value Ref Range   Preg Test, Ur NEGATIVE NEGATIVE    Comment:        THE SENSITIVITY OF THIS METHODOLOGY IS >20 mIU/mL. Performed at [facility], [address] [place], [place] [address]   Acetaminophen level     Status: Abnormal   Collection Time: 12/17/21  3:53 AM  Result Value Ref Range   Acetaminophen (Tylenol), Serum <10 (L) 10 - 30 ug/mL    Comment: (NOTE) Therapeutic concentrations vary significantly. A range of 10-30 ug/mL  may be  an effective concentration for many patients. However, some  are best treated at concentrations outside of this range. Acetaminophen concentrations >150 ug/mL at 4 hours after ingestion  and >50 ug/mL at 12 hours after ingestion are often associated with  toxic reactions.  Performed at [facility], [address] [place], [place] [address]   Resp Panel by RT-PCR (Flu A&B, Covid) Anterior Nasal Swab     Status: None   Collection Time: 12/17/21  6:07 AM   Specimen: Anterior Nasal Swab  Result Value Ref Range   SARS Coronavirus 2 by RT PCR NEGATIVE NEGATIVE    Comment: (NOTE) SARS-CoV-2 target nucleic acids are NOT DETECTED.  The SARS-CoV-2 RNA is generally detectable in upper respiratory specimens during the acute phase of infection. The lowest concentration of SARS-CoV-2 viral copies this assay can detect is 138 copies/mL. A negative result does not preclude SARS-Cov-2 infection and should not be used as the sole basis for treatment or other patient management decisions. A negative result may occur with  improper specimen collection/handling, submission of specimen other than nasopharyngeal swab, presence of viral mutation(s) within the areas targeted by this assay, and inadequate number of viral copies(<138 copies/mL). A negative result must be combined with clinical observations, patient history, and epidemiological information. The expected result is Negative.  Fact Sheet for Patients:  BloggerCourse.com  Fact Sheet for Healthcare Providers:  SeriousBroker.it  This test is no t yet approved or cleared by the [place] FDA and  has been authorized for detection and/or diagnosis of SARS-CoV-2 by FDA under an Emergency Use Authorization (EUA). This EUA will remain  in effect (meaning this test can be used) for the duration of the COVID-19 declaration under Section 564(b)(1) of the Act, 21 U.S.C.section  360bbb-3(b)(1), unless the authorization is terminated  or revoked sooner.       Influenza A by PCR NEGATIVE NEGATIVE   Influenza B by PCR NEGATIVE NEGATIVE    Comment: (NOTE) The Xpert Xpress SARS-CoV-2/FLU/RSV plus assay is intended as an aid in the diagnosis of influenza from Nasopharyngeal swab specimens and should not be used as a sole basis for treatment. Nasal washings and aspirates are unacceptable for Xpert Xpress SARS-CoV-2/FLU/RSV testing.  Fact Sheet for Patients: BloggerCourse.com  Fact Sheet for Healthcare Providers: SeriousBroker.it  This test is not yet approved or cleared by the [place] FDA and has been authorized for detection and/or diagnosis of SARS-CoV-2 by FDA under an Emergency Use Authorization (EUA). This EUA will remain in effect (meaning this test can be used) for the duration of the COVID-19 declaration under Section 564(b)(1) of the Act, 21 U.S.C. section 360bbb-3(b)(1), unless the authorization is terminated or revoked.  Performed at [facility], [address] [place], [place] [address]  Medications:  Current Facility-Administered Medications  Medication Dose Route Frequency Provider Last Rate Last Admin   buPROPion (WELLBUTRIN XL) 24 hr tablet 300 mg  300 mg Oral Daily Cardama, Grayce Sessions, MD   300 mg at 12/17/21 1218   citalopram (CELEXA) tablet 20 mg  20 mg Oral Daily Cardama, Grayce Sessions, MD   20 mg at 12/17/21 1218   lamoTRIgine (LAMICTAL) tablet 150 mg  150 mg Oral Daily Cardama, Grayce Sessions, MD   150 mg at 12/17/21 1218   metoCLOPramide (REGLAN) 5 MG/ML injection            Current Outpatient Medications  Medication Sig Dispense Refill   albuterol (PROVENTIL HFA;VENTOLIN HFA) 108 (90 Base) MCG/ACT inhaler Inhale 2 puffs into the lungs every 6 (six) hours as needed for wheezing or shortness of breath.     buPROPion (WELLBUTRIN XL) 300 MG 24 hr tablet  TAKE 1 TABLET(300 MG) BY MOUTH DAILY 90 tablet 1   Calcium-Vitamin D-Vitamin K (CALCIUM + D) [phone] MG-UNT-MCG CHEW See admin instructions.     citalopram (CELEXA) 20 MG tablet Take 1 tablet (20 mg total) by mouth daily. 30 tablet 2   lamoTRIgine (LAMICTAL) 150 MG tablet TAKE 1 TABLET(150 MG) BY MOUTH DAILY 30 tablet 1   Multiple Vitamin (MULTIVITAMIN) tablet Take 1 tablet by mouth daily.     naproxen sodium (ALEVE) 220 MG tablet Take 220 mg by mouth. Pt took at least # 20     omeprazole (PRILOSEC) 40 MG capsule Take 40 mg by mouth 2 (two) times daily.     oxyCODONE (ROXICODONE) 5 MG immediate release tablet Take 1 tablet (5 mg total) by mouth every 4 (four) hours as needed for severe pain. 25 tablet 0   Vitamin D, Ergocalciferol, (DRISDOL) 1.25 MG (50000 UNIT) CAPS capsule Take 50,000 Units by mouth every 7 (seven) days.     atomoxetine (STRATTERA) 40 MG capsule Take 1 capsule (40 mg total) by mouth daily. (Patient not taking: Reported on 12/17/2021) 30 capsule 2   cetirizine (ZYRTEC ALLERGY) 10 MG tablet Take 1 tablet (10 mg total) by mouth daily. (Patient not taking: Reported on 12/17/2021) 30 tablet 0   clindamycin (CLEOCIN) 300 MG capsule Take 1 capsule (300 mg total) by mouth 3 (three) times daily. (Patient not taking: Reported on 12/17/2021) 30 capsule 0   pseudoephedrine (SUDAFED) 60 MG tablet Take 1 tablet (60 mg total) by mouth every 8 (eight) hours as needed for congestion. (Patient not taking: Reported on 12/17/2021) 30 tablet 0    Musculoskeletal: Strength & Muscle Tone: within normal limits Gait & Station: normal Patient leans: N/A  Psychiatric Specialty Exam:  Presentation  General Appearance:  Appropriate for Environment; Casual; Fairly Groomed  Eye Contact: Good  Speech: Clear and Coherent; Normal Rate  Speech Volume: Normal  Handedness: Right  Mood and Affect  Mood: Euthymic  Affect: Appropriate  Thought Process  Thought  Processes: Coherent  Descriptions of Associations:Intact  Orientation:Full (Time, Place and Person)  Thought Content:Logical  History of Schizophrenia/Schizoaffective disorder:No data recorded Duration of Psychotic Symptoms:No data recorded Hallucinations:Hallucinations: None  Ideas of Reference:None  Suicidal Thoughts:Suicidal Thoughts: No  Homicidal Thoughts:Homicidal Thoughts: No  Sensorium  Memory: Immediate Fair; Recent Fair; Remote Fair  Judgment: Fair  Insight: Fair  Community education officer  Concentration: Good  Attention Span: Good  Recall: Good  Fund of Knowledge: Fair  Language: Good  Psychomotor Activity  Psychomotor Activity: Psychomotor Activity: Normal  Assets  Assets: Communication Skills; Physical Health; Social  Support; Desire for Improvement  Sleep  Sleep: Sleep: Good Number of Hours of Sleep: 8  Physical Exam: Physical Exam Constitutional:      Appearance: Normal appearance.  HENT:     Head: Normocephalic.     Right Ear: External ear normal.     Left Ear: External ear normal.     Nose: Nose normal.     Mouth/Throat:     Mouth: Mucous membranes are moist.     Pharynx: Oropharynx is clear.  Eyes:     Extraocular Movements: Extraocular movements intact.     Conjunctiva/sclera: Conjunctivae normal.     Pupils: Pupils are equal, round, and reactive to light.  Cardiovascular:     Rate and Rhythm: Normal rate.     Pulses: Normal pulses.  Pulmonary:     Effort: Pulmonary effort is normal.  Abdominal:     Palpations: Abdomen is soft.  Genitourinary:    Comments: Deferred Musculoskeletal:        General: Normal range of motion.     Cervical back: Normal range of motion and neck supple.  Skin:    General: Skin is warm.  Neurological:     General: No focal deficit present.     Mental Status: She is alert and oriented to person, place, and time.  Psychiatric:        Mood and Affect: Mood normal.        Behavior: Behavior  normal.    Review of Systems  Constitutional: Negative.  Negative for chills and fever.  HENT: Negative.  Negative for ear pain, hearing loss and tinnitus.   Eyes: Negative.  Negative for blurred vision, double vision and photophobia.  Respiratory: Negative.  Negative for cough, sputum production, shortness of breath and wheezing.   Cardiovascular: Negative.  Negative for chest pain, palpitations and orthopnea.  Gastrointestinal: Negative.  Negative for abdominal pain, constipation, diarrhea, heartburn, nausea and vomiting.  Genitourinary: Negative.  Negative for dysuria, frequency and urgency.  Musculoskeletal: Negative.  Negative for back pain, myalgias and neck pain.  Skin: Negative.  Negative for itching and rash.  Neurological: Negative.  Negative for dizziness, tingling and headaches.  Endo/Heme/Allergies:  Negative for environmental allergies and polydipsia. Does not bruise/bleed easily.  Psychiatric/Behavioral:  Positive for substance abuse and suicidal ideas.    Blood pressure 110/62, pulse 87, temperature 98.2 F (36.8 C), temperature source Oral, resp. rate 14, weight 65.8 kg, SpO2 96 %. Body mass index is 20.81 kg/m.  Treatment Plan Summary: Daily contact with patient to assess and evaluate symptoms and progress in treatment and Medication management  Disposition: Patient does not meet criteria for psychiatric inpatient admission.  This service was provided via telemedicine using a 2-way, interactive audio and video technology.  Names of all persons participating in this telemedicine service and their role in this encounter. Name: Amber Nielsen Role: Patient  Name: Spero Geralds, NP Role: Prone vita  Name: Dr. Dwyane Dee Role: Medical Director  Name: Dr. Maylon Peppers Role:     [facility] physician    Laretta Bolster, [place] 12/17/2021 12:21 PM

## 2021-12-17 NOTE — ED Notes (Signed)
Pt given crackers. Husband at bedside.

## 2021-12-17 NOTE — ED Notes (Signed)
TTS to consult on day shift

## 2021-12-17 NOTE — ED Notes (Signed)
She is resting quietly and arouses easily for vital signs. She has no requests at this time.

## 2021-12-17 NOTE — ED Notes (Signed)
Her husband is visiting with her. Food and drink were provided per her request.

## 2021-12-17 NOTE — Plan of Care (Signed)
Pt. Engaged in admission process. Review and discuss program expectations, rules and available resources.  Pt. Verbalized understanding.
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
## 2021-12-17 NOTE — ED Notes (Signed)
Waiting on TTS

## 2021-12-17 NOTE — ED Notes (Signed)
Clean scrubs were provided as well as warm blanket.  Patient cooperative and pleasant. No other needs voiced at this time.

## 2021-12-17 NOTE — Progress Notes (Signed)
Pt. Reviewed resources available for outpatient treatment.  Indicated understanding and comfort with discharge plan. Escorted out to spouse who was waiting in parking lot.  Pt. Discharged without incident per physician order.

## 2021-12-17 NOTE — Group Note (Unsigned)
LCSW Group Therapy Note   Group Date: 12/17/2021 Start Time: 1300 End Time: 1400   Type of Therapy and Topic:  Group Therapy:   Participation Level:  {BHH PARTICIPATION LEVEL:22264}  Description of Group:   Therapeutic Goals:  1.     Summary of Patient Progress:    ***  Therapeutic Modalities:   Amber Nielsen, LCSWA 12/17/2021  3:43 PM    
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
## 2021-12-17 NOTE — Progress Notes (Signed)
Pt was accepted to [id number] TODAY 12/17/21  Dx: MDD.  Nursing please fax the voluntary to [phone]. Once received and the assessment is completed report can be called to [phone].  Pt meets inpatient criteria per Garrison Columbus, NP  Attending Physician will be Dr. Janine Limbo  Pt can arrive after: PENDING [place] Team notified: [place] Lynnda Shields, RN, Leonia Reader, RN, Dillon Bjork, RN, Garrison Columbus, FNP, Sena Hitch, RN  Nadara Mode, [place] 12/17/2021 @ 11:56 AM

## 2021-12-17 NOTE — H&P (Addendum)
Psychiatric Admission Assessment Adult  Patient Identification: Amber Nielsen MRN:  [id number] Date of Evaluation:  12/17/2021 Chief Complaint:  MDD (major depressive disorder) [F32.9] Principal Diagnosis: Bipolar II disorder (HCC) Diagnosis:  Principal Problem:   Bipolar II disorder (HCC) Active Problems:   MDD (major depressive disorder)  History of Present Illness:   Pt is a 36 yo F with a past psych h/o bipolar disorder who was admitted to the psychiatric unit for evaluation of suspected overdose on naproxen.  Of note, initially patient was apparently recommended for inpatient treatment, and then reevaluated today by psychiatry NP, who psych cleared the patient.  I evaluated the patient when she arrived to the unit today.  She reports that for the last 3 weeks her moods have been up and down, predominantly feeling depressed but also feeling irritable at times.  She reports she took a handful of naproxen earlier because she had a headache (later reported she had a headache because she used cocaine) but denies actually taking too much naproxen as a suicide attempt via overdose.  She reports that after she took the medication, she called for medics, who came to the house, left, the patient vomited.  Due to concern for gastric bypass the patient's husband took her to the emergency department where she was evaluated medically and psychiatrically.  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  She reports this is caused, but between her and her husband and also financial difficulties.  She reports feeling overwhelmed by this.  She reports feeling depressed off and on for last 3 weeks.  Reports feeling somewhat depressed at this time.  Reports some anhedonia.  Reports difficulty initiating sleep.  Denies changes in appetite.  Concentration is okay.  At this time she denies any suicidal  thoughts.  Denies HI.  She reports having hypomanic episodes off and on every few months, last occurring in September.  Denies any psychotic symptoms.  Reports having anxiety that is off and on.  Past psychiatric history: Bipolar disorder.  Denies history of suicide attempts.  Reports 1 hospitalization in 2017. Psychiatric medications: Lamotrigine 150 mg once daily, Wellbutrin 300 mg XL once daily,, Celexa.  Patient reports 7 out of 7 days per week adherence to this medication regimen.  Denies any side effects to his medications including skin changes rash or mucosal sores.  Substance use: Admits to using cocaine yesterday, alcohol, marijuana  Family history: Reports mother has bipolar disorder.  Patient unsure of any suicide attempts in the family.  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  He states that he does not believe this is a suicide attempt and she just took too much medication.  He has no safety concerns about her returning home.  He reports he will come pick up the patient today.  Safety planning was done by the NP to the emergency department.   See total Time spent with patient: 45 minutes   Is the patient at risk to self?  Low Has the patient been a risk to self in the past 6 months? No.  Has the patient been a risk to self within the distant past? No.  Is the patient a risk to others? No.  Has the patient  been a risk to others in the past 6 months? No.  Has the patient been a risk to others within the distant past? No.   [place] Scale:  Flowsheet Row Admission (Current) [facility] from 12/16/2021 in [facility] ED from 10/24/2021 in [facility]   C-SSRS RISK CATEGORY High Risk No Risk No Risk        Prior Inpatient Therapy:   Prior Outpatient Therapy:    Alcohol Screening:   Substance Abuse History in the last 12 months:  Yes.   Consequences of Substance Abuse: Negative Previous Psychotropic Medications: Yes  Psychological Evaluations: Yes  Past Medical History:  Past Medical History:  Diagnosis Date   Acute pyelonephritis    Anxiety    Asthma    allergy related. rodent animals   Bipolar 1 disorder (HCC)    Depression    H/O varicella    Headache(784.0)    History of gestational diabetes mellitus (GDM) 01/12/2017   History of pre-eclampsia 01/12/2017   HTN (hypertension)    IBS (irritable bowel syndrome)    Obesity    Pregnancy induced hypertension    Sepsis secondary to UTI (HCC) 08/26/2016   Ureteral stone with hydronephrosis 08/27/2016    Past Surgical History:  Procedure Laterality Date   COLONOSCOPY     CYSTOSCOPY W/ URETERAL STENT PLACEMENT Left 08/26/2016   Procedure: CYSTOSCOPY WITH RETROGRADE PYELOGRAM/URETERAL STENT PLACEMENT;  Surgeon: Marcine Matar, MD;  Location: [facility] OR;  Service: Urology;  Laterality: Left;   CYSTOSCOPY WITH RETROGRADE PYELOGRAM, URETEROSCOPY AND STENT PLACEMENT Left 09/14/2016   Procedure: CYSTOSCOPY WITH URETEROSCOPY , STONE EXTRACTION AND STENT REMOVAL;  Surgeon: Marcine Matar, MD;  Location: [facility] ORS;  Service: Urology;  Laterality: Left;   GASTRIC BYPASS     TOOTH EXTRACTION     Family History:  Family History  Problem Relation Age of Onset   Heart disease Father    Heart attack Father    Nephrolithiasis Father    Colon polyps Father    Healthy Mother    Mental retardation Maternal Aunt    Diabetes Maternal Grandmother    Cancer Maternal Grandmother        ovarian and breast   Diabetes Maternal Grandfather    Diabetes Paternal Grandmother    Hypertension Paternal Grandmother    Heart disease Paternal Grandmother    Colon polyps Paternal Grandmother    Diabetes  Paternal Grandfather    Colon polyps Paternal Uncle     Tobacco Screening:   Social History:  Social History   Substance and Sexual Activity  Alcohol Use Yes   Comment: one beer per month, socially     Social History   Substance and Sexual Activity  Drug Use Yes   Frequency: 7.0 times per week   Types: Marijuana   Comment: one joint every day    Additional Social History:                           Allergies:   Allergies  Allergen Reactions   Metformin And Related Diarrhea and Nausea And Vomiting    Other reaction(s): diarrhea   Celery Oil Swelling    Tongue swelling when eating celery   Cymbalta [Duloxetine Hcl] Other (See Comments)    "out of body"   Lamictal [Lamotrigine] Other (See Comments)    unknown   Prozac [Fluoxetine Hcl] Other (See Comments)  Anger problems   Lab Results:  Results for orders placed or performed during the hospital encounter of 12/16/21 (from the past 48 hour(s))  Comprehensive metabolic panel     Status: Abnormal   Collection Time: 12/16/21 11:17 PM  Result Value Ref Range   Sodium 140 135 - 145 mmol/L   Potassium 3.4 (L) 3.5 - 5.1 mmol/L   Chloride 100 98 - 111 mmol/L   CO2 27 22 - 32 mmol/L   Glucose, Bld 92 70 - 99 mg/dL    Comment: Glucose reference range applies only to samples taken after fasting for at least 8 hours.   BUN 8 6 - 20 mg/dL   Creatinine, Ser 0.78 0.44 - 1.00 mg/dL   Calcium 9.6 8.9 - 10.3 mg/dL   Total Protein 8.6 (H) 6.5 - 8.1 g/dL   Albumin 4.8 3.5 - 5.0 g/dL   AST 26 15 - 41 U/L   ALT 23 0 - 44 U/L   Alkaline Phosphatase 69 38 - 126 U/L   Total Bilirubin 0.2 (L) 0.3 - 1.2 mg/dL   GFR, Estimated >60 >60 mL/min    Comment: (NOTE) Calculated using the CKD-EPI Creatinine Equation (2021)    Anion gap 13 5 - 15    Comment: Performed at [facility], [address], [place], [place] [address]  Ethanol     Status: Abnormal   Collection Time: 12/16/21 11:17 PM  Result Value Ref  Range   Alcohol, Ethyl (B) 33 (H) <10 mg/dL    Comment: (NOTE) Lowest detectable limit for serum alcohol is 10 mg/dL.  For medical purposes only. Performed at [facility], [address] [place], [place] [address]   Salicylate level     Status: Abnormal   Collection Time: 12/16/21 11:17 PM  Result Value Ref Range   Salicylate Lvl <1.0 (L) 7.0 - 30.0 mg/dL    Comment: Performed at [facility], [address] [place], [place] [address]  Acetaminophen level     Status: Abnormal   Collection Time: 12/16/21 11:17 PM  Result Value Ref Range   Acetaminophen (Tylenol), Serum <10 (L) 10 - 30 ug/mL    Comment: (NOTE) Therapeutic concentrations vary significantly. A range of 10-30 ug/mL  may be an effective concentration for many patients. However, some  are best treated at concentrations outside of this range. Acetaminophen concentrations >150 ug/mL at 4 hours after ingestion  and >50 ug/mL at 12 hours after ingestion are often associated with  toxic reactions.  Performed at [facility], [address], [place], [place] [address]   cbc     Status: None   Collection Time: 12/16/21 11:17 PM  Result Value Ref Range   WBC 10.4 4.0 - 10.5 K/uL   RBC 4.61 3.87 - 5.11 MIL/uL   Hemoglobin 14.5 12.0 - 15.0 g/dL   HCT 42.9 36.0 - 46.0 %   MCV 93.1 80.0 - 100.0 fL   MCH 31.5 26.0 - 34.0 pg   MCHC 33.8 30.0 - 36.0 g/dL   RDW 12.4 11.5 - 15.5 %   Platelets 397 150 - 400 K/uL   nRBC 0.0 0.0 - 0.2 %    Comment: Performed at [facility], [address] [place], [place] [address]  Rapid urine drug screen (hospital performed)     Status: Abnormal   Collection Time: 12/16/21 11:17 PM  Result Value Ref Range   Opiates NONE DETECTED NONE DETECTED   Cocaine POSITIVE (A) NONE DETECTED  Benzodiazepines NONE DETECTED NONE DETECTED   Amphetamines NONE DETECTED NONE DETECTED   Tetrahydrocannabinol POSITIVE (A) NONE  DETECTED   Barbiturates NONE DETECTED NONE DETECTED    Comment: (NOTE) DRUG SCREEN FOR MEDICAL PURPOSES ONLY.  IF CONFIRMATION IS NEEDED FOR ANY PURPOSE, NOTIFY LAB WITHIN 5 DAYS.  LOWEST DETECTABLE LIMITS FOR URINE DRUG SCREEN Drug Class                     Cutoff (ng/mL) Amphetamine and metabolites    1000 Barbiturate and metabolites    200 Benzodiazepine                 200 Opiates and metabolites        300 Cocaine and metabolites        300 THC                            50 Performed at [facility], [address], [place], [place] [address]   Pregnancy, urine     Status: None   Collection Time: 12/16/21 11:17 PM  Result Value Ref Range   Preg Test, Ur NEGATIVE NEGATIVE    Comment:        THE SENSITIVITY OF THIS METHODOLOGY IS >20 mIU/mL. Performed at [facility], [address], [place], [place] [address]   Acetaminophen level     Status: Abnormal   Collection Time: 12/17/21  3:53 AM  Result Value Ref Range   Acetaminophen (Tylenol), Serum <10 (L) 10 - 30 ug/mL    Comment: (NOTE) Therapeutic concentrations vary significantly. A range of 10-30 ug/mL  may be an effective concentration for many patients. However, some  are best treated at concentrations outside of this range. Acetaminophen concentrations >150 ug/mL at 4 hours after ingestion  and >50 ug/mL at 12 hours after ingestion are often associated with  toxic reactions.  Performed at [facility], [address] [place], [place] [address]   Resp Panel by RT-PCR (Flu A&B, Covid) Anterior Nasal Swab     Status: None   Collection Time: 12/17/21  6:07 AM   Specimen: Anterior Nasal Swab  Result Value Ref Range   SARS Coronavirus 2 by RT PCR NEGATIVE NEGATIVE    Comment: (NOTE) SARS-CoV-2 target nucleic acids are NOT DETECTED.  The SARS-CoV-2 RNA is generally detectable in upper respiratory specimens during the acute phase of infection. The  lowest concentration of SARS-CoV-2 viral copies this assay can detect is 138 copies/mL. A negative result does not preclude SARS-Cov-2 infection and should not be used as the sole basis for treatment or other patient management decisions. A negative result may occur with  improper specimen collection/handling, submission of specimen other than nasopharyngeal swab, presence of viral mutation(s) within the areas targeted by this assay, and inadequate number of viral copies(<138 copies/mL). A negative result must be combined with clinical observations, patient history, and epidemiological information. The expected result is Negative.  Fact Sheet for Patients:  BloggerCourse.com  Fact Sheet for Healthcare Providers:  SeriousBroker.it  This test is no t yet approved or cleared by the [place] FDA and  has been authorized for detection and/or diagnosis of SARS-CoV-2 by FDA under an Emergency Use Authorization (EUA). This EUA will remain  in effect (meaning this test can be used) for the duration of the COVID-19 declaration under Section 564(b)(1) of the Act, 21 U.S.C.section 360bbb-3(b)(1), unless the authorization is terminated  or revoked  sooner.       Influenza A by PCR NEGATIVE NEGATIVE   Influenza B by PCR NEGATIVE NEGATIVE    Comment: (NOTE) The Xpert Xpress SARS-CoV-2/FLU/RSV plus assay is intended as an aid in the diagnosis of influenza from Nasopharyngeal swab specimens and should not be used as a sole basis for treatment. Nasal washings and aspirates are unacceptable for Xpert Xpress SARS-CoV-2/FLU/RSV testing.  Fact Sheet for Patients: BloggerCourse.com  Fact Sheet for Healthcare Providers: SeriousBroker.it  This test is not yet approved or cleared by the [place] FDA and has been authorized for detection and/or diagnosis of SARS-CoV-2 by FDA under an Emergency  Use Authorization (EUA). This EUA will remain in effect (meaning this test can be used) for the duration of the COVID-19 declaration under Section 564(b)(1) of the Act, 21 U.S.C. section 360bbb-3(b)(1), unless the authorization is terminated or revoked.  Performed at [facility], [address], [place], [place] [address]     Blood Alcohol level:  Lab Results  Component Value Date   ETH 33 (H) 12/16/2021   ETH <5 03/03/2016    Metabolic Disorder Labs:  Lab Results  Component Value Date   HGBA1C 6.3 (H) 01/12/2017   No results found for: "PROLACTIN" Lab Results  Component Value Date   CHOL 171 07/03/2013   TRIG 162 (H) 07/03/2013   HDL 45 07/03/2013   LDLCALC 94 07/03/2013    Current Medications: Current Facility-Administered Medications  Medication Dose Route Frequency Provider Last Rate Last Admin   nicotine (NICODERM CQ - dosed in mg/24 hours) patch 21 mg  21 mg Transdermal Daily Makale Pindell, MD       PTA Medications: Medications Prior to Admission  Medication Sig Dispense Refill Last Dose   albuterol (PROVENTIL HFA;VENTOLIN HFA) 108 (90 Base) MCG/ACT inhaler Inhale 2 puffs into the lungs every 6 (six) hours as needed for wheezing or shortness of breath.      atomoxetine (STRATTERA) 40 MG capsule Take 1 capsule (40 mg total) by mouth daily. (Patient not taking: Reported on 12/17/2021) 30 capsule 2    buPROPion (WELLBUTRIN XL) 300 MG 24 hr tablet TAKE 1 TABLET(300 MG) BY MOUTH DAILY 90 tablet 1    Calcium-Vitamin D-Vitamin K (CALCIUM + D) [phone] MG-UNT-MCG CHEW See admin instructions.      cetirizine (ZYRTEC ALLERGY) 10 MG tablet Take 1 tablet (10 mg total) by mouth daily. (Patient not taking: Reported on 12/17/2021) 30 tablet 0    citalopram (CELEXA) 20 MG tablet Take 1 tablet (20 mg total) by mouth daily. 30 tablet 2    clindamycin (CLEOCIN) 300 MG capsule Take 1 capsule (300 mg total) by mouth 3 (three) times daily. (Patient not  taking: Reported on 12/17/2021) 30 capsule 0    lamoTRIgine (LAMICTAL) 150 MG tablet TAKE 1 TABLET(150 MG) BY MOUTH DAILY 30 tablet 1    Multiple Vitamin (MULTIVITAMIN) tablet Take 1 tablet by mouth daily.      naproxen sodium (ALEVE) 220 MG tablet Take 220 mg by mouth. Pt took at least # 20      omeprazole (PRILOSEC) 40 MG capsule Take 40 mg by mouth 2 (two) times daily.      oxyCODONE (ROXICODONE) 5 MG immediate release tablet Take 1 tablet (5 mg total) by mouth every 4 (four) hours as needed for severe pain. 25 tablet 0    pseudoephedrine (SUDAFED) 60 MG tablet Take 1 tablet (60 mg total) by mouth every 8 (eight) hours as needed for congestion. (Patient  not taking: Reported on 12/17/2021) 30 tablet 0    Vitamin D, Ergocalciferol, (DRISDOL) 1.25 MG (50000 UNIT) CAPS capsule Take 50,000 Units by mouth every 7 (seven) days.       Musculoskeletal: Strength & Muscle Tone: within normal limits Gait & Station: normal Patient leans: N/A            Psychiatric Specialty Exam:  Presentation  General Appearance:  Casual  Eye Contact: Good  Speech: Normal Rate  Speech Volume: Normal  Handedness: Right   Mood and Affect  Mood: Anxious; Dysphoric  Affect: Congruent; Full Range   Thought Process  Thought Processes: Linear  Duration of Psychotic Symptoms: No data recorded Past Diagnosis of Schizophrenia or Psychoactive disorder: No data recorded Descriptions of Associations:Intact  Orientation:Full (Time, Place and Person)  Thought Content:Logical  Hallucinations:Hallucinations: None  Ideas of Reference:None  Suicidal Thoughts:Suicidal Thoughts: No  Homicidal Thoughts:Homicidal Thoughts: No   Sensorium  Memory: Immediate Good; Recent Good; Remote Good  Judgment: Fair  Insight: Fair   Art therapist  Concentration: Fair  Attention Span: Fair  Recall: Good  Fund of Knowledge: Fair  Language: Good   Psychomotor Activity   Psychomotor Activity: Psychomotor Activity: Normal   Assets  Assets: Communication Skills; Physical Health; Social Support; Desire for Improvement   Sleep  Sleep: Sleep: Fair Number of Hours of Sleep: 8    Physical Exam: Physical Exam Vitals reviewed.  Pulmonary:     Effort: Pulmonary effort is normal.  Neurological:     Mental Status: She is alert.     Motor: No weakness.     Gait: Gait normal.    Review of Systems  Psychiatric/Behavioral:  Positive for depression and substance abuse. Negative for hallucinations and suicidal ideas. The patient is nervous/anxious.    Blood pressure 111/74, pulse 75, temperature 98.9 F (37.2 C), temperature source Oral, resp. rate 16, height  (1.803 m), weight 65.8 kg, last menstrual period 11/16/2021, SpO2 99 %. Body mass index is 20.22 kg/m.  Treatment Plan Summary: Daily contact with patient to assess and evaluate symptoms and progress in treatment    ASSESSMENT:  Diagnoses / Active Problems: Bipolar disorder, type II, current episode is depressed Cocaine use Cannabis use Alcohol use   PLAN: Safety and Monitoring:  --  Voluntary admission to inpatient psychiatric unit for safety, stabilization and treatment   Safety planning done by NP in the emergency department.  I called for collateral with the patient, today.  Patient contracts for safety of her husband has no concerns for safety. Will discharge today. They does not require any medications as she already has medications at home.  She will follow-up at [facility] scheduled medication management.   2. Psychiatric Diagnoses and Treatment:   continue home medications Discharge    3. Medical Issues Being Addressed:     4. Discharge Planning:    Discharge today, safety planning done with husband by NP in the emergency department.  I also called the husband with the patient on speaker phone today, who has no safety concerns.  We discussed safety planning.  Patient  to follow-up with outpatient psychiatrist.  Does not require medications at discharge.  Observation Level/Precautions:  15 minute checks  Laboratory: See above  Psychotherapy:    Medications:    Consultations:    Discharge Concerns:    Estimated LOS: Discharged today  Other:     Physician Treatment Plan for Primary Diagnosis: Bipolar II disorder [facility])   Physician Treatment Plan for Secondary Diagnosis:  Principal Problem:   Bipolar II disorder (HCC) Active Problems:   MDD (major depressive disorder)   I certify that inpatient services furnished can reasonably be expected to improve the patient's condition.    Cristy HiltsNathan W Heide Brossart, MD 10/11/20234:39 PM   Total Time Spent in Direct Patient Care:  I personally spent 50 minutes on the unit in direct patient care. The direct patient care time included face-to-face time with the patient, reviewing the patient's chart, communicating with other professionals, and coordinating care. Greater than 50% of this time was spent in counseling or coordinating care with the patient regarding goals of hospitalization, psycho-education, and discharge planning needs.   Phineas InchesNathan Theophil Thivierge, MD Psychiatrist

## 2022-04-15 ENCOUNTER — Other Ambulatory Visit (HOSPITAL_COMMUNITY): Payer: Self-pay | Admitting: Physician Assistant

## 2022-04-15 DIAGNOSIS — F3181 Bipolar II disorder: Secondary | ICD-10-CM

## 2023-02-23 ENCOUNTER — Ambulatory Visit (INDEPENDENT_AMBULATORY_CARE_PROVIDER_SITE_OTHER): Payer: Medicaid Other

## 2023-02-23 DIAGNOSIS — F3181 Bipolar II disorder: Secondary | ICD-10-CM
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
## 2023-06-10 ENCOUNTER — Other Ambulatory Visit (HOSPITAL_COMMUNITY): Payer: Self-pay | Admitting: Internal Medicine

## 2023-06-10 DIAGNOSIS — R109 Unspecified abdominal pain: Secondary | ICD-10-CM

## 2023-06-11 ENCOUNTER — Ambulatory Visit (HOSPITAL_BASED_OUTPATIENT_CLINIC_OR_DEPARTMENT_OTHER)
Admission: RE | Admit: 2023-06-11 | Discharge: 2023-06-11 | Disposition: A | Discharge status: Home / Self Care | Source: Ambulatory Visit | Attending: Internal Medicine | Admitting: Internal Medicine

## 2023-06-11 DIAGNOSIS — R10A0 Flank pain, unspecified side: Secondary | ICD-10-CM

## 2023-06-11 DIAGNOSIS — R109 Unspecified abdominal pain: Secondary | ICD-10-CM | POA: Diagnosis present
